# Patient Record
Sex: Male | Born: 1965 | Race: White | Hispanic: No | Marital: Married | Smoking: Former smoker
Health system: Southern US, Community
[De-identification: ages and names within clinical notes are randomized; demographics above are authoritative.]

## PROBLEM LIST (undated history)

## (undated) DIAGNOSIS — N189 Chronic kidney disease, unspecified: Secondary | ICD-10-CM

## (undated) DIAGNOSIS — E785 Hyperlipidemia, unspecified: Secondary | ICD-10-CM

## (undated) DIAGNOSIS — E781 Pure hyperglyceridemia: Secondary | ICD-10-CM

## (undated) DIAGNOSIS — E291 Testicular hypofunction: Secondary | ICD-10-CM

## (undated) DIAGNOSIS — R748 Abnormal levels of other serum enzymes: Secondary | ICD-10-CM

## (undated) DIAGNOSIS — S82899A Other fracture of unspecified lower leg, initial encounter for closed fracture: Secondary | ICD-10-CM

## (undated) DIAGNOSIS — K760 Fatty (change of) liver, not elsewhere classified: Secondary | ICD-10-CM

## (undated) DIAGNOSIS — I10 Essential (primary) hypertension: Secondary | ICD-10-CM

## (undated) DIAGNOSIS — R7301 Impaired fasting glucose: Secondary | ICD-10-CM

## (undated) DIAGNOSIS — M549 Dorsalgia, unspecified: Secondary | ICD-10-CM

## (undated) HISTORY — DX: Impaired fasting glucose: R73.01

## (undated) HISTORY — DX: Dorsalgia, unspecified: M54.9

## (undated) HISTORY — PX: BACK SURGERY: SHX140

## (undated) HISTORY — DX: Chronic kidney disease, unspecified: N18.9

## (undated) HISTORY — DX: Hyperlipidemia, unspecified: E78.5

## (undated) HISTORY — DX: Other fracture of unspecified lower leg, initial encounter for closed fracture: S82.899A

## (undated) HISTORY — DX: Essential (primary) hypertension: I10

## (undated) HISTORY — DX: Abnormal levels of other serum enzymes: R74.8

## (undated) HISTORY — DX: Testicular hypofunction: E29.1

## (undated) HISTORY — DX: Fatty (change of) liver, not elsewhere classified: K76.0

---
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
## 2008-09-17 ENCOUNTER — Ambulatory Visit: Payer: Self-pay | Admitting: Chiropractic Medicine

## 2009-10-12 ENCOUNTER — Ambulatory Visit: Payer: Self-pay | Admitting: Unknown Physician Specialty

## 2009-10-18 ENCOUNTER — Ambulatory Visit: Payer: Self-pay | Admitting: Unknown Physician Specialty

## 2010-02-07 ENCOUNTER — Encounter: Payer: Self-pay | Admitting: Unknown Physician Specialty

## 2012-05-01 ENCOUNTER — Ambulatory Visit: Payer: Self-pay | Admitting: Internal Medicine

## 2012-05-01 LAB — DOT URINE DIP
Glucose,UR: NEGATIVE mg/dL (ref 0–75)
Protein: NEGATIVE

## 2012-09-08 ENCOUNTER — Ambulatory Visit: Payer: Self-pay

## 2014-04-07 ENCOUNTER — Ambulatory Visit: Payer: Self-pay | Admitting: Physician Assistant

## 2014-04-07 LAB — DOT URINE DIP
Blood: NEGATIVE
GLUCOSE, UR: NEGATIVE mg/dL (ref 0–75)
PROTEIN: NEGATIVE
Specific Gravity: 1.01 (ref 1.003–1.030)

## 2014-10-08 ENCOUNTER — Ambulatory Visit: Payer: Self-pay | Admitting: Family Medicine

## 2015-07-29 ENCOUNTER — Other Ambulatory Visit: Payer: Self-pay | Admitting: Family Medicine

## 2015-07-29 ENCOUNTER — Telehealth: Payer: Self-pay | Admitting: Family Medicine

## 2015-07-29 NOTE — Telephone Encounter (Signed)
Routing to provider  

## 2015-07-29 NOTE — Telephone Encounter (Signed)
Left message on pt vm to call us back and schedule a f/u with labs.

## 2015-07-29 NOTE — Telephone Encounter (Signed)
Please let [facility] Premo know that I'd like to see patient for an appointment here in the office for:  HTN, high cholesterol, prediabetes, etc. Please schedule a visit with me  in the next: two weeks Fasting?  YES please Thank you, Dr. Sanda Klein

## 2015-08-02 ENCOUNTER — Encounter: Payer: Self-pay | Admitting: Family Medicine

## 2015-08-02 ENCOUNTER — Ambulatory Visit (INDEPENDENT_AMBULATORY_CARE_PROVIDER_SITE_OTHER): Payer: BLUE CROSS/BLUE SHIELD | Admitting: Family Medicine

## 2015-08-02 VITALS — BP 171/95 | HR 103 | Temp 97.9°F | Ht 71.3 in | Wt 212.0 lb

## 2015-08-02 DIAGNOSIS — M542 Cervicalgia: Secondary | ICD-10-CM | POA: Diagnosis not present

## 2015-08-02 DIAGNOSIS — IMO0001 Reserved for inherently not codable concepts without codable children: Secondary | ICD-10-CM

## 2015-08-02 DIAGNOSIS — R030 Elevated blood-pressure reading, without diagnosis of hypertension: Secondary | ICD-10-CM

## 2015-08-02 MED ORDER — TRAMADOL HCL 50 MG PO TABS: 50.0000 mg | ORAL_TABLET | Freq: Four times a day (QID) | ORAL | Status: DC | PRN

## 2015-08-02 MED ORDER — CYCLOBENZAPRINE HCL 10 MG PO TABS: 10.0000 mg | ORAL_TABLET | Freq: Three times a day (TID) | ORAL | Status: DC | PRN

## 2015-08-02 NOTE — Patient Instructions (Signed)
Trapezius Palsy  with Rehab The trapezius is a large muscle of the upper back that helps to control the shoulder blade (scapula) and stabilize the spine. Trapezius palsy is a condition affecting the nervous system in the trapezius muscle. The condition results in pain and weakness in the back of the shoulder and upper back. Shoulder function is also decreased, because the scapula contains the socket for "ball and-socket" joint of the shoulder. Trapezius palsy is caused by injury to the spinal accessory nerve, which connects to the trapezius muscle. SYMPTOMS   Pain that is achy and in the shoulder and/or upper back.  Decreased shoulder function and/or strength.  Scapula protrudes (winging of the scapula).  Back pain when sitting in a chair with a hard back due to the scapula winging and placing pressure on the back of the chair.  Shrinkage (atrophy) of the trapezius muscle, this may or may not make the neckline look asymmetric.  Shoulder drooping. CAUSES  Trapezius palsy is caused by damage to the spinal accessory nerve, which is connected to the trapezius muscle. This condition is often associated with acromioclavicular [facility]) or sternoclavicular subluxation (adjacent bones becoming out of proper alignment, but the joint surfaces are still touching). Common mechanisms of injury include:  Direct trauma to the shoulder (like being hit with an object or falling on the shoulder).  Complication of previous surgery that causes nerve damage. RISK INCREASES WITH:  Contact sports (i.e., football, rugby, or lacrosse).  Surgery around the neck.  Poor strength and flexibility. PREVENTION   Warm up and stretch properly before activity.  Maintain physical fitness:  Strength, flexibility, and endurance.  Cardiovascular fitness. PROGNOSIS  Trapezius palsy usually resolves spontaneously within 3 to 6 months. Nonsurgical (conservative) treatments may help decrease the severity of symptoms.    RELATED COMPLICATIONS   Permanent nerve damage, including pain, numbness, tingling, or weakness.  Inability to compete at a high level.  Recurrent symptoms that result in a chronic problem.  Shoulder stiffness. TREATMENT Treatment initially involves resting from any activities that aggravate the symptoms, and the use of ice and medications to help reduce pain and inflammation. The use of strengthening and stretching exercises may help reduce pain with activity. These exercises may be performed at home or with referral to a therapist. A therapist may recommend further treatment, such as:  The use of electric current to simulate the nerves (transcutaneous electronic nerve stimulation [TENS]).  Ultrasound. If symptoms are severe, your caregiver may recommend you wear a brace to decrease discomfort. If symptoms persist for more than 6 months despite nonsurgical treatment, surgery may be recommended (uncommon). MEDICATION   If pain medication is necessary, then nonsteroidal anti-inflammatory medications, such as aspirin and ibuprofen, or other minor pain relievers, such as acetaminophen, are often recommended.  Do not take pain medication for 7 days before surgery.  Prescription pain relievers may be given if deemed necessary by your caregiver. Use only as directed and only as much as you need. HEAT AND COLD  Cold treatment (icing) relieves pain and reduces inflammation. Cold treatment should be applied for 10 to 15 minutes every 2 to 3 hours for inflammation and pain and immediately after any activity that aggravates your symptoms. Use ice packs or massage the area with a piece of ice (ice massage).  Heat treatment may be used prior to performing the stretching and strengthening activities prescribed by your caregiver, physical therapist, or athletic trainer. Use a heat pack or soak your injury in warm water. SEEK  MEDICAL CARE IF:  Treatment seems to offer no benefit, or the condition  worsens.  Any medications produce adverse side effects. EXERCISES RANGE OF MOTION (ROM) AND STRETCHING EXERCISES - Trapezius Palsy (Spinal Accessory Nerve Palsy) These exercises may help you when beginning to rehabilitate your injury. Your symptoms may resolve with or without further involvement from your physician, physical therapist or athletic trainer. While completing these exercises, remember:   Restoring tissue flexibility helps normal motion to return to the joints. This allows healthier, less painful movement and activity.  An effective stretch should be held for at least 30 seconds.  A stretch should never be painful. You should only feel a gentle lengthening or release in the stretched tissue. STRETCH - Flexion, Standing  Stand with good posture. With an underhand grip on your right / left and an overhand grip on the opposite hand, grasp a broomstick or cane so that your hands are a little more than shoulder-width apart.  Keeping your right / left elbow straight and shoulder muscles relaxed, push the stick with your opposite hand to raise your right / left arm in front of your body and then overhead. Raise your arm until you feel a stretch in your right / left shoulder, but before you have increased shoulder pain.  Try to avoid shrugging your right / left shoulder as your arm rises by keeping your shoulder blade tucked down and toward your mid-back spine. Hold __________ seconds.  Slowly return to the starting position. Repeat __________ times. Complete this exercise __________ times per day.  STRETCH - Abduction, Supine  Stand with good posture. With an underhand grip on your right / left and an overhand grip on the opposite hand, grasp a broomstick or cane so that your hands are a little more than shoulder-width apart.  Keeping your right / left elbow straight and shoulder muscles relaxed, push the stick with your opposite hand to raise your right / left arm out to the side of  your body and then overhead. Raise your arm until you feel a stretch in your right / left shoulder, but before you have increased shoulder pain.  Try to avoid shrugging your right / left shoulder as your arm rises by keeping your shoulder blade tucked down and toward your mid-back spine. Hold __________ seconds.  Slowly return to the starting position. Repeat __________ times. Complete this exercise __________ times per day.  ROM - Flexion, Active-Assisted  Lie on your back. You may bend your knees for comfort.  Grasp a broomstick or cane so your hands are about shoulder-width apart. Your right / left hand should grip the end of the stick/cane so that your hand is positioned "thumbs-up," as if you were about to shake hands.  Using your healthy arm to lead, raise your right / left arm overhead until you feel a gentle stretch in your shoulder. Hold __________ seconds.  Use the stick/cane to assist in returning your right / left arm to its starting position. Repeat __________ times. Complete this exercise __________ times per day.  STRETCH - External Rotation and Abduction  Stagger your stance through a doorframe. It does not matter which foot is forward.  Choose one of the following positions as instructed by your physician, physical therapist or athletic trainer: place your hands:  and forearms above your head and on the door frame.  and forearms at head-height and on the door frame.  at elbow-height and on the door frame.  Keeping your head and chest  upright and your stomach muscles tight to prevent over-extending your low-back, slowly shift your weight onto your front foot until you feel a stretch across your chest and/or in the front of your shoulders.  Hold __________ seconds. Shift your weight to your back foot to release the stretch. Repeat __________ times. Complete this stretch __________ times per day.  STRENGTHENING EXERCISES - Trapezius Palsy (Spinal Accessory Nerve  Palsy) These exercises may help you when beginning to rehabilitate your injury. They may resolve your symptoms with or without further involvement from your physician, physical therapist or athletic trainer. While completing these exercises, remember:   Muscles can gain both the endurance and the strength needed for everyday activities through controlled exercises.  Complete these exercises as instructed by your physician, physical therapist or athletic trainer. Progress with the resistance and repetition exercises only as your caregiver advises.  You may experience muscle soreness or fatigue, but the pain or discomfort you are trying to eliminate should never worsen during these exercises. If this pain does worsen, stop and make certain you are following the directions exactly. If the pain is still present after adjustments, discontinue the exercise until you can discuss the trouble with your clinician. STRENGTH - Scapular Depression and Adduction  With good posture, sit on a firm chair. Support your arms in front of you with pillows, arm rests or a table top. Have your elbows in line with the sides of your body.  Gently draw your shoulder blades down and toward your mid-back spine. Gradually increase the tension without tensing the muscles along the top of your shoulders and the back of your neck.  Hold for __________ seconds. Slowly release the tension and relax your muscles completely before completing the next repetition.  After you have practiced this exercise, remove the arm support and complete it while standing as well as sitting. Repeat __________ times. Complete this exercise __________ times per day.  STRENGTH - Shoulder Abductors, Isometric   With good posture, stand or sit about 4-6 inches from a wall with your right / left side facing the wall.  Bend your right / left elbow. Gently press your right / left elbow into the wall. Increase the pressure gradually until you are pressing  as hard as you can without shrugging your shoulder or increasing any shoulder discomfort.  Hold __________ seconds.  Release the tension slowly. Relax your shoulder muscles completely before you do the next repetition. Repeat __________ times. Complete this exercise __________ times per day.  STRENGTH - Shoulder Flexion, Isometric  With good posture and facing a wall, stand or sit about 4-6 inches away.  Keeping your right / left elbow straight, gently press the top of your fist into the wall. Increase the pressure gradually until you are pressing as hard as you can without shrugging your shoulder or increasing any shoulder discomfort.  Hold __________ seconds.  Release the tension slowly. Relax your shoulder muscles completely before you do the next repetition. Repeat __________ times. Complete this exercise __________ times per day.  STRENGTH - Internal Rotators  Secure a rubber exercise band/tubing to a fixed object so that it is at the same height as your right / left elbow when you are standing or sitting on a firm surface.  Stand or sit so that the secured exercise band/tubing is at your right / left side.  Bend your elbow 90 degrees. Place a folded towel or small pillow under your right / left arm so that your elbow is a  few inches away from your side.  Keeping the tension on the exercise band/tubing, pull it across your body toward your abdomen. Be sure to keep your body steady so that the movement is only coming from your shoulder rotating.  Hold __________ seconds. Release the tension in a controlled manner as you return to the starting position. Repeat __________ times. Complete this exercise __________ times per day.  STRENGTH - External Rotators  Secure a rubber exercise band/tubing to a fixed object so that it is at the same height as your right / left elbow when you are standing or sitting on a firm surface.  Stand or sit so that the secured exercise band/tubing is at  your side that is not injured.  Bend your elbow 90 degrees. Place a folded towel or small pillow under your right / left arm so that your elbow is a few inches away from your side.  Keeping the tension on the exercise band/tubing, pull it away from your body, as if pivoting on your elbow. Be sure to keep your body steady so that the movement is only coming from your shoulder rotating.  Hold __________ seconds. Release the tension in a controlled manner as you return to the starting position. Repeat __________ times. Complete this exercise __________ times per day.  STRENGTH - Shoulder Extensors  Secure a rubber exercise band/tubing so that it is at the height of your shoulders when you are either standing or sitting on a firm arm-less chair.  With a thumbs-up grip, grasp an end of the band/tubing in each hand. Straighten your elbows and lift your hands straight in front of you at shoulder height. Step back away from the secured end of band/tubing until it becomes tense.  Squeezing your shoulder blades together, pull your hands down to the sides of your thighs. Do not allow your hands to go behind you.  Hold for __________ seconds. Slowly ease the tension on the band/tubing as you reverse the directions and return to the starting position. Repeat __________ times. Complete this exercise __________ times per day.  STRENGTH - Shoulder Extensors, Prone  Lie on your stomach on a firm surface so that your right / left arm overhangs the edge. Rest your forehead on your opposite forearm. With your thumb facing away from your body and your elbow straight, hold a __________ weight in your hand.  Squeeze your right / left shoulder blade to your mid-back spine and then slowly raise your arm behind you to the height of the bed.  Hold for __________ seconds. Slowly reverse the directions and return to the starting position, controlling the weight as you lower your arm. Repeat __________ times. Complete  this exercise __________ times per day.  STRENGTH - Horizontal Abductors Choose one of the two oppositions to complete this exercise. Prone (lying on stomach):  Lie on your stomach on a firm surface so that your right / left arm overhangs the edge. Rest your forehead on your opposite forearm. With your palm facing the floor and your elbow straight, hold a __________ weight in your hand.  Squeeze your right / left shoulder blade to your mid-back spine and then slowly raise your arm to the height of the bed.  Hold for __________ seconds. Slowly reverse the directions and return to the starting position, controlling the weight as you lower your arm. Repeat __________ times. Complete this exercise __________ times per day. Standing:   Secure a rubber exercise band/tubing so that it is at the  height of your shoulders when you are either standing or sitting on a firm arm-less chair.  Grasp an end of the band/tubing in each hand and have your palms face each other. Straighten your elbows and lift your hands straight in front of you at shoulder height. Step back away from the secured end of band/tubing until it becomes tense.  Squeeze your shoulder blades together. Keeping your elbows locked and your hands at shoulder-height, bring your hands out to your side.  Hold __________ seconds. Slowly ease the tension on the band/tubing as you reverse the directions and return to the starting position. Repeat __________ times. Complete this exercise __________ times per day. STRENGTH - Scapular Retractors and Elevators  Secure a rubber exercise band/tubing so that it is at the height of your shoulders when you are either standing or sitting on a firm arm-less chair.  With a thumbs-up grip, grasp an end of the band/tubing in each hand. Step back away from the secured end of band/tubing until it becomes tense.  Squeezing your shoulder blades together, straighten your elbows and lift your hands straight over  your head.  Hold for __________ seconds. Slowly ease the tension on the band/tubing as you reverse the directions and return to the starting position. Repeat __________ times. Complete this exercise __________ times per day.  Document Released: 11/05/2005 Document Revised: 03/22/2014 Document Reviewed: 02/17/2009 [facility] Patient Information 2015 [place], [place]. This information is not intended to replace advice given to you by your health care provider. Make sure you discuss any questions you have with your health care provider.

## 2015-08-02 NOTE — Progress Notes (Signed)
BP 171/95 mmHg  Pulse 103  Temp(Src) 97.9 F (36.6 C)  Ht 5' 11.3" (1.811 m)  Wt 212 lb (96.163 kg)  BMI 29.32 kg/m2  SpO2 96%   Subjective:    Patient ID: Jesus Barber, male    DOB: July 25, 1966, 49 y.o.   MRN: [id number]  HPI: Jesus Barber is a 49 y.o. male  Chief Complaint  Patient presents with  . Neck Pain    patient has been in pain for 3 weeks,it is in his neck down his right arm, he has went to physical therapy which he believes caused this and a chiropractor nothing has helped. 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 Shoulder is better, neck hurting with pain down his arm. Saw a chiropractor who did electrostim, and the accupuncture/dry needling- didn't help at all Status: stable Treatments attempted: rest, ice, heat, APAP, ibuprofen, aleve and physical therapy for the arm, votaren gel  Compliant with recommended treatment: yes Relief with NSAIDs?:  no Location:Right Duration: 3 weeks Severity: severe Quality: sharp and aching Frequency: constant Radiation: R arm Aggravating factors: lifting, movement, walking, laying and bending Alleviating factors: nothing Weakness:  yes Paresthesias / decreased sensation:  yes  Fevers:  no  Relevant past medical, surgical, family and social history reviewed and updated as indicated. Interim medical history since our last visit reviewed. Allergies and medications reviewed and updated.  Review of Systems  Constitutional: Negative.   Respiratory: Negative.   Cardiovascular: Negative.   Musculoskeletal: Positive for myalgias and neck pain. Negative for back pain, joint swelling,  arthralgias and gait problem.  Psychiatric/Behavioral: Negative.     Per HPI unless specifically indicated above     Objective:    BP 171/95 mmHg  Pulse 103  Temp(Src) 97.9 F (36.6 C)  Ht 5' 11.3" (1.811 m)  Wt 212 lb (96.163 kg)  BMI 29.32 kg/m2  SpO2 96%  Wt Readings from Last 3 Encounters:  08/02/15 212 lb (96.163 kg)  12/31/14 214 lb (97.07 kg)    Physical Exam  Constitutional: He is oriented to person, place, and time. He appears well-developed and well-nourished. No distress.  HENT:  Head: Normocephalic and atraumatic.  Right Ear: Hearing normal.  Left Ear: Hearing normal.  Nose: Nose normal.  Eyes: Conjunctivae and lids are normal. Right eye exhibits no discharge. Left eye exhibits no discharge. No scleral icterus.  Cardiovascular: Normal rate, regular rhythm, normal heart sounds and intact distal pulses.  Exam reveals no gallop and no friction rub.   No murmur heard. Pulmonary/Chest: Effort normal and breath sounds normal. No respiratory distress. He has no wheezes. He has no rales. He exhibits no tenderness.  Neurological: He is alert and oriented to person, place, and time.  Skin: Skin is intact. No rash noted.  Psychiatric: He has a normal mood and affect. His speech is normal and behavior is normal. Judgment and thought content normal. Cognition and memory are normal.  Nursing note and vitals reviewed.  Neck Exam:    Tenderness to Palpation: yes    Midline cervical spine: no    Paraspinal neck musculature: no  Trapezius: yes    Sternocleidomastoid: no     Range of Motion:     Flexion: Decreased    Extension: Normal    Lateral rotation: Decreased  To the R    Lateral bending: Decreased to the R     Neuro Examination: Upper extremity DTRs normal & symmetric.  Strength and sensation intact.    Special Tests:     Spurling test: negativve  Results for orders placed or performed in visit on 04/07/14  DOT Urine Dip  Result Value Ref Range   Specific  Gravity 1.010 1.003-1.030   Glucose,UR NEGATIVE 0-75 mg/dL   Protein NEGATIVE NEGATIVE   Blood NEGATIVE NEGATIVE      Assessment & Plan:   Problem List Items Addressed This Visit    None    Visit Diagnoses    Neck pain on right side    -  Primary    Appears to be muscle spasm of trap and rhomboid- will start flexeril and PRN tramadol for breakthrough. Stretches given. Call if not getting better in a week.    Elevated blood pressure        Likely due to pain. Due for his regular appointment shortly, follow up on BP at that time (1 week). Work on decreasing salt.        Follow up plan: Return As scheduled.

## 2015-08-03 ENCOUNTER — Other Ambulatory Visit: Payer: Self-pay

## 2015-08-03 NOTE — Telephone Encounter (Signed)
Called and let patient know what Dr. Sanda Klein said.

## 2015-08-03 NOTE — Telephone Encounter (Signed)
Let patient know that we'll stop this for now; new recommendations about using this medicine with statin We'll discuss what's best for him at appt

## 2015-08-03 NOTE — Telephone Encounter (Signed)
Patient has appointment with Dr.Lada on 08/12/15 and pharmacy is [facility] in [place].

## 2015-08-09 ENCOUNTER — Other Ambulatory Visit: Payer: Self-pay

## 2015-08-09 NOTE — Telephone Encounter (Signed)
Let patient know that I'm stopping his fibric acid derivative, the fenofibrate (the cholesterol medicine that is not atorvastatin, the other one) We'll talk about why at his appt Come fasting for labs please

## 2015-08-09 NOTE — Telephone Encounter (Signed)
Left message to call.

## 2015-08-11 NOTE — Telephone Encounter (Signed)
Left message to call.

## 2015-08-12 ENCOUNTER — Encounter: Payer: Self-pay | Admitting: Family Medicine

## 2015-08-12 ENCOUNTER — Ambulatory Visit (INDEPENDENT_AMBULATORY_CARE_PROVIDER_SITE_OTHER): Payer: BLUE CROSS/BLUE SHIELD | Admitting: Family Medicine

## 2015-08-12 VITALS — BP 142/94 | HR 102 | Temp 98.5°F | Ht 71.75 in | Wt 211.0 lb

## 2015-08-12 DIAGNOSIS — I10 Essential (primary) hypertension: Secondary | ICD-10-CM

## 2015-08-12 DIAGNOSIS — E291 Testicular hypofunction: Secondary | ICD-10-CM

## 2015-08-12 DIAGNOSIS — E785 Hyperlipidemia, unspecified: Secondary | ICD-10-CM | POA: Insufficient documentation

## 2015-08-12 DIAGNOSIS — E663 Overweight: Secondary | ICD-10-CM | POA: Insufficient documentation

## 2015-08-12 DIAGNOSIS — R740 Nonspecific elevation of levels of transaminase and lactic acid dehydrogenase [LDH]: Secondary | ICD-10-CM | POA: Diagnosis not present

## 2015-08-12 DIAGNOSIS — N289 Disorder of kidney and ureter, unspecified: Secondary | ICD-10-CM | POA: Diagnosis not present

## 2015-08-12 DIAGNOSIS — M25511 Pain in right shoulder: Secondary | ICD-10-CM | POA: Insufficient documentation

## 2015-08-12 DIAGNOSIS — R7401 Elevation of levels of liver transaminase levels: Secondary | ICD-10-CM | POA: Insufficient documentation

## 2015-08-12 DIAGNOSIS — R7989 Other specified abnormal findings of blood chemistry: Secondary | ICD-10-CM

## 2015-08-12 MED ORDER — AMLODIPINE BESYLATE 5 MG PO TABS: 5.0000 mg | ORAL_TABLET | Freq: Every day | ORAL | Status: DC

## 2015-08-12 MED ORDER — CYCLOBENZAPRINE HCL 10 MG PO TABS: 10.0000 mg | ORAL_TABLET | Freq: Three times a day (TID) | ORAL | Status: DC | PRN

## 2015-08-12 MED ORDER — TRAMADOL HCL 50 MG PO TABS: 50.0000 mg | ORAL_TABLET | Freq: Four times a day (QID) | ORAL | Status: DC | PRN

## 2015-08-12 NOTE — Progress Notes (Signed)
BP 142/94 mmHg  Pulse 102  Temp(Src) 98.5 F (36.9 C)  Ht 5' 11.75" (1.822 m)  Wt 211 lb (95.709 kg)  BMI 28.83 kg/m2  SpO2 96%   Subjective:    Patient ID: Jesus Barber, male    DOB: 04-14-1966, 49 y.o.   MRN: [id number]  HPI: Jesus Barber is a 49 y.o. male  Chief Complaint  Patient presents with  . Hyperlipidemia    Patient was just notified this morning to discontinue his fenofibrate.   high cholesterol; just stopped the fenofibrate at my recommendations; eats 5-6 eggs a week; eats a lot of fast food with his job; not planning and making his health priority one; he could take stuff to work  High blood pressure; BP has been high for a while; used to be following that, was put on medicine several years ago and it wiped him out  Overweight; lost 3 pounds since last visit; was walking and jogging a little until he hurt his right shoulder  He saw Dr. Wynetta Emery for pinched nerve, shoulder pain; some numbness in the right UE; medicine is helping some; doing exercises  Low testosterone; we reviewed old labs and he was having fatigue at that time and it was checked; he was also on atenolol though and he quit taking that; he denies testicular atrophy, muscle weakness (with the exception of the right shoulder problem)  Relevant past medical, surgical, family and social history reviewed and updated as indicated. Interim medical history since our last visit reviewed. Allergies and medications reviewed and updated.  Review of Systems  Per HPI unless specifically indicated above     Objective:    BP 142/94 mmHg  Pulse 102  Temp(Src) 98.5 F (36.9 C)  Ht 5' 11.75" (1.822 m)  Wt 211 lb (95.709 kg)  BMI 28.83 kg/m2  SpO2 96%  Wt Readings from Last 3 Encounters:  08/12/15 211 lb (95.709 kg)  08/02/15 212 lb (96.163 kg)  12/31/14 214 lb (97.07 kg)    Physical Exam  Constitutional: He appears well-developed and well-nourished. No distress.  HENT:  Head:  Normocephalic and atraumatic.  Eyes: EOM are normal. No scleral icterus.  Neck: No thyromegaly present.  Cardiovascular: Normal rate and regular rhythm.   Pulmonary/Chest: Effort normal and breath sounds normal.  Abdominal: Soft. Bowel sounds are normal. He exhibits no distension.  Musculoskeletal: He exhibits no edema.  Neurological: Coordination normal.  Skin: Skin is warm and dry. No pallor.  Psychiatric: He has a normal mood and affect. His behavior is normal. Judgment and thought content normal.      Assessment & Plan:   Problem List Items Addressed This Visit      Cardiovascular and Mediastinum   Essential hypertension, benign - Primary    Reviewed prior BPs; discussed HTN; he agrees to start medicine; ideally, he will work on weight loss and DASH guidelines and we can perhaps discontinue this medicine over the coming months as his BMI comes down; limit salt as part of overall DASH program; see AVS      Relevant Medications   amLODipine (NORVASC) 5 MG tablet   Other Relevant Orders   Comprehensive metabolic panel (Completed)     Genitourinary   Renal insufficiency    Recheck creatinine and GFR        Other   Dyslipidemia    Check fasting lipids today; discussed risk of taking both statin plus fibric acid derivative; will plan on care once labs are back,  but for now he'll hold the fibric acid derivative; weight loss and healthy diet encouraged      Relevant Orders   Lipid Panel w/o Chol/HDL Ratio (Completed)   Overweight (BMI 25.0-29.9)    Encouraged weight loss      Elevated serum glutamic pyruvic transaminase (SGPT) level    Recheck level; likely related to cholesterol and weight      Shoulder pain, right    Followed by Dr. Wynetta Emery      Low serum testosterone level    Patient was not interested in checking level today; relatively asymptomatic         Follow up plan: Return in about 3 months (around 11/11/2015) for cholesterol, blood pressure, please  come fasting.  An after-visit summary was printed and given to the patient at [place].  Please see the patient instructions which may contain other information and recommendations beyond what is mentioned above in the assessment and plan.  Orders Placed This Encounter  Procedures  . Comprehensive metabolic panel  . Lipid Panel w/o Chol/HDL Ratio

## 2015-08-12 NOTE — Telephone Encounter (Signed)
Patient came in for follow up appointment. He was notified.

## 2015-08-12 NOTE — Patient Instructions (Addendum)
Try to limit eggs to no more than three per week Try to get more fiber and water (38 grams or more of fiber a day and 64 ounces of water) Start the new blood pressure medicine, amlodipine Return for visit and fasting labs in 3 months Start fish oil or krill oil, two capsules twice a day Really work on weight loss and healthier diet Check out the information at [employer] entitled "What It Takes to Lose Weight" Try to lose between 1-2 pounds per week by taking in fewer calories and burning off more calories You can succeed by limiting portions, limiting foods dense in calories and fat, becoming more active, and drinking 8 glasses of water a day Don't skip meals, especially breakfast, as skipping meals may alter your metabolism Do not use over-the-counter weight loss pills or gimmicks that claim rapid weight loss A healthy BMI (or body mass index) is between 18.5 and 24.9 You can calculate your ideal BMI at the [place] website ClubMonet[employer].fr Your goal blood pressure is less than 140 mmHg on top. Try to follow the DASH guidelines (DASH stands for Dietary Approaches to Stop Hypertension) Try to limit the sodium in your diet.  Ideally, consume less than 1.5 grams (less than 1,500mg ) per day. Do not add salt when cooking or at the table.  Check the sodium amount on labels when shopping, and choose items lower in sodium when given a choice. Avoid or limit foods that already contain a lot of sodium. Eat a diet rich in fruits and vegetables and whole grains.   DASH Eating Plan DASH stands for "Dietary Approaches to Stop Hypertension." The DASH eating plan is a healthy eating plan that has been shown to reduce high blood pressure (hypertension). Additional health benefits may include reducing the risk of type 2 diabetes mellitus, heart disease, and stroke. The DASH eating plan may also help with weight loss. WHAT DO I NEED TO KNOW ABOUT THE DASH  EATING PLAN? For the DASH eating plan, you will follow these general guidelines:  Choose foods with a percent daily value for sodium of less than 5% (as listed on the food label).  Use salt-free seasonings or herbs instead of table salt or sea salt.  Check with your health care provider or pharmacist before using salt substitutes.  Eat lower-sodium products, often labeled as "lower sodium" or "no salt added."  Eat fresh foods.  Eat more vegetables, fruits, and low-fat dairy products.  Choose whole grains. Look for the word "whole" as the first word in the ingredient list.  Choose fish and skinless chicken or [place] more often than red meat. Limit fish, poultry, and meat to 6 oz (170 g) each day.  Limit sweets, desserts, sugars, and sugary drinks.  Choose heart-healthy fats.  Limit cheese to 1 oz (28 g) per day.  Eat more home-cooked food and less restaurant, buffet, and fast food.  Limit fried foods.  Cook foods using methods other than frying.  Limit canned vegetables. If you do use them, rinse them well to decrease the sodium.  When eating at a restaurant, ask that your food be prepared with less salt, or no salt if possible. WHAT FOODS CAN I EAT? Seek help from a dietitian for individual calorie needs. Grains Whole grain or whole wheat bread. Brown rice. Whole grain or whole wheat pasta. Quinoa, bulgur, and whole grain cereals. Low-sodium cereals. Corn or whole wheat flour tortillas. Whole grain cornbread. Whole grain crackers. Low-sodium crackers. Vegetables Fresh or frozen vegetables (  raw, steamed, roasted, or grilled). Low-sodium or reduced-sodium tomato and vegetable juices. Low-sodium or reduced-sodium tomato sauce and paste. Low-sodium or reduced-sodium canned vegetables.  Fruits All fresh, canned (in natural juice), or frozen fruits. Meat and Other Protein Products Ground beef (85% or leaner), grass-fed beef, or beef trimmed of fat. Skinless chicken or [place].  Ground chicken or [place]. Pork trimmed of fat. All fish and seafood. Eggs. Dried beans, peas, or lentils. Unsalted nuts and seeds. Unsalted canned beans. Dairy Low-fat dairy products, such as skim or 1% milk, 2% or reduced-fat cheeses, low-fat ricotta or cottage cheese, or plain low-fat yogurt. Low-sodium or reduced-sodium cheeses. Fats and Oils Tub margarines without trans fats. Light or reduced-fat mayonnaise and salad dressings (reduced sodium). Avocado. Safflower, olive, or canola oils. Natural peanut or almond butter. Other Unsalted popcorn and pretzels. The items listed above may not be a complete list of recommended foods or beverages. Contact your dietitian for more options. WHAT FOODS ARE NOT RECOMMENDED? Grains White bread. White pasta. White rice. Refined cornbread. Bagels and croissants. Crackers that contain trans fat. Vegetables Creamed or fried vegetables. Vegetables in a cheese sauce. Regular canned vegetables. Regular canned tomato sauce and paste. Regular tomato and vegetable juices. Fruits Dried fruits. Canned fruit in light or heavy syrup. Fruit juice. Meat and Other Protein Products Fatty cuts of meat. Ribs, chicken wings, bacon, sausage, bologna, salami, chitterlings, fatback, hot dogs, bratwurst, and packaged luncheon meats. Salted nuts and seeds. Canned beans with salt. Dairy Whole or 2% milk, cream, half-and-half, and cream cheese. Whole-fat or sweetened yogurt. Full-fat cheeses or blue cheese. Nondairy creamers and whipped toppings. Processed cheese, cheese spreads, or cheese curds. Condiments Onion and garlic salt, seasoned salt, table salt, and sea salt. Canned and packaged gravies. Worcestershire sauce. Tartar sauce. Barbecue sauce. Teriyaki sauce. Soy sauce, including reduced sodium. Steak sauce. Fish sauce. Oyster sauce. Cocktail sauce. Horseradish. Ketchup and mustard. Meat flavorings and tenderizers. Bouillon cubes. Hot sauce. Tabasco sauce. Marinades. Taco  seasonings. Relishes. Fats and Oils Butter, stick margarine, lard, shortening, ghee, and bacon fat. Coconut, palm kernel, or palm oils. Regular salad dressings. Other Pickles and olives. Salted popcorn and pretzels. The items listed above may not be a complete list of foods and beverages to avoid. Contact your dietitian for more information. WHERE CAN I FIND MORE INFORMATION? National Heart, Lung, and Blood Institute: travelstabloid.com Document Released: 10/25/2011 Document Revised: 03/22/2014 Document Reviewed: 09/09/2013 [facility] Patient Information 2015 [place], [place]. This information is not intended to replace advice given to you by your health care provider. Make sure you discuss any questions you have with your health care provider. Cholesterol Cholesterol is a white, waxy, fat-like substance needed by your body in small amounts. The liver makes all the cholesterol you need. Cholesterol is carried from the liver by the blood through the blood vessels. Deposits of cholesterol (plaque) may build up on blood vessel walls. These make the arteries narrower and stiffer. Cholesterol plaques increase the risk for heart attack and stroke.  You cannot feel your cholesterol level even if it is very high. The only way to know it is high is with a blood test. Once you know your cholesterol levels, you should keep a record of the test results. Work with your health care provider to keep your levels in the desired range.  WHAT DO THE RESULTS MEAN?  Total cholesterol is a rough measure of all the cholesterol in your blood.   LDL is the so-called bad cholesterol. This is the type  that deposits cholesterol in the walls of the arteries. You want this level to be low.   HDL is the good cholesterol because it cleans the arteries and carries the LDL away. You want this level to be high.  Triglycerides are fat that the body can either burn for energy or store. High levels  are closely linked to heart disease.  WHAT ARE THE DESIRED LEVELS OF CHOLESTEROL?  Total cholesterol below 200.   LDL below 100 for people at risk, below 70 for those at very high risk.   HDL above 50 is good, above 60 is best.   Triglycerides below 150.  HOW CAN I LOWER MY CHOLESTEROL?  Diet. Follow your diet programs as directed by your health care provider.   Choose fish or white meat chicken and [place], roasted or baked. Limit fatty cuts of red meat, fried foods, and processed meats, such as sausage and lunch meats.   Eat lots of fresh fruits and vegetables.  Choose whole grains, beans, pasta, potatoes, and cereals.   Use only small amounts of olive, corn, or canola oils.   Avoid butter, mayonnaise, shortening, or palm kernel oils.  Avoid foods with trans fats.   Drink skim or nonfat milk and eat low-fat or nonfat yogurt and cheeses. Avoid whole milk, cream, ice cream, egg yolks, and full-fat cheeses.   Healthy desserts include angel food cake, ginger snaps, animal crackers, hard candy, popsicles, and low-fat or nonfat frozen yogurt. Avoid pastries, cakes, pies, and cookies.   Exercise. Follow your exercise programs as directed by your health care provider.   A regular program helps decrease LDL and raise HDL.   A regular program helps with weight control.   Do things that increase your activity level like gardening, walking, or taking the stairs. Ask your health care provider about how you can be more active in your daily life.   Medicine. Take medicine only as directed by your health care provider.   Medicine may be prescribed by your health care provider to help lower cholesterol and decrease the risk for heart disease.   If you have several risk factors, you may need medicine even if your levels are normal. Document Released: 07/31/2001 Document Revised: 03/22/2014 Document Reviewed: 08/19/2013 [facility] Patient Information 2015 [place], [place].  This information is not intended to replace advice given to you by your health care provider. Make sure you discuss any questions you have with your health care provider.

## 2015-08-13 LAB — COMPREHENSIVE METABOLIC PANEL
A/G RATIO: 2.2 (ref 1.1–2.5)
ALT: 86 IU/L — ABNORMAL HIGH (ref 0–44)
AST: 48 IU/L — AB (ref 0–40)
Albumin: 4.7 g/dL (ref 3.5–5.5)
Alkaline Phosphatase: 80 IU/L (ref 39–117)
BUN/Creatinine Ratio: 10 (ref 9–20)
BUN: 12 mg/dL (ref 6–24)
Bilirubin Total: 0.5 mg/dL (ref 0.0–1.2)
CALCIUM: 9.5 mg/dL (ref 8.7–10.2)
CO2: 21 mmol/L (ref 18–29)
Chloride: 104 mmol/L (ref 97–108)
Creatinine, Ser: 1.19 mg/dL (ref 0.76–1.27)
GFR, EST AFRICAN AMERICAN: 83 mL/min/{1.73_m2} (ref >59)
GFR, EST NON AFRICAN AMERICAN: 72 mL/min/{1.73_m2} (ref >59)
GLOBULIN, TOTAL: 2.1 g/dL (ref 1.5–4.5)
Glucose: 86 mg/dL (ref 65–99)
POTASSIUM: 4.5 mmol/L (ref 3.5–5.2)
SODIUM: 145 mmol/L — AB (ref 134–144)
Total Protein: 6.8 g/dL (ref 6.0–8.5)

## 2015-08-13 LAB — LIPID PANEL W/O CHOL/HDL RATIO
Cholesterol, Total: 236 mg/dL — ABNORMAL HIGH (ref 100–199)
HDL: 27 mg/dL — ABNORMAL LOW (ref >39)
TRIGLYCERIDES: 829 mg/dL — AB (ref 0–149)

## 2015-08-14 DIAGNOSIS — R7989 Other specified abnormal findings of blood chemistry: Secondary | ICD-10-CM | POA: Insufficient documentation

## 2015-08-14 NOTE — Assessment & Plan Note (Addendum)
Recheck level; likely related to cholesterol and weight

## 2015-08-14 NOTE — Assessment & Plan Note (Signed)
Check fasting lipids today; discussed risk of taking both statin plus fibric acid derivative; will plan on care once labs are back, but for now he'll hold the fibric acid derivative; weight loss and healthy diet encouraged

## 2015-08-14 NOTE — Assessment & Plan Note (Signed)
Encouraged weight loss 

## 2015-08-14 NOTE — Assessment & Plan Note (Signed)
Patient was not interested in checking level today; relatively asymptomatic

## 2015-08-14 NOTE — Assessment & Plan Note (Signed)
Recheck creatinine and GFR

## 2015-08-14 NOTE — Assessment & Plan Note (Signed)
Reviewed prior BPs; discussed HTN; he agrees to start medicine; ideally, he will work on weight loss and DASH guidelines and we can perhaps discontinue this medicine over the coming months as his BMI comes down; limit salt as part of overall DASH program; see AVS

## 2015-08-14 NOTE — Assessment & Plan Note (Signed)
Followed by Dr. Wynetta Emery

## 2015-08-19 NOTE — Telephone Encounter (Signed)
Patient has already been seen; I'm closing this note out

## 2015-08-22 ENCOUNTER — Telehealth: Payer: Self-pay | Admitting: Family Medicine

## 2015-08-22 MED ORDER — FISH OIL 1200 MG PO CAPS: 2.0000 | ORAL_CAPSULE | Freq: Two times a day (BID) | ORAL | Status: DC

## 2015-08-22 NOTE — Telephone Encounter (Signed)
I talked to patient; discussed FDA change in recommendations, no longer recommending fenofibrates with statins; will have him use fish oil 2 capsules BID and really work on weight loss and better eating; continue statin; mild elev of LFTs most likely fatty liver with his TG; recheck labs in 3 months, try to be good around holidays; glucose normal, kidney function good

## 2015-09-14 ENCOUNTER — Other Ambulatory Visit: Payer: Self-pay

## 2015-09-16 MED ORDER — ATORVASTATIN CALCIUM 40 MG PO TABS: 40.0000 mg | ORAL_TABLET | Freq: Every day | ORAL | Status: DC

## 2015-09-16 NOTE — Telephone Encounter (Signed)
Sent; using just statin now, not combination with fibric acid derivative

## 2015-09-16 NOTE — Telephone Encounter (Signed)
Rx fax request attempt # 2.   LAST VISIT: 08/12/2015 PATIENT HAS AN UPCOMING APPOINTMENT 11/15/2015  Request for Atorvastatin 40 mg tablet.

## 2015-11-09 ENCOUNTER — Other Ambulatory Visit: Payer: Self-pay | Admitting: Family Medicine

## 2015-11-09 DIAGNOSIS — R7401 Elevation of levels of liver transaminase levels: Secondary | ICD-10-CM

## 2015-11-09 DIAGNOSIS — R740 Nonspecific elevation of levels of transaminase and lactic acid dehydrogenase [LDH]: Secondary | ICD-10-CM

## 2015-11-09 DIAGNOSIS — E785 Hyperlipidemia, unspecified: Secondary | ICD-10-CM

## 2015-11-09 NOTE — Telephone Encounter (Signed)
Routing to provider  

## 2015-11-10 NOTE — Assessment & Plan Note (Signed)
Due for labs soon; one month Rx sent in

## 2015-11-10 NOTE — Telephone Encounter (Signed)
Rx sent; new orders entered for lipids and LFTs to be done next month

## 2015-11-10 NOTE — Assessment & Plan Note (Signed)
Recheck LFTs soon

## 2015-11-15 ENCOUNTER — Ambulatory Visit: Payer: BLUE CROSS/BLUE SHIELD | Admitting: Family Medicine

## 2015-12-02 ENCOUNTER — Encounter: Payer: Self-pay | Admitting: Family Medicine

## 2015-12-02 ENCOUNTER — Ambulatory Visit (INDEPENDENT_AMBULATORY_CARE_PROVIDER_SITE_OTHER): Payer: BLUE CROSS/BLUE SHIELD | Admitting: Family Medicine

## 2015-12-02 VITALS — BP 137/83 | HR 62 | Temp 97.6°F | Wt 215.0 lb

## 2015-12-02 DIAGNOSIS — N289 Disorder of kidney and ureter, unspecified: Secondary | ICD-10-CM

## 2015-12-02 DIAGNOSIS — E785 Hyperlipidemia, unspecified: Secondary | ICD-10-CM | POA: Diagnosis not present

## 2015-12-02 DIAGNOSIS — R740 Nonspecific elevation of levels of transaminase and lactic acid dehydrogenase [LDH]: Secondary | ICD-10-CM | POA: Diagnosis not present

## 2015-12-02 DIAGNOSIS — F1010 Alcohol abuse, uncomplicated: Secondary | ICD-10-CM

## 2015-12-02 DIAGNOSIS — R7401 Elevation of levels of liver transaminase levels: Secondary | ICD-10-CM

## 2015-12-02 DIAGNOSIS — I10 Essential (primary) hypertension: Secondary | ICD-10-CM

## 2015-12-02 NOTE — Assessment & Plan Note (Signed)
Check LFTs 

## 2015-12-02 NOTE — Progress Notes (Signed)
BP 137/83 mmHg  Pulse 62  Temp(Src) 97.6 F (36.4 C)  Wt 215 lb (97.523 kg)  SpO2 97%   Subjective:    Patient ID: Jesus Barber, male    DOB: 08-21-1966, 50 y.o.   MRN: [id number]  HPI: Jesus Barber is a 50 y.o. male  Chief Complaint  Patient presents with  . Hypertension    follow up, was started on BP med at last visit  . 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; he may do more advanced planning  He has high blood pressure and was started on a blood pressure medicine (CCB) last visit; no problems with the new blood rpessure medicine; he missed it for about a week; doing well, misses a dose here or there due to work schedule; no ankle or leg edema, no constipation; not using much salt; not changed eating habits like he should   Mildly elevated LFTs, likely fatty liver and high TG; he denies any risk factors for Hep B or C  He has been drinking more alcohol and wanted to bring that up; he thinks that is why maybe his last liver tests were abnormal; he used to drink here and there; sometimes gets a short fuse; snaps at things; drinking more than he used to now; sleeping well; not exercising enough; used to take a multivtamin until 2 weeks ago; wife asked him to talk about this; does not need counseling he thinks  Relevant past medical, surgical, family and social history reviewed and updated as indicated. Interim medical history since our last visit reviewed. Allergies and medications reviewed and updated.  Review of Systems Per HPI unless specifically indicated above     Objective:    BP 137/83 mmHg  Pulse 62  Temp(Src) 97.6 F (36.4 C)  Wt 215 lb  (97.523 kg)  SpO2 97%  Wt Readings from Last 3 Encounters:  12/02/15 215 lb (97.523 kg)  08/12/15 211 lb (95.709 kg)  08/02/15 212 lb (96.163 kg)    Today's Vitals   12/02/15 0924 12/02/15 0947  BP: 133/92 137/83  Pulse: 67 62  Temp: 97.6 F (36.4 C)   Weight: 215 lb (97.523 kg)   SpO2: 97%    Physical Exam  Constitutional: He appears well-developed and well-nourished. No distress.  HENT:  Head: Normocephalic and atraumatic.  Eyes: EOM are normal. No scleral icterus.  Neck: No thyromegaly present.  Cardiovascular: Normal rate and regular rhythm.   Pulmonary/Chest: Effort normal and breath sounds normal.  Abdominal: Soft. Bowel sounds are normal. He exhibits no distension.  Musculoskeletal: He exhibits no edema.  Neurological: Coordination normal.  Skin: Skin is warm and dry. No pallor.  Psychiatric: He has a normal mood and affect. His behavior is normal. Judgment and thought content normal.   Last creatinine Sept 23, 2016 Creatinine 1.19 SGOT 48 SGPT 86     Assessment & Plan:   Problem List Items Addressed This Visit      Cardiovascular and Mediastinum   Essential hypertension, benign    Fair control today; DASH guidelines; limit alcohol intake; modest weight loss; CCB      Relevant Medications   omega-3  acid ethyl esters (LOVAZA) 1 g capsule     Genitourinary   Renal insufficiency    Check creatinine        Other   Dyslipidemia    Check lipids today off of the fenofibrate; he is on statin alone; work on healthier eating; try to plan ahead when on the road; less fast food; modest weight loss      Relevant Medications   omega-3 acid ethyl esters (LOVAZA) 1 g capsule   Other Relevant Orders   Lipid Panel w/o Chol/HDL Ratio (Completed)   Elevated serum glutamic pyruvic transaminase (SGPT) level - Primary    Check LFTs      Relevant Orders   Hepatic function panel (Completed)   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  An after-visit summary was printed and given to the patient at [place].  Please see the patient instructions which may contain other information and recommendations beyond what is mentioned above in the assessment and plan.  Face-to-face time with patient was more than 25 minutes, >50% time spent counseling and coordination of care  Orders Placed This Encounter  Procedures  . Hepatic function panel  . Lipid Panel w/o Chol/HDL Ratio

## 2015-12-02 NOTE — Assessment & Plan Note (Addendum)
Check lipids today off of the fenofibrate; he is on statin alone; work on healthier eating; try to plan ahead when on the road; less fast food; modest weight loss

## 2015-12-02 NOTE — Patient Instructions (Addendum)
Alcohol Use Disorder Alcohol use disorder is a mental disorder. It is not a one-time incident of heavy drinking. Alcohol use disorder is the excessive and uncontrollable use of alcohol over time that leads to problems with functioning in one or more areas of daily living. People with this disorder risk harming themselves and others when they drink to excess. Alcohol use disorder also can cause other mental disorders, such as mood and anxiety disorders, and serious physical problems. People with alcohol use disorder often misuse other drugs.  Alcohol use disorder is common and widespread. Some people with this disorder drink alcohol to cope with or escape from negative life events. Others drink to relieve chronic pain or symptoms of mental illness. People with a family history of alcohol use disorder are at higher risk of losing control and using alcohol to excess.  Drinking too much alcohol can cause injury, accidents, and health problems. One drink can be too much when you are:  Working.  Pregnant or breastfeeding.  Taking medicines. Ask your doctor.  Driving or planning to drive. SYMPTOMS  Signs and symptoms of alcohol use disorder may include the following:   Consumption ofalcohol inlarger amounts or over a longer period of time than intended.  Multiple unsuccessful attempts to cutdown or control alcohol use.   A great deal of time spent obtaining alcohol, using alcohol, or recovering from the effects of alcohol (hangover).  A strong desire or urge to use alcohol (cravings).   Continued use of alcohol despite problems at work, school, or home because of alcohol use.   Continued use of alcohol despite problems in relationships because of alcohol use.  Continued use of alcohol in situations when it is physically hazardous, such as driving a car.  Continued use of alcohol despite awareness of a physical or psychological problem that is likely related to alcohol use. Physical  problems related to alcohol use can involve the brain, heart, liver, stomach, and intestines. Psychological problems related to alcohol use include intoxication, depression, anxiety, psychosis, delirium, and dementia.   The need for increased amounts of alcohol to achieve the same desired effect, or a decreased effect from the consumption of the same amount of alcohol (tolerance).  Withdrawal symptoms upon reducing or stopping alcohol use, or alcohol use to reduce or avoid withdrawal symptoms. Withdrawal symptoms include:  Racing heart.  Hand tremor.  Difficulty sleeping.  Nausea.  Vomiting.  Hallucinations.  Restlessness.  Seizures. DIAGNOSIS Alcohol use disorder is diagnosed through an assessment by your health care provider. Your health care provider may start by asking three or four questions to screen for excessive or problematic alcohol use. To confirm a diagnosis of alcohol use disorder, at least two symptoms must be present within a 47-monthperiod. The severity of alcohol use disorder depends on the number of symptoms:  Mild--two or three.  Moderate--four or five.  Severe--six or more. Your health care provider may perform a physical exam or use results from lab tests to see if you have physical problems resulting from alcohol use. Your health care provider may refer you to a mental health professional for evaluation. TREATMENT  Some people with alcohol use disorder are able to reduce their alcohol use to low-risk levels. Some people with alcohol use disorder need to quit drinking alcohol. When necessary, mental health professionals with specialized training in substance use treatment can help. Your health care provider can help you decide how severe your alcohol use disorder is and what type of treatment you need.  The following forms of treatment are available:   Detoxification. Detoxification involves the use of prescription medicines to prevent alcohol withdrawal  symptoms in the first week after quitting. This is important for people with a history of symptoms of withdrawal and for heavy drinkers who are likely to have withdrawal symptoms. Alcohol withdrawal can be dangerous and, in severe cases, cause death. Detoxification is usually provided in a hospital or in-patient substance use treatment facility.  Counseling or talk therapy. Talk therapy is provided by substance use treatment counselors. It addresses the reasons people use alcohol and ways to keep them from drinking again. The goals of talk therapy are to help people with alcohol use disorder find healthy activities and ways to cope with life stress, to identify and avoid triggers for alcohol use, and to handle cravings, which can cause relapse.  Medicines.Different medicines can help treat alcohol use disorder through the following actions:  Decrease alcohol cravings.  Decrease the positive reward response felt from alcohol use.  Produce an uncomfortable physical reaction when alcohol is used (aversion therapy).  Support groups. Support groups are run by people who have quit drinking. They provide emotional support, advice, and guidance. These forms of treatment are often combined. Some people with alcohol use disorder benefit from intensive combination treatment provided by specialized substance use treatment centers. Both inpatient and outpatient treatment programs are available.   This information is not intended to replace advice given to you by your health care provider. Make sure you discuss any questions you have with your health care provider.   Document Released: 12/13/2004 Document Revised: 11/26/2014 Document Reviewed: 02/12/2013 Elsevier Interactive Patient Education 2016 [employer].  ---------------------------- Jesus Barber get labs today and call you about that We'll start medicine after getting the results Your goal blood pressure is less than 140 mmHg on top and under 90 on the  bottom Try to follow the DASH guidelines (DASH stands for Dietary Approaches to Stop Hypertension) Try to limit the sodium in your diet.  Ideally, consume less than 1.5 grams (less than 1,500mg ) per day. Do not add salt when cooking or at the table.  Check the sodium amount on labels when shopping, and choose items lower in sodium when given a choice. Avoid or limit foods that already contain a lot of sodium. Eat a diet rich in fruits and vegetables and whole grains. Check out Moderate Drinking [email or web address]

## 2015-12-03 ENCOUNTER — Telehealth: Payer: Self-pay | Admitting: Family Medicine

## 2015-12-03 LAB — HEPATIC FUNCTION PANEL
ALT: 41 IU/L (ref 0–44)
AST: 25 IU/L (ref 0–40)
Albumin: 4.8 g/dL (ref 3.5–5.5)
Alkaline Phosphatase: 95 IU/L (ref 39–117)
BILIRUBIN TOTAL: 0.6 mg/dL (ref 0.0–1.2)
BILIRUBIN, DIRECT: 0.15 mg/dL (ref 0.00–0.40)
Total Protein: 7 g/dL (ref 6.0–8.5)

## 2015-12-03 LAB — LIPID PANEL W/O CHOL/HDL RATIO
CHOLESTEROL TOTAL: 263 mg/dL — AB (ref 100–199)
HDL: 28 mg/dL — ABNORMAL LOW (ref >39)
Triglycerides: 1691 mg/dL (ref 0–149)

## 2015-12-03 MED ORDER — ESCITALOPRAM OXALATE 10 MG PO TABS: 10.0000 mg | ORAL_TABLET | Freq: Every day | ORAL | Status: DC

## 2015-12-03 MED ORDER — OMEGA-3-ACID ETHYL ESTERS 1 G PO CAPS: 2.0000 g | ORAL_CAPSULE | Freq: Two times a day (BID) | ORAL | Status: DC

## 2015-12-03 NOTE — Telephone Encounter (Signed)
I called pt about labs The first time he remembers checking TG, there were unmeasurable, >2900 I explained they are >1600 now; start Lovaza 2 gm BID Recheck lipids here in the office fasting in 6 weeks He'll move appt from 4 weeks from now to 6 weeks Start lexapro; don't drink with this; call me before next appt if needed He agrees I'll mail something to help him with TG diet; limit fried foods, sweets, starches

## 2015-12-04 DIAGNOSIS — F1010 Alcohol abuse, uncomplicated: Secondary | ICD-10-CM | POA: Insufficient documentation

## 2015-12-04 NOTE — Assessment & Plan Note (Signed)
Check creatinine 

## 2015-12-04 NOTE — Assessment & Plan Note (Signed)
So glad patient wanted to address this; discussed possibly using alcohol as a coping mechanism for anxiety; referred him to Moderate Drinking website; see AVS; will check LFTs today and then consider starting SSRI to treat what sounds like underlying anxiety; do not drink with medicine; close f/u

## 2015-12-04 NOTE — Assessment & Plan Note (Signed)
Fair control today; DASH guidelines; limit alcohol intake; modest weight loss; CCB

## 2015-12-16 ENCOUNTER — Other Ambulatory Visit: Payer: Self-pay

## 2015-12-16 MED ORDER — AMLODIPINE BESYLATE 5 MG PO TABS: 5.0000 mg | ORAL_TABLET | Freq: Every day | ORAL | Status: DC

## 2015-12-16 NOTE — Telephone Encounter (Signed)
Rx approved

## 2015-12-16 NOTE — Telephone Encounter (Signed)
Routing to provider  

## 2015-12-19 ENCOUNTER — Other Ambulatory Visit: Payer: Self-pay

## 2015-12-19 MED ORDER — ATORVASTATIN CALCIUM 40 MG PO TABS: 40.0000 mg | ORAL_TABLET | Freq: Every day | ORAL | Status: DC

## 2015-12-19 NOTE — Telephone Encounter (Signed)
Routing to provider  

## 2015-12-19 NOTE — Telephone Encounter (Signed)
Last LFTs reviewed; Rx approved

## 2015-12-30 ENCOUNTER — Ambulatory Visit: Payer: BLUE CROSS/BLUE SHIELD | Admitting: Family Medicine

## 2016-01-13 ENCOUNTER — Other Ambulatory Visit: Payer: BLUE CROSS/BLUE SHIELD

## 2016-01-13 ENCOUNTER — Encounter: Payer: Self-pay | Admitting: Family Medicine

## 2016-01-13 ENCOUNTER — Other Ambulatory Visit: Payer: Self-pay | Admitting: Family Medicine

## 2016-01-13 ENCOUNTER — Ambulatory Visit (INDEPENDENT_AMBULATORY_CARE_PROVIDER_SITE_OTHER): Payer: BLUE CROSS/BLUE SHIELD | Admitting: Family Medicine

## 2016-01-13 VITALS — BP 150/102 | HR 80 | Temp 98.4°F | Ht 71.5 in | Wt 209.6 lb

## 2016-01-13 DIAGNOSIS — R740 Nonspecific elevation of levels of transaminase and lactic acid dehydrogenase [LDH]: Principal | ICD-10-CM

## 2016-01-13 DIAGNOSIS — N289 Disorder of kidney and ureter, unspecified: Secondary | ICD-10-CM

## 2016-01-13 DIAGNOSIS — R7401 Elevation of levels of liver transaminase levels: Secondary | ICD-10-CM

## 2016-01-13 DIAGNOSIS — S86811A Strain of other muscle(s) and tendon(s) at lower leg level, right leg, initial encounter: Secondary | ICD-10-CM | POA: Diagnosis not present

## 2016-01-13 DIAGNOSIS — F1010 Alcohol abuse, uncomplicated: Secondary | ICD-10-CM

## 2016-01-13 DIAGNOSIS — E785 Hyperlipidemia, unspecified: Secondary | ICD-10-CM

## 2016-01-13 DIAGNOSIS — F439 Reaction to severe stress, unspecified: Secondary | ICD-10-CM

## 2016-01-13 DIAGNOSIS — E663 Overweight: Secondary | ICD-10-CM

## 2016-01-13 DIAGNOSIS — I10 Essential (primary) hypertension: Secondary | ICD-10-CM

## 2016-01-13 DIAGNOSIS — M25511 Pain in right shoulder: Secondary | ICD-10-CM | POA: Diagnosis not present

## 2016-01-13 DIAGNOSIS — Z658 Other specified problems related to psychosocial circumstances: Secondary | ICD-10-CM

## 2016-01-13 DIAGNOSIS — S86111A Strain of other muscle(s) and tendon(s) of posterior muscle group at lower leg level, right leg, initial encounter: Secondary | ICD-10-CM

## 2016-01-13 DIAGNOSIS — S86119A Strain of other muscle(s) and tendon(s) of posterior muscle group at lower leg level, unspecified leg, initial encounter: Secondary | ICD-10-CM | POA: Insufficient documentation

## 2016-01-13 LAB — AST (SGOT) PICCOLO, WAIVED: AST (SGOT) Piccolo, Waived: 46 U/L — ABNORMAL HIGH (ref 11–38)

## 2016-01-13 LAB — ALT (SGPT) PICCOLO, WAIVED: ALT (SGPT) PICCOLO, WAIVED: 51 U/L — AB (ref 10–47)

## 2016-01-13 MED ORDER — ASPIRIN EC 81 MG PO TBEC: 81.0000 mg | DELAYED_RELEASE_TABLET | Freq: Every day | ORAL | Status: DC

## 2016-01-13 MED ORDER — AMLODIPINE BESYLATE 10 MG PO TABS: 10.0000 mg | ORAL_TABLET | Freq: Every day | ORAL | Status: DC

## 2016-01-13 MED ORDER — ESCITALOPRAM OXALATE 10 MG PO TABS: 15.0000 mg | ORAL_TABLET | Freq: Every day | ORAL | Status: DC

## 2016-01-13 NOTE — Assessment & Plan Note (Signed)
Will increase the escitalopram from 10 mg to 15 mg daily; recheck in 3 weeks

## 2016-01-13 NOTE — Progress Notes (Signed)
BP 150/102 mmHg  Pulse 80  Temp(Src) 98.4 F (36.9 C)  Ht 5' 11.5" (1.816 m)  Wt 209 lb 9.6 oz (95.074 kg)  BMI 28.83 kg/m2  SpO2 97%   Subjective:    Patient ID: Jesus Barber, male    DOB: [birth year], 50 y.o.   MRN: [id number]  HPI: Jesus Barber is a 50 y.o. male  Chief Complaint  Patient presents with  . Follow-up    4 wk F/U. Patient states everything is going good.   . Elevated SGPT    He has been taking BP medicine; no side effects; no ankle edema  He has not been taking the fish oil prescription (Lovaza), $97, and quit that and now taking OTC fish oil 1200 mg two pills twice a day, that's has been for a few weeks; had been on statin plus fenofibrate for years; he recalls earlier have really high TG (>2600)  Cutting back on alcohol; it was a crutch, and he's not had any problem cutting back  Sleeping okay; work schedule fluctuates and OTC sleep aides, like generic benadryl  Taking the escitalopram; he can tell it's doing something; not as easy to snap mood wise; no anger or violence; still has some days when he gets stressed, it is better but wonders if dose could go up as we talked about it  He tore his right calf at work about 10 days ago, using compression and elevation; not red or hot  Relevant past medical, surgical, family and social history reviewed and updated as indicated. Interim medical history since our last visit reviewed. Allergies and medications reviewed and updated.  Review of Systems Per HPI unless specifically indicated above     Objective:    BP 150/102 mmHg  Pulse 80  Temp(Src) 98.4 F (36.9 C)  Ht 5' 11.5" (1.816 m)  Wt 209 lb 9.6 oz (95.074 kg)  BMI 28.83 kg/m2  SpO2 97%  Wt Readings from Last 3 Encounters:  01/13/16 209 lb 9.6 oz (95.074 kg)  12/02/15 215 lb (97.523 kg)  08/12/15 211 lb (95.709 kg)    Today's Vitals   01/13/16 1022 01/13/16 1102  BP: 153/93 150/102  Pulse: 80   Temp: 98.4 F (36.9 C)   Height:  5' 11.5" (1.816 m)   Weight: 209 lb 9.6 oz (95.074 kg)   SpO2: 97%    Physical Exam  Constitutional: He appears well-developed and well-nourished. No distress.  HENT:  Head: Normocephalic and atraumatic.  Eyes: EOM are normal. No scleral icterus.  Cardiovascular: Normal rate and regular rhythm.   Pulmonary/Chest: Effort normal and breath sounds normal.  Abdominal: Soft. Bowel sounds are normal. He exhibits no distension.  Musculoskeletal: He exhibits no edema.       Right lower leg: He exhibits no swelling and no edema.  Mild tenderness in the right calf; there is bruising and discoloration dependently, with violaceous purplish dependent blood below the ankle on the right; over the shin and lower calf, color is greenish; unable to elicit a positive Homan's sign on the right; calf is not hot or red; compression stocking removed for exam  Neurological: He is alert.  Skin: Skin is warm and dry. No pallor.  No xanthelasma  Psychiatric: He has a normal mood and affect. His behavior is normal. Judgment and thought content normal.    Results for orders placed or performed in visit on 01/13/16  ALT (SGPT) Piccolo, Waived  Result Value Ref Range   ALT (SGPT) Piccolo,  Waived 51 (H) 10 - 47 U/L  AST (SGOT) Piccolo, Waived  Result Value Ref Range   AST (SGOT) Piccolo, Waived 46 (H) 11 - 38 U/L      Assessment & Plan:   Problem List Items Addressed This Visit      Cardiovascular and Mediastinum   Essential hypertension, benign - Primary    Not to goal; patient will continue to work on DASH guidelines, try to watch content of sodium in his foods; praise given for weight loss and dietary efforts so far; reviewed his previous last few years of BP; discussed ideal and goal BPs; DASH hand-out given; increase amlopidine to 10 mg daily (max); recheck in 3 weeks; he did not tolerate beta-blocker well in 2008, and I explained that this is down to a fifth line agent anyway for hypertension       Relevant Medications   aspirin EC 81 MG tablet   amLODipine (NORVASC) 10 MG tablet     Musculoskeletal and Integument   Gastrocnemius muscle tear    With significant dependent blood to suggest tear of muscle, not tendon; nothing to suggest DVT; cautions given to go to ER if leg becomes red or hot; nothing to suggest compartment syndrome; suspect this will heal on its own        Genitourinary   Renal insufficiency    GFR 72 in Sept 2016; just follow; working to get BP controlled        Other   Dyslipidemia    Patient was on statin plus Lovaza, but could not afford the Lovaza, so stopped that a few weeks ago and started plain OTC fish oil; today's fasting lipid panel is lipemic; he has hx of TG over 2600; he was previously on statin plus fibrate; I have sent a note to endo colleague, and may just try him on the fenofibrate without the statin; today's fasting cholesterol panel is pending      Relevant Orders   Lipid Panel w/o Chol/HDL Ratio   Overweight (BMI 25.0-29.9)    Praise given for weight loss; keep up the good work, try to lose another 5-10 pounds; encouraged healthy diet, limit animal-based foods and try to get more plant-based diet      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 Sanda Klein visit.  Orders Placed This Encounter  Procedures  . Lipid Panel w/o Chol/HDL Ratio   Meds ordered this encounter  Medications  . aspirin EC 81 MG tablet    Sig: Take 1 tablet (81  mg total) by mouth daily.    Dispense:  30 tablet    Refill:  11  . escitalopram (LEXAPRO) 10 MG tablet    Sig: Take 1.5 tablets (15 mg total) by mouth daily.    Dispense:  45 tablet    Refill:  3  We're increasing the dose  . amLODipine (NORVASC) 10 MG tablet    Sig: Take 1 tablet (10 mg total) by mouth daily.    Dispense:  30 tablet    Refill:  3    We're increasing this dose

## 2016-01-13 NOTE — Assessment & Plan Note (Addendum)
With significant dependent blood to suggest tear of muscle, not tendon; nothing to suggest DVT; cautions given to go to ER if leg becomes red or hot; nothing to suggest compartment syndrome; suspect this will heal on its own

## 2016-01-13 NOTE — Assessment & Plan Note (Signed)
Check labs 

## 2016-01-13 NOTE — Assessment & Plan Note (Signed)
improved

## 2016-01-13 NOTE — Assessment & Plan Note (Addendum)
Patient was on statin plus Lovaza, but could not afford the Lovaza, so stopped that a few weeks ago and started plain OTC fish oil; today's fasting lipid panel is lipemic; he has hx of TG over 2600; he was previously on statin plus fibrate; I have sent a note to endo colleague, and may just try him on the fenofibrate without the statin; today's fasting cholesterol panel is pending

## 2016-01-13 NOTE — Assessment & Plan Note (Signed)
GFR 72 in Sept 2016; just follow; working to get BP controlled

## 2016-01-13 NOTE — Assessment & Plan Note (Signed)
Not to goal; patient will continue to work on [place] guidelines, try to watch content of sodium in his foods; praise given for weight loss and dietary efforts so far; reviewed his previous last few years of BP; discussed ideal and goal BPs; DASH hand-out given; increase amlopidine to 10 mg daily (max); recheck in 3 weeks; he did not tolerate beta-blocker well in 2008, and I explained that this is down to a fifth line agent anyway for hypertension

## 2016-01-13 NOTE — Assessment & Plan Note (Signed)
Reviewed labs done today with him; just very mild elevations of both hepatic transaminases; SGPT > SGOT so I suspect more likely fatty liver than alcohol effect; monitor

## 2016-01-13 NOTE — Assessment & Plan Note (Signed)
Much improved; so glad patient has managed to cut back

## 2016-01-13 NOTE — Assessment & Plan Note (Addendum)
Praise given for weight loss; keep up the good work, try to lose another 5-10 pounds; encouraged healthy diet, limit animal-based foods and try to get more plant-based diet

## 2016-01-13 NOTE — Patient Instructions (Addendum)
Your goal blood pressure is less than 140 mmHg on top and under 90 on the bottom Try to follow the DASH guidelines (DASH stands for Dietary Approaches to Stop Hypertension) Try to limit the sodium in your diet.  Ideally, consume less than 1.5 grams (less than 1,500mg ) per day. Do not add salt when cooking or at the table.  Check the sodium amount on labels when shopping, and choose items lower in sodium when given a choice. Avoid or limit foods that already contain a lot of sodium. Eat a diet rich in fruits and vegetables and whole grains.  Increase the amlodipine to 10 mg daily Increase the escitalopram to 15 mg daily  I'll see if the endocrinologist has any recommendations  DASH Eating Plan DASH stands for "Dietary Approaches to Stop Hypertension." The DASH eating plan is a healthy eating plan that has been shown to reduce high blood pressure (hypertension). Additional health benefits may include reducing the risk of type 2 diabetes mellitus, heart disease, and stroke. The DASH eating plan may also help with weight loss. WHAT DO I NEED TO KNOW ABOUT THE DASH EATING PLAN? For the DASH eating plan, you will follow these general guidelines:  Choose foods with a percent daily value for sodium of less than 5% (as listed on the food label).  Use salt-free seasonings or herbs instead of table salt or sea salt.  Check with your health care provider or pharmacist before using salt substitutes.  Eat lower-sodium products, often labeled as "lower sodium" or "no salt added."  Eat fresh foods.  Eat more vegetables, fruits, and low-fat dairy products.  Choose whole grains. Look for the word "whole" as the first word in the ingredient list.  Choose fish and skinless chicken or [place] more often than red meat. Limit fish, poultry, and meat to 6 oz (170 g) each day.  Limit sweets, desserts, sugars, and sugary drinks.  Choose heart-healthy fats.  Limit cheese to 1 oz (28 g) per day.  Eat more  home-cooked food and less restaurant, buffet, and fast food.  Limit fried foods.  Cook foods using methods other than frying.  Limit canned vegetables. If you do use them, rinse them well to decrease the sodium.  When eating at a restaurant, ask that your food be prepared with less salt, or no salt if possible. WHAT FOODS CAN I EAT? Seek help from a dietitian for individual calorie needs. Grains Whole grain or whole wheat bread. Brown rice. Whole grain or whole wheat pasta. Quinoa, bulgur, and whole grain cereals. Low-sodium cereals. Corn or whole wheat flour tortillas. Whole grain cornbread. Whole grain crackers. Low-sodium crackers. Vegetables Fresh or frozen vegetables (raw, steamed, roasted, or grilled). Low-sodium or reduced-sodium tomato and vegetable juices. Low-sodium or reduced-sodium tomato sauce and paste. Low-sodium or reduced-sodium canned vegetables.  Fruits All fresh, canned (in natural juice), or frozen fruits. Meat and Other Protein Products Ground beef (85% or leaner), grass-fed beef, or beef trimmed of fat. Skinless chicken or [place]. Ground chicken or [place]. Pork trimmed of fat. All fish and seafood. Eggs. Dried beans, peas, or lentils. Unsalted nuts and seeds. Unsalted canned beans. Dairy Low-fat dairy products, such as skim or 1% milk, 2% or reduced-fat cheeses, low-fat ricotta or cottage cheese, or plain low-fat yogurt. Low-sodium or reduced-sodium cheeses. Fats and Oils Tub margarines without trans fats. Light or reduced-fat mayonnaise and salad dressings (reduced sodium). Avocado. Safflower, olive, or canola oils. Natural peanut or almond butter. Other Unsalted popcorn and pretzels.  The items listed above may not be a complete list of recommended foods or beverages. Contact your dietitian for more options. WHAT FOODS ARE NOT RECOMMENDED? Grains White bread. White pasta. White rice. Refined cornbread. Bagels and croissants. Crackers that contain trans  fat. Vegetables Creamed or fried vegetables. Vegetables in a cheese sauce. Regular canned vegetables. Regular canned tomato sauce and paste. Regular tomato and vegetable juices. Fruits Dried fruits. Canned fruit in light or heavy syrup. Fruit juice. Meat and Other Protein Products Fatty cuts of meat. Ribs, chicken wings, bacon, sausage, bologna, salami, chitterlings, fatback, hot dogs, bratwurst, and packaged luncheon meats. Salted nuts and seeds. Canned beans with salt. Dairy Whole or 2% milk, cream, half-and-half, and cream cheese. Whole-fat or sweetened yogurt. Full-fat cheeses or blue cheese. Nondairy creamers and whipped toppings. Processed cheese, cheese spreads, or cheese curds. Condiments Onion and garlic salt, seasoned salt, table salt, and sea salt. Canned and packaged gravies. Worcestershire sauce. Tartar sauce. Barbecue sauce. Teriyaki sauce. Soy sauce, including reduced sodium. Steak sauce. Fish sauce. Oyster sauce. Cocktail sauce. Horseradish. Ketchup and mustard. Meat flavorings and tenderizers. Bouillon cubes. Hot sauce. Tabasco sauce. Marinades. Taco seasonings. Relishes. Fats and Oils Butter, stick margarine, lard, shortening, ghee, and bacon fat. Coconut, palm kernel, or palm oils. Regular salad dressings. Other Pickles and olives. Salted popcorn and pretzels. The items listed above may not be a complete list of foods and beverages to avoid. Contact your dietitian for more information. WHERE CAN I FIND MORE INFORMATION? National Heart, Lung, and Blood Institute: travelstabloid.com   This information is not intended to replace advice given to you by your health care provider. Make sure you discuss any questions you have with your health care provider.   Document Released: 10/25/2011 Document Revised: 11/26/2014 Document Reviewed: 09/09/2013 Elsevier Interactive Patient Education [employer].

## 2016-01-14 LAB — LIPID PANEL W/O CHOL/HDL RATIO
Cholesterol, Total: 148 mg/dL (ref 100–199)
HDL: 32 mg/dL — ABNORMAL LOW (ref >39)
Triglycerides: 457 mg/dL — ABNORMAL HIGH (ref 0–149)

## 2016-01-25 DIAGNOSIS — S53441D Ulnar collateral ligament sprain of right elbow, subsequent encounter: Secondary | ICD-10-CM | POA: Insufficient documentation

## 2016-02-10 ENCOUNTER — Ambulatory Visit: Payer: BLUE CROSS/BLUE SHIELD | Admitting: Family Medicine

## 2016-02-17 ENCOUNTER — Telehealth: Payer: Self-pay | Admitting: Family Medicine

## 2016-02-17 NOTE — Telephone Encounter (Signed)
Calling to touch base about msg I had left for specialist; sorry I missed him; will try Monday

## 2016-02-27 NOTE — Telephone Encounter (Signed)
I left msg; I know we talked earlier about lab results; I never received word back from endo; if he would like to talk with me about cholesterol, just give me a call; if there is a better number to call, just let me know (I have reached voicemail, but no live person)

## 2016-03-13 ENCOUNTER — Other Ambulatory Visit: Payer: Self-pay | Admitting: Family Medicine

## 2016-03-13 ENCOUNTER — Encounter: Payer: Self-pay | Admitting: *Deleted

## 2016-03-13 ENCOUNTER — Ambulatory Visit
Admission: EM | Admit: 2016-03-13 | Discharge: 2016-03-13 | Disposition: A | Discharge status: Home / Self Care | Payer: Self-pay | Attending: Family Medicine | Admitting: Family Medicine

## 2016-03-13 DIAGNOSIS — Z029 Encounter for administrative examinations, unspecified: Secondary | ICD-10-CM

## 2016-03-13 DIAGNOSIS — Z024 Encounter for examination for driving license: Secondary | ICD-10-CM

## 2016-03-13 LAB — DEPT OF TRANSP DIPSTICK, URINE (ARMC ONLY)
GLUCOSE, UA: NEGATIVE mg/dL
Hgb urine dipstick: NEGATIVE
Protein, ur: NEGATIVE mg/dL
Specific Gravity, Urine: 1.02 (ref 1.005–1.030)

## 2016-03-13 NOTE — ED Provider Notes (Signed)
CSN: [id number]     Arrival date & time 03/13/16  1000 History   None    Chief Complaint  Patient presents with  . 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 He has a history of hypertension hypercholesterolemia chronic kidney disease anxiety and previous lumbar surgery. He states he was on amlodipine for his blood pressure but he stopped this on his own a couple of months ago because of leg swelling. Follow-up with his primary care for reinstitution since his blood pressure is borderline at today's visit.      Past Medical History  Diagnosis Date  . Hypertension   . Chronic kidney disease   . Hyperlipidemia   . Elevated liver enzymes   . IFG (impaired fasting glucose)   . Hypogonadism in male   . Fatty liver   . Back pain     Herniated Disk  . Ankle fracture    Past Surgical History  Procedure Laterality Date  . Back surgery      Discectomy   Family History  Problem Relation Age of Onset  . Cancer Father     lung  . Alzheimer's disease Father   . Hyperlipidemia Paternal Grandmother    Social History  Substance Use Topics  . Smoking status: Former Smoker -- 25 years    Types: Cigarettes    Quit date: 11/20/2003  . Smokeless tobacco: Current User  . Alcohol Use: Yes     Comment: on occasion     Review of Systems  All other systems reviewed and are negative.   Allergies  Review of patient's allergies indicates no known allergies.  Home Medications   Prior to Admission medications   Medication Sig Start Date End Date Taking? Authorizing Provider  aspirin EC 81 MG tablet Take 1 tablet (81 mg total) by mouth daily. 01/13/16  Yes Arnetha Courser, MD  atorvastatin (LIPITOR) 40 MG tablet Take 1 tablet (40 mg total) by mouth at bedtime. 12/19/15  Yes Arnetha Courser, MD  escitalopram  (LEXAPRO) 10 MG tablet Take 1.5 tablets (15 mg total) by mouth daily. 01/13/16  Yes Arnetha Courser, MD  amLODipine (NORVASC) 10 MG tablet Take 1 tablet (10 mg total) by mouth daily. 01/13/16   Arnetha Courser, MD   Meds Ordered and Administered this Visit  Medications - No data to display  BP 138/90 mmHg  Pulse 75  Temp(Src) 97.7 F (36.5 C) (Oral)  Resp 18  Ht 6' (1.829 m)  Wt 215 lb (97.523 kg)  BMI 29.15 kg/m2  SpO2 98% No data found.   Physical Exam  Constitutional:  Referred to DOT physical sheet  Nursing note and vitals reviewed.   ED Course  Procedures (including critical care time)  Labs Review Labs Reviewed  DEPT OF TRANSP DIPSTICK, URINE([facility] ONLY)    Imaging Review No results found.   Visual Acuity Review  Right Eye Distance:   Left Eye Distance:   Bilateral Distance:    Right Eye Near:   Left Eye Near:    Bilateral Near:         MDM   1. Driver's permit physical examination    Patient qualifies for one year DOT license certificate. I've recommended that he referred to his primary care for better blood pressure control. He also requires corrective lenses while driving.    Lorin Picket, PA-C  03/13/16 1327 

## 2016-03-13 NOTE — ED Notes (Signed)
Commercial Drivers License physical

## 2016-05-14 ENCOUNTER — Other Ambulatory Visit: Payer: Self-pay | Admitting: Family Medicine

## 2016-05-22 ENCOUNTER — Other Ambulatory Visit: Payer: Self-pay | Admitting: Family Medicine

## 2016-06-12 ENCOUNTER — Other Ambulatory Visit: Payer: Self-pay | Admitting: Family Medicine

## 2016-06-22 ENCOUNTER — Encounter: Payer: Self-pay | Admitting: Family Medicine

## 2016-06-22 ENCOUNTER — Ambulatory Visit (INDEPENDENT_AMBULATORY_CARE_PROVIDER_SITE_OTHER): Payer: BLUE CROSS/BLUE SHIELD | Admitting: Family Medicine

## 2016-06-22 DIAGNOSIS — Z658 Other specified problems related to psychosocial circumstances: Secondary | ICD-10-CM

## 2016-06-22 DIAGNOSIS — R740 Nonspecific elevation of levels of transaminase and lactic acid dehydrogenase [LDH]: Secondary | ICD-10-CM | POA: Diagnosis not present

## 2016-06-22 DIAGNOSIS — I10 Essential (primary) hypertension: Secondary | ICD-10-CM

## 2016-06-22 DIAGNOSIS — R7401 Elevation of levels of liver transaminase levels: Secondary | ICD-10-CM

## 2016-06-22 DIAGNOSIS — F439 Reaction to severe stress, unspecified: Secondary | ICD-10-CM

## 2016-06-22 DIAGNOSIS — E291 Testicular hypofunction: Secondary | ICD-10-CM | POA: Diagnosis not present

## 2016-06-22 DIAGNOSIS — N289 Disorder of kidney and ureter, unspecified: Secondary | ICD-10-CM

## 2016-06-22 DIAGNOSIS — R7989 Other specified abnormal findings of blood chemistry: Secondary | ICD-10-CM

## 2016-06-22 DIAGNOSIS — E785 Hyperlipidemia, unspecified: Secondary | ICD-10-CM

## 2016-06-22 DIAGNOSIS — F1010 Alcohol abuse, uncomplicated: Secondary | ICD-10-CM | POA: Diagnosis not present

## 2016-06-22 MED ORDER — LISINOPRIL 10 MG PO TABS: 10.0000 mg | ORAL_TABLET | Freq: Every day | ORAL | 0 refills | Status: DC

## 2016-06-22 NOTE — Assessment & Plan Note (Signed)
Recheck; likely fatty liver, limit alcohol

## 2016-06-22 NOTE — Patient Instructions (Addendum)
Try to limit saturated fats in your diet (bologna, hot dogs, barbeque, cheeseburgers, hamburgers, steak, bacon, sausage, cheese, etc.) and get more fresh fruits, vegetables, and whole grains  Return on Aug 25th or just after for fasting labs and visit  Your goal blood pressure is less than 140 mmHg on top. Try to follow the DASH guidelines (DASH stands for Dietary Approaches to Stop Hypertension) Try to limit the sodium in your diet.  Ideally, consume less than 1.5 grams (less than 1,500mg ) per day. Do not add salt when cooking or at the table.  Check the sodium amount on labels when shopping, and choose items lower in sodium when given a choice. Avoid or limit foods that already contain a lot of sodium. Eat a diet rich in fruits and vegetables and whole grains.   Cholesterol Cholesterol is a white, waxy, fat-like substance needed by your body in small amounts. The liver makes all the cholesterol you need. Cholesterol is carried from the liver by the blood through the blood vessels. Deposits of cholesterol (plaque) may build up on blood vessel walls. These make the arteries narrower and stiffer. Cholesterol plaques increase the risk for heart attack and stroke.  You cannot feel your cholesterol level even if it is very high. The only way to know it is high is with a blood test. Once you know your cholesterol levels, you should keep a record of the test results. Work with your health care provider to keep your levels in the desired range.  WHAT DO THE RESULTS MEAN?  Total cholesterol is a rough measure of all the cholesterol in your blood.   LDL is the so-called bad cholesterol. This is the type that deposits cholesterol in the walls of the arteries. You want this level to be low.   HDL is the good cholesterol because it cleans the arteries and carries the LDL away. You want this level to be high.  Triglycerides are fat that the body can either burn for energy or store. High levels are closely  linked to heart disease.  WHAT ARE THE DESIRED LEVELS OF CHOLESTEROL?  Total cholesterol below 200.   LDL below 100 for people at risk, below 70 for those at very high risk.   HDL above 50 is good, above 60 is best.   Triglycerides below 150.  HOW CAN I LOWER MY CHOLESTEROL?  Diet. Follow your diet programs as directed by your health care provider.   Choose fish or white meat chicken and [place], roasted or baked. Limit fatty cuts of red meat, fried foods, and processed meats, such as sausage and lunch meats.   Eat lots of fresh fruits and vegetables.  Choose whole grains, beans, pasta, potatoes, and cereals.   Use only small amounts of olive, corn, or canola oils.   Avoid butter, mayonnaise, shortening, or palm kernel oils.  Avoid foods with trans fats.   Drink skim or nonfat milk and eat low-fat or nonfat yogurt and cheeses. Avoid whole milk, cream, ice cream, egg yolks, and full-fat cheeses.   Healthy desserts include angel food cake, ginger snaps, animal crackers, hard candy, popsicles, and low-fat or nonfat frozen yogurt. Avoid pastries, cakes, pies, and cookies.   Exercise. Follow your exercise programs as directed by your health care provider.   A regular program helps decrease LDL and raise HDL.   A regular program helps with weight control.   Do things that increase your activity level like gardening, walking, or taking the stairs. Ask  your health care provider about how you can be more active in your daily life.   Medicine. Take medicine only as directed by your health care provider.   Medicine may be prescribed by your health care provider to help lower cholesterol and decrease the risk for heart disease.   If you have several risk factors, you may need medicine even if your levels are normal.   This information is not intended to replace advice given to you by your health care provider. Make sure you discuss any questions you have with your  health care provider.   Document Released: 07/31/2001 Document Revised: 11/26/2014 Document Reviewed: 08/19/2013 Elsevier Interactive Patient Education 2016 [employer] DASH stands for "Dietary Approaches to Stop Hypertension." The DASH eating plan is a healthy eating plan that has been shown to reduce high blood pressure (hypertension). Additional health benefits may include reducing the risk of type 2 diabetes mellitus, heart disease, and stroke. The DASH eating plan may also help with weight loss. WHAT DO I NEED TO KNOW ABOUT THE DASH EATING PLAN? For the DASH eating plan, you will follow these general guidelines:  Choose foods with a percent daily value for sodium of less than 5% (as listed on the food label).  Use salt-free seasonings or herbs instead of table salt or sea salt.  Check with your health care provider or pharmacist before using salt substitutes.  Eat lower-sodium products, often labeled as "lower sodium" or "no salt added."  Eat fresh foods.  Eat more vegetables, fruits, and low-fat dairy products.  Choose whole grains. Look for the word "whole" as the first word in the ingredient list.  Choose fish and skinless chicken or [place] more often than red meat. Limit fish, poultry, and meat to 6 oz (170 g) each day.  Limit sweets, desserts, sugars, and sugary drinks.  Choose heart-healthy fats.  Limit cheese to 1 oz (28 g) per day.  Eat more home-cooked food and less restaurant, buffet, and fast food.  Limit fried foods.  Cook foods using methods other than frying.  Limit canned vegetables. If you do use them, rinse them well to decrease the sodium.  When eating at a restaurant, ask that your food be prepared with less salt, or no salt if possible. WHAT FOODS CAN I EAT? Seek help from a dietitian for individual calorie needs. Grains Whole grain or whole wheat bread. Brown rice. Whole grain or whole wheat pasta. Quinoa, bulgur, and whole  grain cereals. Low-sodium cereals. Corn or whole wheat flour tortillas. Whole grain cornbread. Whole grain crackers. Low-sodium crackers. Vegetables Fresh or frozen vegetables (raw, steamed, roasted, or grilled). Low-sodium or reduced-sodium tomato and vegetable juices. Low-sodium or reduced-sodium tomato sauce and paste. Low-sodium or reduced-sodium canned vegetables.  Fruits All fresh, canned (in natural juice), or frozen fruits. Meat and Other Protein Products Ground beef (85% or leaner), grass-fed beef, or beef trimmed of fat. Skinless chicken or [place]. Ground chicken or [place]. Pork trimmed of fat. All fish and seafood. Eggs. Dried beans, peas, or lentils. Unsalted nuts and seeds. Unsalted canned beans. Dairy Low-fat dairy products, such as skim or 1% milk, 2% or reduced-fat cheeses, low-fat ricotta or cottage cheese, or plain low-fat yogurt. Low-sodium or reduced-sodium cheeses. Fats and Oils Tub margarines without trans fats. Light or reduced-fat mayonnaise and salad dressings (reduced sodium). Avocado. Safflower, olive, or canola oils. Natural peanut or almond butter. Other Unsalted popcorn and pretzels. The items listed above may not be a complete  list of recommended foods or beverages. Contact your dietitian for more options. WHAT FOODS ARE NOT RECOMMENDED? Grains White bread. White pasta. White rice. Refined cornbread. Bagels and croissants. Crackers that contain trans fat. Vegetables Creamed or fried vegetables. Vegetables in a cheese sauce. Regular canned vegetables. Regular canned tomato sauce and paste. Regular tomato and vegetable juices. Fruits Dried fruits. Canned fruit in light or heavy syrup. Fruit juice. Meat and Other Protein Products Fatty cuts of meat. Ribs, chicken wings, bacon, sausage, bologna, salami, chitterlings, fatback, hot dogs, bratwurst, and packaged luncheon meats. Salted nuts and seeds. Canned beans with salt. Dairy Whole or 2% milk, cream, half-and-half,  and cream cheese. Whole-fat or sweetened yogurt. Full-fat cheeses or blue cheese. Nondairy creamers and whipped toppings. Processed cheese, cheese spreads, or cheese curds. Condiments Onion and garlic salt, seasoned salt, table salt, and sea salt. Canned and packaged gravies. Worcestershire sauce. Tartar sauce. Barbecue sauce. Teriyaki sauce. Soy sauce, including reduced sodium. Steak sauce. Fish sauce. Oyster sauce. Cocktail sauce. Horseradish. Ketchup and mustard. Meat flavorings and tenderizers. Bouillon cubes. Hot sauce. Tabasco sauce. Marinades. Taco seasonings. Relishes. Fats and Oils Butter, stick margarine, lard, shortening, ghee, and bacon fat. Coconut, palm kernel, or palm oils. Regular salad dressings. Other Pickles and olives. Salted popcorn and pretzels. The items listed above may not be a complete list of foods and beverages to avoid. Contact your dietitian for more information. WHERE CAN I FIND MORE INFORMATION? National Heart, Lung, and Blood Institute: travelstabloid.com   This information is not intended to replace advice given to you by your health care provider. Make sure you discuss any questions you have with your health care provider.   Document Released: 10/25/2011 Document Revised: 11/26/2014 Document Reviewed: 09/09/2013 Elsevier Interactive Patient Education [employer].

## 2016-06-22 NOTE — Assessment & Plan Note (Addendum)
Try DASH guidelines; will use ACE-I, avoid artificial salt; avoid fruit smoothies, occasional fruit is fine; recheck labs and BP in 3 weeks

## 2016-06-22 NOTE — Assessment & Plan Note (Signed)
Most likely genetic; will see what lipids are in a few weeks; limit saturated fats

## 2016-06-22 NOTE — Progress Notes (Signed)
BP (!) 136/92   Pulse 77   Temp 98.6 F (37 C) (Oral)   Resp 14   Wt 214 lb (97.1 kg)   SpO2 96%   BMI 29.02 kg/m    Subjective:    Patient ID: Jesus Barber, male    DOB: [birth year], 50 y.o.   MRN: [id number]  HPI: Jesus Barber is a 50 y.o. male  Chief Complaint  Patient presents with  . Hypertension    discuss meds, stopped amlodopine due to feet swelling   He has high blood pressure; he checked at work and it was 150 something; no chest pain; no headaches; he wakes up in the morning, it takes him a few extra minutes to clean out eye gunk No decongestants Not adding extra salt to the diet, but knows it is in the food Down a pound from April No hx of gout  Alcohol on the weekends; maybe 3-4 a day on the weekends, maybe 5 most; less than 14 drinks per week  High cholesterol; just going to have to have that set in front of him as to what he can eat; active at work; does not go to the gym  He is taking escitalopram; wondering if the dose can just be increased to 20 mg since the insurance company won't pay for 1-1/2 pills of the 10 mg strength and he's having to pay two co-pays to equal 15 mg  His torn calf muscle healed up  Depression screen PHQ 2/9 06/22/2016  Decreased Interest 0  Down, Depressed, Hopeless 0  PHQ - 2 Score 0   Relevant past medical, surgical, family and social history reviewed Past Medical History:  Diagnosis Date  . Ankle fracture   . Back pain    Herniated Disk  . Chronic kidney disease   . Elevated liver enzymes   . Fatty liver   . Hyperlipidemia   . Hypertension   . Hypogonadism in male   . IFG (impaired fasting glucose)    Past Surgical History:  Procedure Laterality Date  . BACK SURGERY     Discectomy   Family History  Problem Relation Age of Onset  . Cancer Father     lung  . Alzheimer's disease Father   . Hyperlipidemia Paternal Grandmother    Social History  Substance Use Topics  . Smoking status: Former  Smoker    Years: 25.00    Types: Cigarettes    Quit date: 11/20/2003  . Smokeless tobacco: Current User  . Alcohol use Yes     Comment: on occasion    Interim medical history since last visit reviewed. Allergies and medications reviewed  Review of Systems Per HPI unless specifically indicated above     Objective:    BP (!) 136/92   Pulse 77   Temp 98.6 F (37 C) (Oral)   Resp 14   Wt 214 lb (97.1 kg)   SpO2 96%   BMI 29.02 kg/m   Wt Readings from Last 3 Encounters:  06/22/16 214 lb (97.1 kg)  03/13/16 215 lb (97.5 kg)  01/13/16 209 lb 9.6 oz (95.1 kg)    Physical Exam  Constitutional: He appears well-developed and well-nourished. No distress.  HENT:  Head: Normocephalic and atraumatic.  Eyes: EOM are normal. No scleral icterus.  Neck: No thyromegaly present.  Cardiovascular: Normal rate and regular rhythm.   Pulmonary/Chest: Effort normal and breath sounds normal.  Abdominal: Soft. Bowel sounds are normal. He exhibits no distension.  Musculoskeletal:  He exhibits no edema.  Neurological: Coordination normal.  Skin: Skin is warm and dry. No pallor.  Psychiatric: He has a normal mood and affect. His behavior is normal. Judgment and thought content normal.    Results for orders placed or performed during the hospital encounter of 03/13/16  Dept of Transp dipstick, urine  Result Value Ref Range   Protein, ur NEGATIVE NEGATIVE mg/dL   Glucose, UA NEGATIVE NEGATIVE mg/dL   Specific Gravity, Urine 1.020 1.005 - 1.030   Hgb urine dipstick NEGATIVE NEGATIVE      Assessment & Plan:   Problem List Items Addressed This Visit      Cardiovascular and Mediastinum   Essential hypertension, benign    Try DASH guidelines; will use ACE-I, avoid artificial salt; avoid fruit smoothies, occasional fruit is fine; recheck labs and BP in 3 weeks      Relevant Medications   lisinopril (PRINIVIL,ZESTRIL) 10 MG tablet     Genitourinary   Renal insufficiency    Start ACE-I;  recheck labs in 3 weeks        Other   Stress    Will increase the SSRI from 15 mg to 20 mg for co-pay reasons, since insurance making him pay two co-pays to get 15 mg of escitalopram; call with any problems      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      Follow up plan: Return in about 3 weeks (around 07/13/2016).  An after-visit summary was printed and given to the patient at [facility].  Please see the patient instructions which may contain other information and recommendations beyond what is mentioned above in the assessment and plan.  Meds ordered this encounter  Medications  . lisinopril (PRINIVIL,ZESTRIL) 10 MG tablet    Sig: Take 1 tablet (10 mg total) by mouth daily. (for blood pressure)    Dispense:  90 tablet    Refill:  0    Orders Placed This Encounter  Procedures  . Comprehensive Metabolic Panel (CMET)  . Lipid panel

## 2016-06-24 NOTE — Assessment & Plan Note (Signed)
Start ACE-I; recheck labs in 3 weeks

## 2016-06-24 NOTE — Assessment & Plan Note (Signed)
Will increase the SSRI from 15 mg to 20 mg for co-pay reasons, since insurance making him pay two co-pays to get 15 mg of escitalopram; call with any problems

## 2016-06-24 NOTE — Assessment & Plan Note (Signed)
Encouraged pt to try to cut back; we'll recheck liver enzymes in 3 weeks

## 2016-06-24 NOTE — Assessment & Plan Note (Signed)
May be secondary to alcohol use

## 2016-07-20 ENCOUNTER — Other Ambulatory Visit: Payer: Self-pay | Admitting: Family Medicine

## 2016-07-20 ENCOUNTER — Ambulatory Visit: Payer: BLUE CROSS/BLUE SHIELD | Admitting: Family Medicine

## 2016-07-20 DIAGNOSIS — E785 Hyperlipidemia, unspecified: Secondary | ICD-10-CM

## 2016-07-20 DIAGNOSIS — R7401 Elevation of levels of liver transaminase levels: Secondary | ICD-10-CM

## 2016-07-20 DIAGNOSIS — R740 Nonspecific elevation of levels of transaminase and lactic acid dehydrogenase [LDH]: Principal | ICD-10-CM

## 2016-07-20 LAB — COMPREHENSIVE METABOLIC PANEL
ALK PHOS: 88 U/L (ref 40–115)
ALT: 54 U/L — AB (ref 9–46)
AST: 34 U/L (ref 10–40)
Albumin: 4.7 g/dL (ref 3.6–5.1)
BUN: 12 mg/dL (ref 7–25)
CO2: 23 mmol/L (ref 20–31)
CREATININE: 1.21 mg/dL (ref 0.60–1.35)
Calcium: 9.5 mg/dL (ref 8.6–10.3)
Chloride: 101 mmol/L (ref 98–110)
GLUCOSE: 108 mg/dL — AB (ref 65–99)
Potassium: 4.7 mmol/L (ref 3.5–5.3)
SODIUM: 137 mmol/L (ref 135–146)
TOTAL PROTEIN: 6.8 g/dL (ref 6.1–8.1)
Total Bilirubin: 0.6 mg/dL (ref 0.2–1.2)

## 2016-07-20 LAB — LIPID PANEL
CHOLESTEROL: 435 mg/dL — AB (ref 125–200)
HDL: 31 mg/dL — ABNORMAL LOW (ref >=40)
Total CHOL/HDL Ratio: 14 Ratio — ABNORMAL HIGH (ref <=5.0)
Triglycerides: 2701 mg/dL — ABNORMAL HIGH (ref <150)

## 2016-07-20 MED ORDER — ESCITALOPRAM OXALATE 20 MG PO TABS: 20.0000 mg | ORAL_TABLET | Freq: Every day | ORAL | 2 refills | Status: DC

## 2016-07-20 NOTE — Progress Notes (Signed)
Pt here for labs; he never started the 20 mg escitalopram b/c I did not send to pharm; he's still on 15 mg daily; he'll return next week

## 2016-07-21 ENCOUNTER — Telehealth: Payer: Self-pay | Admitting: Family Medicine

## 2016-07-21 DIAGNOSIS — E781 Pure hyperglyceridemia: Secondary | ICD-10-CM | POA: Insufficient documentation

## 2016-07-21 MED ORDER — ICOSAPENT ETHYL 1 G PO CAPS: 2.0000 | ORAL_CAPSULE | Freq: Two times a day (BID) | ORAL | 2 refills | Status: DC

## 2016-07-21 NOTE — Telephone Encounter (Signed)
I left detailed msg for patient; TG >2700; continue lipitor, add Vascepa; avoid sugary drinks, white bread, rice, fried foods, etc.; will refer to endocrinologist; risk of pancreatitis, go to ER if any abd pain, nausea, etc.

## 2016-07-21 NOTE — Assessment & Plan Note (Signed)
Continue lipitor; add Vascepa; refer to endo; left detailed msg, high TG put him at risk for pancreatitis; go to ER if abd pain, nausea, etc

## 2016-07-27 ENCOUNTER — Ambulatory Visit (INDEPENDENT_AMBULATORY_CARE_PROVIDER_SITE_OTHER): Payer: BLUE CROSS/BLUE SHIELD | Admitting: Family Medicine

## 2016-07-27 ENCOUNTER — Encounter: Payer: Self-pay | Admitting: Family Medicine

## 2016-07-27 DIAGNOSIS — I10 Essential (primary) hypertension: Secondary | ICD-10-CM | POA: Diagnosis not present

## 2016-07-27 DIAGNOSIS — E663 Overweight: Secondary | ICD-10-CM | POA: Diagnosis not present

## 2016-07-27 DIAGNOSIS — Z658 Other specified problems related to psychosocial circumstances: Secondary | ICD-10-CM

## 2016-07-27 DIAGNOSIS — N289 Disorder of kidney and ureter, unspecified: Secondary | ICD-10-CM

## 2016-07-27 DIAGNOSIS — E781 Pure hyperglyceridemia: Secondary | ICD-10-CM

## 2016-07-27 DIAGNOSIS — F439 Reaction to severe stress, unspecified: Secondary | ICD-10-CM

## 2016-07-27 DIAGNOSIS — F1010 Alcohol abuse, uncomplicated: Secondary | ICD-10-CM | POA: Diagnosis not present

## 2016-07-27 NOTE — Progress Notes (Signed)
BP 124/86   Pulse 97   Temp 98.2 F (36.8 C) (Oral)   Resp 14   Wt 213 lb (96.6 kg)   SpO2 94%   BMI 28.89 kg/m    Subjective:    Patient ID: Jesus Barber, male    DOB: Aug 22, 1966, 50 y.o.   MRN: [id number]  HPI: Jesus Barber is a 50 y.o. male  Chief Complaint  Patient presents with  . Follow-up   Patient is here for f/u of high TG and mood The first time he had his cholesterol checked TG >2900 (that's where the test reading stopped) He has always eaten whatever he wanted and realizes he needs to change He realizes he has to put it in his mind He eats white bread, white pasta Two teaspoons of sugar in coffee, just three days a week Wife quit making sweet tea at home, and he will drink sweet occasionally; can go to unsweetened, 2-3 glasses a month He drinks regular soft drinks, just a habit, when driving, could drink carbonated water or could try diet Does like rice and pasta Does drink whole milk He drinks [id number] alcoholic drinks per week Quit dipping tobacco two weeks ago He just started the higher dose of SSRI, cannot really tell a difference yet  Relevant past medical, surgical, family and social history reviewed Past Medical History:  Diagnosis Date  . Ankle fracture   . Back pain    Herniated Disk  . Chronic kidney disease   . Elevated liver enzymes   . Fatty liver   . Hyperlipidemia   . Hypertension   . Hypogonadism in male   . IFG (impaired fasting glucose)    Past Surgical History:  Procedure Laterality Date  . BACK SURGERY     Discectomy   Family History  Problem Relation Age of Onset  . Cancer Father     lung  . Alzheimer's disease Father   . Hyperlipidemia Paternal Grandmother    Social History  Substance Use Topics  . Smoking status: Former Smoker    Years: 25.00    Types: Cigarettes    Quit date: 11/20/2003  . Smokeless tobacco: Current User  . Alcohol use Yes     Comment: on occasion    Interim medical history since  last visit reviewed. Allergies and medications reviewed  Review of Systems Per HPI unless specifically indicated above     Objective:    BP 124/86   Pulse 97   Temp 98.2 F (36.8 C) (Oral)   Resp 14   Wt 213 lb (96.6 kg)   SpO2 94%   BMI 28.89 kg/m   Wt Readings from Last 3 Encounters:  07/27/16 213 lb (96.6 kg)  06/22/16 214 lb (97.1 kg)  03/13/16 215 lb (97.5 kg)    Physical Exam  Constitutional: He appears well-developed and well-nourished. No distress.  Cardiovascular: Normal rate and regular rhythm.   Pulmonary/Chest: Effort normal and breath sounds normal.  Abdominal: He exhibits no distension.  Skin:  No xanthalasma  Psychiatric: His mood appears not anxious. His affect is not blunt. He does not exhibit a depressed mood.   Results for orders placed or performed in visit on 07/20/16  Comprehensive Metabolic Panel (CMET)  Result Value Ref Range   Sodium 137 135 - 146 mmol/L   Potassium 4.7 3.5 - 5.3 mmol/L   Chloride 101 98 - 110 mmol/L   CO2 23 20 - 31 mmol/L   Glucose, Bld 108 (H)  65 - 99 mg/dL   BUN 12 7 - 25 mg/dL   Creat 1.21 0.60 - 1.35 mg/dL   Total Bilirubin 0.6 0.2 - 1.2 mg/dL   Alkaline Phosphatase 88 40 - 115 U/L   AST 34 10 - 40 U/L   ALT 54 (H) 9 - 46 U/L   Total Protein 6.8 6.1 - 8.1 g/dL   Albumin 4.7 3.6 - 5.1 g/dL   Calcium 9.5 8.6 - 10.3 mg/dL  Lipid panel  Result Value Ref Range   Cholesterol 435 (H) 125 - 200 mg/dL   Triglycerides 2,701 (H) <150 mg/dL   HDL 31 (L) >=40 mg/dL   Total CHOL/HDL Ratio 14.0 (H) <=5.0 Ratio   VLDL NOT CALC <30 mg/dL   LDL Cholesterol NOT CALC <130 mg/dL      Assessment & Plan:   Problem List Items Addressed This Visit      Cardiovascular and Mediastinum   Essential hypertension, benign    Fair control; patient will try to lose a little more weight        Genitourinary   Renal insufficiency    improved; stage 2 CKD        Other   Stress    Patient will continue the SSRI; call if any  issues      Overweight (BMI 25.0-29.9)    Encouraged patient to continue to work on modest weight loss      Mild alcohol use disorder ([place])    Explained that alcohol may be exacerbating his elevated TG; encouraged him to cut back; I am here to help if he needs it      Hypertriglyceridemia    Discussed last 3 lipid panel with him; explained risk of pancreatitis; we had a long discussion about foods, alcohol, factors that are in and out of his control when it comes to elevated TG; he will start the Vascepa, and will see endocrinologist about his lipids soon; I put in a note to endo to see if increasing his atorvastatin from 40 mg to 80 mg in the meantime would be warrranted; adding the Vascepa alone may be enough, but I have little experience with TG levels this high       Other Visit Diagnoses   None.     Follow up plan: No Follow-up on file.  An after-visit summary was printed and given to the patient at [facility].  Please see the patient instructions which may contain other information and recommendations beyond what is mentioned above in the assessment and plan.  Face-to-face time with patient was more than 25 minutes, >50% time spent counseling and coordination of care

## 2016-07-27 NOTE — Patient Instructions (Addendum)
Really buckle down on the diet and cutting back alcohol Brown rice or wild rice Soba noodles (buckwheat) [email or web address] Go to the ER if abdominal pain Let's have you try to lose another five pounds over the next 1-2 months Please schedule an appointment with Dr. Gabriel Carina at [facility] [phone], endocrinologist Avoid grapefruit  Food Choices to Lower Your Triglycerides Triglycerides are a type of fat in your blood. High levels of triglycerides can increase the risk of heart disease and stroke. If your triglyceride levels are high, the foods you eat and your eating habits are very important. Choosing the right foods can help lower your triglycerides.  WHAT GENERAL GUIDELINES DO I NEED TO FOLLOW?  Lose weight if you are overweight.   Limit or avoid alcohol.   Fill one half of your plate with vegetables and green salads.   Limit fruit to two servings a day. Choose fruit instead of juice.   Make one fourth of your plate whole grains. Look for the word "whole" as the first word in the ingredient list.  Fill one fourth of your plate with lean protein foods.  Enjoy fatty fish (such as salmon, mackerel, sardines, and tuna) three times a week.   Choose healthy fats.   Limit foods high in starch and sugar.  Eat more home-cooked food and less restaurant, buffet, and fast food.  Limit fried foods.  Cook foods using methods other than frying.  Limit saturated fats.  Check ingredient lists to avoid foods with partially hydrogenated oils (trans fats) in them. WHAT FOODS CAN I EAT?  Grains Whole grains, such as whole wheat or whole grain breads, crackers, cereals, and pasta. Unsweetened oatmeal, bulgur, barley, quinoa, or brown rice. Corn or whole wheat flour tortillas.  Vegetables Fresh or frozen vegetables (raw, steamed, roasted, or grilled). Green salads. Fruits All fresh, canned (in natural juice), or frozen  fruits. Meat and Other Protein Products Ground beef (85% or leaner), grass-fed beef, or beef trimmed of fat. Skinless chicken or [place]. Ground chicken or [place]. Pork trimmed of fat. All fish and seafood. Eggs. Dried beans, peas, or lentils. Unsalted nuts or seeds. Unsalted canned or dry beans. Dairy Low-fat dairy products, such as skim or 1% milk, 2% or reduced-fat cheeses, low-fat ricotta or cottage cheese, or plain low-fat yogurt. Fats and Oils Tub margarines without trans fats. Light or reduced-fat mayonnaise and salad dressings. Avocado. Safflower, olive, or canola oils. Natural peanut or almond butter. The items listed above may not be a complete list of recommended foods or beverages. Contact your dietitian for more options. WHAT FOODS ARE NOT RECOMMENDED?  Grains White bread. White pasta. White rice. Cornbread. Bagels, pastries, and croissants. Crackers that contain trans fat. Vegetables White potatoes. Corn. Creamed or fried vegetables. Vegetables in a cheese sauce. Fruits Dried fruits. Canned fruit in light or heavy syrup. Fruit juice. Meat and Other Protein Products Fatty cuts of meat. Ribs, chicken wings, bacon, sausage, bologna, salami, chitterlings, fatback, hot dogs, bratwurst, and packaged luncheon meats. Dairy Whole or 2% milk, cream, half-and-half, and cream cheese. Whole-fat or sweetened yogurt. Full-fat cheeses. Nondairy creamers and whipped toppings. Processed cheese, cheese spreads, or cheese curds. Sweets and Desserts Corn syrup, sugars, honey, and molasses. Candy. Jam and jelly. Syrup. Sweetened cereals. Cookies, pies, cakes, donuts, muffins, and ice cream. Fats and Oils Butter, stick margarine, lard, shortening, ghee, or bacon fat. Coconut, palm kernel, or palm oils. Beverages Alcohol. Sweetened drinks (such as sodas, lemonade, and fruit drinks or punches). The items listed  above may not be a complete list of foods and beverages to avoid. Contact your dietitian for  more information.   This information is not intended to replace advice given to you by your health care provider. Make sure you discuss any questions you have with your health care provider.   Document Released: 08/23/2004 Document Revised: 11/26/2014 Document Reviewed: 09/09/2013 Elsevier Interactive Patient Education [employer].

## 2016-07-29 NOTE — Assessment & Plan Note (Signed)
Fair control; patient will try to lose a little more weight

## 2016-07-29 NOTE — Assessment & Plan Note (Signed)
improved; stage 2 CKD

## 2016-07-29 NOTE — Assessment & Plan Note (Signed)
Discussed last 3 lipid panel with him; explained risk of pancreatitis; we had a long discussion about foods, alcohol, factors that are in and out of his control when it comes to elevated TG; he will start the Vascepa, and will see endocrinologist about his lipids soon; I put in a note to endo to see if increasing his atorvastatin from 40 mg to 80 mg in the meantime would be warrranted; adding the Vascepa alone may be enough, but I have little experience with TG levels this high

## 2016-07-29 NOTE — Assessment & Plan Note (Signed)
Patient will continue the SSRI; call if any issues

## 2016-07-29 NOTE — Assessment & Plan Note (Signed)
Encouraged patient to continue to work on modest weight loss

## 2016-07-29 NOTE — Assessment & Plan Note (Signed)
Explained that alcohol may be exacerbating his elevated TG; encouraged him to cut back; I am here to help if he needs it

## 2016-08-14 ENCOUNTER — Other Ambulatory Visit: Payer: Self-pay

## 2016-08-14 MED ORDER — ATORVASTATIN CALCIUM 40 MG PO TABS: 40.0000 mg | ORAL_TABLET | Freq: Every day | ORAL | 1 refills | Status: DC

## 2016-08-14 NOTE — Telephone Encounter (Signed)
Rx for statin sent Please check on referral to endocrinologist about lipids Thank you

## 2016-08-31 ENCOUNTER — Other Ambulatory Visit: Payer: Self-pay | Admitting: Family Medicine

## 2016-08-31 NOTE — Telephone Encounter (Signed)
Reviewed last Cr and K+; six month supply approved

## 2016-09-15 ENCOUNTER — Encounter: Payer: Self-pay | Admitting: Emergency Medicine

## 2016-09-15 ENCOUNTER — Emergency Department
Admission: EM | Admit: 2016-09-15 | Discharge: 2016-09-15 | Disposition: A | Discharge status: Home / Self Care | Payer: BLUE CROSS/BLUE SHIELD | Attending: Emergency Medicine | Admitting: Emergency Medicine

## 2016-09-15 ENCOUNTER — Emergency Department: Payer: BLUE CROSS/BLUE SHIELD

## 2016-09-15 DIAGNOSIS — W11XXXA Fall on and from ladder, initial encounter: Secondary | ICD-10-CM | POA: Insufficient documentation

## 2016-09-15 DIAGNOSIS — Y929 Unspecified place or not applicable: Secondary | ICD-10-CM | POA: Diagnosis not present

## 2016-09-15 DIAGNOSIS — I129 Hypertensive chronic kidney disease with stage 1 through stage 4 chronic kidney disease, or unspecified chronic kidney disease: Secondary | ICD-10-CM | POA: Insufficient documentation

## 2016-09-15 DIAGNOSIS — Y999 Unspecified external cause status: Secondary | ICD-10-CM | POA: Diagnosis not present

## 2016-09-15 DIAGNOSIS — Z79899 Other long term (current) drug therapy: Secondary | ICD-10-CM | POA: Diagnosis not present

## 2016-09-15 DIAGNOSIS — S53104A Unspecified dislocation of right ulnohumeral joint, initial encounter: Secondary | ICD-10-CM | POA: Diagnosis not present

## 2016-09-15 DIAGNOSIS — F17200 Nicotine dependence, unspecified, uncomplicated: Secondary | ICD-10-CM | POA: Insufficient documentation

## 2016-09-15 DIAGNOSIS — Y939 Activity, unspecified: Secondary | ICD-10-CM | POA: Insufficient documentation

## 2016-09-15 DIAGNOSIS — N189 Chronic kidney disease, unspecified: Secondary | ICD-10-CM | POA: Insufficient documentation

## 2016-09-15 DIAGNOSIS — S59901A Unspecified injury of right elbow, initial encounter: Secondary | ICD-10-CM | POA: Diagnosis present

## 2016-09-15 DIAGNOSIS — Z7982 Long term (current) use of aspirin: Secondary | ICD-10-CM | POA: Insufficient documentation

## 2016-09-15 MED ORDER — NAPROXEN 500 MG PO TABS: 500.0000 mg | ORAL_TABLET | Freq: Two times a day (BID) | ORAL | 2 refills | Status: DC

## 2016-09-15 MED ORDER — MORPHINE SULFATE (PF) 2 MG/ML IV SOLN
4.0000 mg | Freq: Once | INTRAVENOUS | Status: AC
  Administered 2016-09-15: 4 mg via INTRAMUSCULAR

## 2016-09-15 MED ORDER — MORPHINE SULFATE (PF) 2 MG/ML IV SOLN
INTRAVENOUS | Status: AC
  Administered 2016-09-15: 4 mg via INTRAMUSCULAR
  Filled 2016-09-15: qty 2

## 2016-09-15 MED ORDER — OXYCODONE-ACETAMINOPHEN 5-325 MG PO TABS: 1.0000 | ORAL_TABLET | Freq: Four times a day (QID) | ORAL | 0 refills | Status: DC | PRN

## 2016-09-15 MED ORDER — ONDANSETRON 4 MG PO TBDP
ORAL_TABLET | ORAL | Status: DC
Start: 2016-09-15 — End: 2016-09-16
  Filled 2016-09-15: qty 1

## 2016-09-15 MED ORDER — FENTANYL CITRATE (PF) 100 MCG/2ML IJ SOLN
INTRAMUSCULAR | Status: AC
  Filled 2016-09-15: qty 2

## 2016-09-15 MED ORDER — FENTANYL CITRATE (PF) 100 MCG/2ML IJ SOLN
50.0000 ug | INTRAMUSCULAR | Status: DC | PRN
  Administered 2016-09-15: 50 ug via NASAL

## 2016-09-15 MED ORDER — ONDANSETRON 4 MG PO TBDP
4.0000 mg | ORAL_TABLET | Freq: Once | ORAL | Status: AC
  Administered 2016-09-15: 4 mg via ORAL

## 2016-09-15 NOTE — ED Triage Notes (Signed)
Patient fell off a ladder and fell on his right elbow. Patient with pain, swelling and deformity to right elbow.

## 2016-09-15 NOTE — ED Provider Notes (Signed)
[facility] Emergency Department Provider Note   ____________________________________________    I have reviewed the triage vital signs and the nursing notes.   HISTORY  Chief Complaint Elbow Pain     HPI Jesus Barber is a 50 y.o. male who presents with elbow pain after a fall from a ladder. Patient reports severe pain in his right elbow and a deformity. He denies other injuries. No head injury. No neck pain. No abdominal pain. No shortness of breath. No chest pain. He lost his balance on the ladder.   Past Medical History:  Diagnosis Date  . Ankle fracture   . Back pain    Herniated Disk  . Chronic kidney disease   . Elevated liver enzymes   . Fatty liver   . Hyperlipidemia   . Hypertension   . Hypogonadism in male   . IFG (impaired fasting glucose)     Patient Active Problem List   Diagnosis Date Noted  . Hypertriglyceridemia 07/21/2016  . Stress 01/13/2016  . Mild alcohol use disorder 12/04/2015  . Low serum testosterone level 08/14/2015  . Dyslipidemia 08/12/2015  . Essential hypertension, benign 08/12/2015  . Overweight (BMI 25.0-29.9) 08/12/2015  . Renal insufficiency 08/12/2015  . Elevated serum glutamic pyruvic transaminase (SGPT) level 08/12/2015  . Shoulder pain, right 08/12/2015    Past Surgical History:  Procedure Laterality Date  . BACK SURGERY     Discectomy    Prior to Admission medications   Medication Sig Start Date End Date Taking? Authorizing Provider  aspirin EC 81 MG tablet Take 1 tablet (81 mg total) by mouth daily. 01/13/16   Arnetha Courser, MD  atorvastatin (LIPITOR) 40 MG tablet Take 1 tablet (40 mg total) by mouth at bedtime. 08/14/16   Arnetha Courser, MD  escitalopram (LEXAPRO) 20 MG tablet Take 1 tablet (20 mg total) by mouth daily. 07/20/16   Arnetha Courser, MD  Icosapent Ethyl (VASCEPA) 1 g CAPS Take 2 capsules by mouth 2 (two) times daily. (keep taking atorvastatin) 07/21/16   Arnetha Courser, MD    lisinopril (PRINIVIL,ZESTRIL) 10 MG tablet take 1 tablet by mouth once daily 08/31/16   Arnetha Courser, MD  naproxen (NAPROSYN) 500 MG tablet Take 1 tablet (500 mg total) by mouth 2 (two) times daily with a meal. 09/15/16   Lavonia Drafts, MD  oxyCODONE-acetaminophen (ROXICET) 5-325 MG tablet Take 1 tablet by mouth every 6 (six) hours as needed. 09/15/16 09/15/17  Lavonia Drafts, MD     Allergies Review of patient's allergies indicates no known allergies.  Family History  Problem Relation Age of Onset  . Cancer Father     lung  . Alzheimer's disease Father   . Hyperlipidemia Paternal Grandmother     Social History Social History  Substance Use Topics  . Smoking status: Former Smoker    Years: 25.00    Types: Cigarettes    Quit date: 11/20/2003  . Smokeless tobacco: Current User  . Alcohol use Yes     Comment: on occasion     Review of Systems  Constitutional: No Dizziness Eyes: No visual changes.  ENT: No neck pain Cardiovascular: Denies chest pain. Respiratory: Denies shortness of breath. Gastrointestinal: No abdominal pain.  No nausea, no vomiting.    Musculoskeletal: Elbow pain as above Skin: Negative for laceration Neurological: Negative for headaches or weakness  10-point ROS otherwise negative.  ____________________________________________   PHYSICAL EXAM:  VITAL SIGNS: ED Triage Vitals  Enc Vitals  Group     BP 09/15/16 2124 (!) 123/95     Pulse Rate 09/15/16 2124 75     Resp 09/15/16 2124 20     Temp 09/15/16 2124 97.9 F (36.6 C)     Temp Source 09/15/16 2124 Oral     SpO2 09/15/16 2124 98 %     Weight 09/15/16 2124 213 lb (96.6 kg)     Height 09/15/16 2124 6' (1.829 m)     Head Circumference --      Peak Flow --      Pain Score 09/15/16 2129 10     Pain Loc --      Pain Edu? --      Excl. in [place]? --     Constitutional: Alert and oriented.  Head: Atraumatic. Nose: No congestion/rhinnorhea. Mouth/Throat: Mucous membranes are moist.   Neck:   Painless ROM, No vertebral tenderness to palpation  Cardiovascular: Normal rate, regular rhythm.   Good peripheral circulation. Respiratory: Normal respiratory effort.  No retractions. Lungs CTAB. Gastrointestinal: Soft and nontender. No distention.  No CVA tenderness. Genitourinary: deferred Musculoskeletal: Right elbow with deformity and swelling. 2+ distal pulses. Sensation grossly intact. Neurologic:  Normal speech and language. No gross focal neurologic deficits are appreciated.  Skin:  Skin is warm, dry and intact. No rash noted. Psychiatric: Mood and affect are normal. Speech and behavior are normal.  ____________________________________________   LABS (all labs ordered are listed, but only abnormal results are displayed)  Labs Reviewed - No data to display ____________________________________________  EKG  None ____________________________________________  RADIOLOGY  X-ray consistent with right elbow dislocation Postreduction x-ray assistant was successful reduction ____________________________________________   PROCEDURES  Procedure(s) performed: yes  Reduction of dislocation Date/Time: 11:11 PM Performed by: Lavonia Drafts Authorized by: Lavonia Drafts Consent: Verbal consent obtained. Risks and benefits: risks, benefits and alternatives were discussed Consent given by: patient Required items: required blood products, implants, devices, and special equipment available Time out: Immediately prior to procedure a "time out" was called to verify the correct patient, procedure, equipment, support staff and site/side marked as required.  Patient sedated: Patient refused sedation   Patient tolerance: Patient tolerated the procedure well with no immediate complications. Joint: Right elbow Reduction technique: Traction countertraction       Critical Care performed: No ____________________________________________   INITIAL IMPRESSION / ASSESSMENT AND PLAN /  ED COURSE  Pertinent labs & imaging results that were available during my care of the patient were reviewed by me and considered in my medical decision making (see chart for details).  Patient with right elbow dislocation. He opted to forego sedation. He gave verbal permission for reduction.  Clinical Course  After reduction patient is able to range elbow but with continued pain which is expected. He does feel "a catch" at approximately 120 of extension. Suspect ligamentous injury. Discussed the need for outpatient MRI/orthopedic evaluation ____________________________________________   FINAL CLINICAL IMPRESSION(S) / ED DIAGNOSES  Final diagnoses:  Dislocation of right elbow, initial encounter      NEW MEDICATIONS STARTED DURING THIS VISIT:  New Prescriptions   NAPROXEN (NAPROSYN) 500 MG TABLET    Take 1 tablet (500 mg total) by mouth 2 (two) times daily with a meal.   OXYCODONE-ACETAMINOPHEN (ROXICET) 5-325 MG TABLET    Take 1 tablet by mouth every 6 (six) hours as needed.     Note:  This document was prepared using Dragon voice recognition software and may include unintentional dictation errors.    Lavonia Drafts, MD  09/15/16 2311  

## 2016-09-15 NOTE — ED Notes (Addendum)
Pt came in with a dislocated right elbow. Pt stating that he fell off a ladder. Dr. Corky Downs has already reset elbow. Pt is holding his right arm across his body. Pt is denying any numbness or tingling +2 pulses in RUE.

## 2016-09-22 ENCOUNTER — Other Ambulatory Visit: Payer: Self-pay | Admitting: Family Medicine

## 2016-09-23 NOTE — Telephone Encounter (Signed)
rx approved; he should be seeing endo soon for lipids

## 2016-10-03 ENCOUNTER — Other Ambulatory Visit: Payer: Self-pay | Admitting: Orthopedic Surgery

## 2016-10-03 DIAGNOSIS — M25521 Pain in right elbow: Secondary | ICD-10-CM

## 2016-10-15 ENCOUNTER — Ambulatory Visit
Admission: RE | Admit: 2016-10-15 | Discharge: 2016-10-15 | Disposition: A | Discharge status: Home / Self Care | Payer: BLUE CROSS/BLUE SHIELD | Source: Ambulatory Visit | Attending: Orthopedic Surgery | Admitting: Orthopedic Surgery

## 2016-10-15 DIAGNOSIS — M25521 Pain in right elbow: Secondary | ICD-10-CM | POA: Insufficient documentation

## 2016-10-15 DIAGNOSIS — X58XXXA Exposure to other specified factors, initial encounter: Secondary | ICD-10-CM | POA: Insufficient documentation

## 2016-10-15 DIAGNOSIS — S46811A Strain of other muscles, fascia and tendons at shoulder and upper arm level, right arm, initial encounter: Secondary | ICD-10-CM | POA: Insufficient documentation

## 2016-10-15 DIAGNOSIS — T148XXA Other injury of unspecified body region, initial encounter: Secondary | ICD-10-CM | POA: Diagnosis not present

## 2016-10-15 DIAGNOSIS — M25421 Effusion, right elbow: Secondary | ICD-10-CM | POA: Insufficient documentation

## 2016-11-23 ENCOUNTER — Other Ambulatory Visit: Payer: Self-pay | Admitting: Family Medicine

## 2016-11-23 DIAGNOSIS — E785 Hyperlipidemia, unspecified: Secondary | ICD-10-CM

## 2016-11-23 DIAGNOSIS — E781 Pure hyperglyceridemia: Secondary | ICD-10-CM

## 2016-11-23 NOTE — Telephone Encounter (Signed)
Pt.notified

## 2016-11-23 NOTE — Telephone Encounter (Signed)
Dr. Gabriel Carina is now managing his lipids; he'll want to have pharmacy contact her office for refills of atorvastatin; thank you

## 2016-12-24 ENCOUNTER — Other Ambulatory Visit: Payer: Self-pay | Admitting: Family Medicine

## 2016-12-24 NOTE — Telephone Encounter (Signed)
Last Cr and K+ reviewed; Rxs approved 

## 2017-02-15 ENCOUNTER — Telehealth: Payer: Self-pay

## 2017-02-15 ENCOUNTER — Ambulatory Visit
Admission: EM | Admit: 2017-02-15 | Discharge: 2017-02-15 | Disposition: A | Discharge status: Home / Self Care | Payer: BLUE CROSS/BLUE SHIELD | Attending: Family Medicine | Admitting: Family Medicine

## 2017-02-15 DIAGNOSIS — Z0289 Encounter for other administrative examinations: Secondary | ICD-10-CM

## 2017-02-15 LAB — DEPT OF TRANSP DIPSTICK, URINE (ARMC ONLY)
GLUCOSE, UA: NEGATIVE mg/dL
Hgb urine dipstick: NEGATIVE
Protein, ur: NEGATIVE mg/dL
SPECIFIC GRAVITY, URINE: 1.02 (ref 1.005–1.030)

## 2017-02-15 NOTE — ED Triage Notes (Signed)
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 No other complaints.

## 2017-02-15 NOTE — Telephone Encounter (Signed)
Patient called is over at urgent care needing to get DOT cpe and they are stating he needs note from PCP that you treating him for BP and it is under control.  He states has never had to do this before it must be new? Can you do this?

## 2017-02-15 NOTE — Telephone Encounter (Signed)
Letter already done and faxed

## 2017-02-15 NOTE — ED Provider Notes (Signed)
[facility]    CSN: [id number] Arrival date & time: 02/15/17  1132     History   Chief Complaint Chief Complaint  Patient presents with  . Commercial Driver's License Exam    HPI Jesus Barber is a 51 y.o. male.   Patient here for DOT Physical (see scanned form)   The history is provided by the patient.    Past Medical History:  Diagnosis Date  . Ankle fracture   . Back pain    Herniated Disk  . Chronic kidney disease   . Elevated liver enzymes   . Fatty liver   . Hyperlipidemia   . Hypertension   . Hypogonadism in male   . IFG (impaired fasting glucose)     Patient Active Problem List   Diagnosis Date Noted  . Hypertriglyceridemia 07/21/2016  . Stress 01/13/2016  . Mild alcohol use disorder 12/04/2015  . Low serum testosterone level 08/14/2015  . Dyslipidemia 08/12/2015  . Essential hypertension, benign 08/12/2015  . Overweight (BMI 25.0-29.9) 08/12/2015  . Renal insufficiency 08/12/2015  . Elevated serum glutamic pyruvic transaminase (SGPT) level 08/12/2015  . Shoulder pain, right 08/12/2015    Past Surgical History:  Procedure Laterality Date  . BACK SURGERY     Discectomy       Home Medications    Prior to Admission medications   Medication Sig Start Date End Date Taking? Authorizing Provider  atorvastatin (LIPITOR) 40 MG tablet take 1 tablet by mouth at bedtime 09/23/16  Yes Arnetha Courser, MD  escitalopram (LEXAPRO) 20 MG tablet take 1 tablet by mouth once daily 12/24/16  Yes Arnetha Courser, MD  Icosapent Ethyl (VASCEPA) 1 g CAPS Take 2 capsules by mouth 2 (two) times daily. (keep taking atorvastatin) 07/21/16  Yes Arnetha Courser, MD  lisinopril (PRINIVIL,ZESTRIL) 10 MG tablet take 1 tablet by mouth once daily 12/24/16  Yes Arnetha Courser, MD  aspirin EC 81 MG tablet Take 1 tablet (81 mg total) by mouth daily. 01/13/16   Arnetha Courser, MD  naproxen (NAPROSYN) 500 MG tablet Take 1 tablet (500 mg total) by mouth 2 (two) times  daily with a meal. 09/15/16   Lavonia Drafts, MD  oxyCODONE-acetaminophen (ROXICET) 5-325 MG tablet Take 1 tablet by mouth every 6 (six) hours as needed. 09/15/16 09/15/17  Lavonia Drafts, MD    Family History Family History  Problem Relation Age of Onset  . Cancer Father     lung  . Alzheimer's disease Father   . Hyperlipidemia Paternal Grandmother     Social History Social History  Substance Use Topics  . Smoking status: Former Smoker    Years: 25.00    Types: Cigarettes    Quit date: 11/20/2003  . Smokeless tobacco: Current User  . Alcohol use Yes     Comment: on occasion      Allergies   Patient has no known allergies.   Review of Systems Review of Systems   Physical Exam Triage Vital Signs ED Triage Vitals  Enc Vitals Group     BP 02/15/17 1219 131/78     Pulse Rate 02/15/17 1219 83     Resp 02/15/17 1219 16     Temp 02/15/17 1219 98.2 F (36.8 C)     Temp Source 02/15/17 1219 Oral     SpO2 02/15/17 1219 98 %     Weight 02/15/17 1217 219 lb 9.6 oz (99.6 kg)     Height 02/15/17 1217 6\' 1"  (1.854  m)     Head Circumference --      Peak Flow --      Pain Score --      Pain Loc --      Pain Edu? --      Excl. in [place]? --    No data found.   Updated Vital Signs BP 131/78 (BP Location: Left Arm)   Pulse 83   Temp 98.2 F (36.8 C) (Oral)   Resp 16   Ht 6\' 1"  (1.854 m)   Wt 219 lb 9.6 oz (99.6 kg)   SpO2 98%   BMI 28.97 kg/m   Visual Acuity Right Eye Distance: 20/20 (corrected) Left Eye Distance: 20/20 (corrected) Bilateral Distance: 20/20 (corrected)  Right Eye Near:   Left Eye Near:    Bilateral Near:     Physical Exam   UC Treatments / Results  Labs (all labs ordered are listed, but only abnormal results are displayed) Labs Reviewed  DEPT OF TRANSP DIPSTICK, URINE([facility] ONLY)    EKG  EKG Interpretation None       Radiology No results found.  Procedures Procedures (including critical care time)  Medications  Ordered in UC Medications - No data to display   Initial Impression / Assessment and Plan / UC Course  I have reviewed the triage vital signs and the nursing notes.  Pertinent labs & imaging results that were available during my care of the patient were reviewed by me and considered in my medical decision making (see chart for details).      Final Clinical Impressions(s) / UC Diagnoses   Final diagnoses:  Encounter for examination required by Department of Transportation (DOT)    New Prescriptions New Prescriptions   No medications on file   DOT Physical (medically qualified for 1 year; see scanned form)   Norval Gable, MD 02/15/17 1256

## 2017-02-22 ENCOUNTER — Encounter: Payer: Self-pay | Admitting: Family Medicine

## 2017-02-22 ENCOUNTER — Ambulatory Visit (INDEPENDENT_AMBULATORY_CARE_PROVIDER_SITE_OTHER): Payer: BLUE CROSS/BLUE SHIELD | Admitting: Family Medicine

## 2017-02-22 VITALS — BP 124/78 | HR 109 | Temp 97.9°F | Resp 14 | Wt 219.0 lb

## 2017-02-22 DIAGNOSIS — Z5181 Encounter for therapeutic drug level monitoring: Secondary | ICD-10-CM | POA: Insufficient documentation

## 2017-02-22 DIAGNOSIS — E781 Pure hyperglyceridemia: Secondary | ICD-10-CM | POA: Diagnosis not present

## 2017-02-22 DIAGNOSIS — Z23 Encounter for immunization: Secondary | ICD-10-CM

## 2017-02-22 DIAGNOSIS — I10 Essential (primary) hypertension: Secondary | ICD-10-CM

## 2017-02-22 DIAGNOSIS — E785 Hyperlipidemia, unspecified: Secondary | ICD-10-CM

## 2017-02-22 NOTE — Progress Notes (Signed)
BP 124/78   Pulse (!) 109   Temp 97.9 F (36.6 C) (Oral)   Resp 14   Wt 219 lb (99.3 kg)   SpO2 95%   BMI 28.89 kg/m    Subjective:    Patient ID: Jesus Barber, male    DOB: [birth year], 51 y.o.   MRN: [id number]  HPI: Jesus Barber is a 51 y.o. male  Chief Complaint  Patient presents with  . Follow-up    HTN   Patient is here for f/u of hypertension Hypertension Patient has had hypertension since years  Checking blood pressure away from here?  no  Feels blood pressure is under good control  Hypertension-associated complications:  none  If taking medicines, are you taking them regularly?  yes  Siblings / family history: Does high blood pressure run in your family?   YES strong on father's side  Salt:  Trying to limit sodium / salt when buying foods at the grocery store?  NO Do you try to limit added salt when cooking and at the table?  yes  Saturated fats: Do you eat a lot of foods like bacon, sausage, pepperoni, cheeseburgers, hot dogs, bologna, and cheese?  YES , some fast foods  Sedentary lifestyle:  Exercise/activity level:  frequent (three or more times per week)  Steroids/Non-steroidals:  Have you had a recent cortisone shot in the last few months?  no cortisone shot a year ago Do you take prednisone or prescription NSAIDs or take OTCS NSAIDs such as ibuprofen, Motrin, Advil, Aleve, or naproxen? YES once in a while, was taking naproxen; rarely OTC NSAIDs  Smoking: Do you smoke?  no  Snoring / sleep apnea: Do you snore or have sleep apnea?  no   Stress: Do you feel like you are under excessive stress or that your stress level affects your blood pressure at times?    no; better with the lexapro  Stroh's (alcohol): Do you drink alcohol  yes none in 3 weeks  --- if yes, do you drink more than 14 drinks/week if male or more than 7 drinks/week if male?  no  Sudafed (decongestants): Do you use any OTC decongestant products like Allegra-D,  Claritin-D, Zyrtec-D, Tylenol Cold and Sinus, etc.?  no   Hyperlipidemia; seeing endocrinologist now; TG down from >2000 to 862; total 221; HDL 36 He would like to return here He is on statin plus tricor; due for fasting labs soon  Depression screen [facility] 2/9 02/22/2017 06/22/2016  Decreased Interest 0 0  Down, Depressed, Hopeless 0 0  PHQ - 2 Score 0 0   Relevant past medical, surgical, family and social history reviewed Past Medical History:  Diagnosis Date  . Ankle fracture   . Back pain    Herniated Disk  . Chronic kidney disease   . Elevated liver enzymes   . Fatty liver   . Hyperlipidemia   . Hypertension   . Hypogonadism in male   . IFG (impaired fasting glucose)    Family History  Problem Relation Age of Onset  . Cancer Father     lung  . Alzheimer's disease Father   . Hyperlipidemia Paternal Grandmother    Social History  Substance Use Topics  . Smoking status: Former Smoker    Years: 25.00    Types: Cigarettes    Quit date: 11/20/2003  . Smokeless tobacco: Current User  . Alcohol use Yes     Comment: on occasion    Interim medical history since  last visit reviewed. Allergies and medications reviewed  Review of Systems Per HPI unless specifically indicated above     Objective:    BP 124/78   Pulse (!) 109   Temp 97.9 F (36.6 C) (Oral)   Resp 14   Wt 219 lb (99.3 kg)   SpO2 95%   BMI 28.89 kg/m   Wt Readings from Last 3 Encounters:  02/22/17 219 lb (99.3 kg)  02/15/17 219 lb 9.6 oz (99.6 kg)  09/15/16 213 lb (96.6 kg)    Physical Exam  Constitutional: He appears well-developed and well-nourished. No distress.  Neck: No thyromegaly present.  Cardiovascular: Normal rate and regular rhythm.   Pulmonary/Chest: Effort normal and breath sounds normal.  Abdominal: He exhibits no distension.  Skin: Skin is warm.  No xanthalasma  Psychiatric: He has a normal mood and affect. His mood appears not anxious. His affect is not blunt. He does not exhibit a  depressed mood.      Assessment & Plan:   Problem List Items Addressed This Visit      Cardiovascular and Mediastinum   Essential hypertension, benign - Primary    DASH guidelines, healthy lifestyle recommended; stop the NSAID; tylenol will not affect BP; continue ACE-I at current level      Relevant Medications   fenofibrate (TRICOR) 145 MG tablet     Other   Medication monitoring encounter    Check labs      Relevant Orders   COMPLETE METABOLIC PANEL WITH GFR   Hypertriglyceridemia    Patient wishes to have lipids managed here and stay on current therapy; will check labs truly fasting; discussed genetic aspect; healthy diet encouraged      Relevant Medications   fenofibrate (TRICOR) 145 MG tablet   Other Relevant Orders   Lipid panel   Dyslipidemia    Continue statin plus fibrate per endo; discussed increased risk of myopathy on combination; reasons to stop med and seek treatment discussed; healthy eating encouraged      Relevant Medications   fenofibrate (TRICOR) 145 MG tablet   Other Relevant Orders   Lipid panel    Other Visit Diagnoses    Need for TD vaccine       Need for diphtheria-tetanus-pertussis (Tdap) vaccine       Relevant Orders   Tdap vaccine greater than or equal to 7yo IM (Completed)       Follow up plan: Return in about 6 months (around 09/02/2017) for twenty minute follow-up with fasting labs.  An after-visit summary was printed and given to the patient at [place].  Please see the patient instructions which may contain other information and recommendations beyond what is mentioned above in the assessment and plan.  Meds ordered this encounter  Medications  . fenofibrate (TRICOR) 145 MG tablet    Sig: Take 145 mg by mouth daily.    Orders Placed This Encounter  Procedures  . Tdap vaccine greater than or equal to 7yo IM  . Lipid panel  . COMPLETE METABOLIC PANEL WITH GFR   Face-to-face time with patient was more than 25 minutes, >50%  time spent counseling and coordination of care

## 2017-02-22 NOTE — Assessment & Plan Note (Signed)
DASH guidelines, healthy lifestyle recommended; stop the NSAID; tylenol will not affect BP; continue ACE-I at current level

## 2017-02-22 NOTE — Patient Instructions (Addendum)
Return for completely fasting labs next week or the week after Stop the naproxen If you need something for aches or pains, try to use Tylenol (acetaminophen) instead of non-steroidals (which include Aleve, ibuprofen, Advil, Motrin, and naproxen); non-steroidals can cause long-term kidney damage and elevate blood pressure Your goal blood pressure is less than 140 mmHg on top. Try to follow the DASH guidelines (DASH stands for Dietary Approaches to Stop Hypertension) Try to limit the sodium in your diet.  Ideally, consume less than 1.5 grams (less than 1,500mg ) per day. Do not add salt when cooking or at the table.  Check the sodium amount on labels when shopping, and choose items lower in sodium when given a choice. Avoid or limit foods that already contain a lot of sodium. Eat a diet rich in fruits and vegetables and whole grains.  Food Choices to Lower Your Triglycerides Triglycerides are a type of fat in your blood. High levels of triglycerides can increase the risk of heart disease and stroke. If your triglyceride levels are high, the foods you eat and your eating habits are very important. Choosing the right foods can help lower your triglycerides. What general guidelines do I need to follow?  Lose weight if you are overweight.  Limit or avoid alcohol.  Fill one half of your plate with vegetables and green salads.  Limit fruit to two servings a day. Choose fruit instead of juice.  Make one fourth of your plate whole grains. Look for the word "whole" as the first word in the ingredient list.  Fill one fourth of your plate with lean protein foods.  Enjoy fatty fish (such as salmon, mackerel, sardines, and tuna) three times a week.  Choose healthy fats.  Limit foods high in starch and sugar.  Eat more home-cooked food and less restaurant, buffet, and fast food.  Limit fried foods.  Cook foods using methods other than frying.  Limit saturated fats.  Check ingredient lists to  avoid foods with partially hydrogenated oils (trans fats) in them. What foods can I eat? Grains  Whole grains, such as whole wheat or whole grain breads, crackers, cereals, and pasta. Unsweetened oatmeal, bulgur, barley, quinoa, or brown rice. Corn or whole wheat flour tortillas. Vegetables  Fresh or frozen vegetables (raw, steamed, roasted, or grilled). Green salads. Fruits  All fresh, canned (in natural juice), or frozen fruits. Meat and Other Protein Products  Ground beef (85% or leaner), grass-fed beef, or beef trimmed of fat. Skinless chicken or [place]. Ground chicken or [place]. Pork trimmed of fat. All fish and seafood. Eggs. Dried beans, peas, or lentils. Unsalted nuts or seeds. Unsalted canned or dry beans. Dairy  Low-fat dairy products, such as skim or 1% milk, 2% or reduced-fat cheeses, low-fat ricotta or cottage cheese, or plain low-fat yogurt. Fats and Oils  Tub margarines without trans fats. Light or reduced-fat mayonnaise and salad dressings. Avocado. Safflower, olive, or canola oils. Natural peanut or almond butter. The items listed above may not be a complete list of recommended foods or beverages. Contact your dietitian for more options.  What foods are not recommended? Grains  White bread. White pasta. White rice. Cornbread. Bagels, pastries, and croissants. Crackers that contain trans fat. Vegetables  White potatoes. Corn. Creamed or fried vegetables. Vegetables in a cheese sauce. Fruits  Dried fruits. Canned fruit in light or heavy syrup. Fruit juice. Meat and Other Protein Products  Fatty cuts of meat. Ribs, chicken wings, bacon, sausage, bologna, salami, chitterlings, fatback, hot dogs,  bratwurst, and packaged luncheon meats. Dairy  Whole or 2% milk, cream, half-and-half, and cream cheese. Whole-fat or sweetened yogurt. Full-fat cheeses. Nondairy creamers and whipped toppings. Processed cheese, cheese spreads, or cheese curds. Sweets and Desserts  Corn syrup, sugars,  honey, and molasses. Candy. Jam and jelly. Syrup. Sweetened cereals. Cookies, pies, cakes, donuts, muffins, and ice cream. Fats and Oils  Butter, stick margarine, lard, shortening, ghee, or bacon fat. Coconut, palm kernel, or palm oils. Beverages  Alcohol. Sweetened drinks (such as sodas, lemonade, and fruit drinks or punches). The items listed above may not be a complete list of foods and beverages to avoid. Contact your dietitian for more information.  This information is not intended to replace advice given to you by your health care provider. Make sure you discuss any questions you have with your health care provider. Document Released: 08/23/2004 Document Revised: 04/12/2016 Document Reviewed: 09/09/2013 Elsevier Interactive Patient Education  2017 [employer].

## 2017-02-22 NOTE — Assessment & Plan Note (Signed)
Check labs 

## 2017-02-22 NOTE — Assessment & Plan Note (Signed)
Continue statin plus fibrate per endo; discussed increased risk of myopathy on combination; reasons to stop med and seek treatment discussed; healthy eating encouraged

## 2017-02-22 NOTE — Assessment & Plan Note (Signed)
Patient wishes to have lipids managed here and stay on current therapy; will check labs truly fasting; discussed genetic aspect; healthy diet encouraged

## 2017-04-20 ENCOUNTER — Other Ambulatory Visit: Payer: Self-pay | Admitting: Family Medicine

## 2017-09-01 IMAGING — CR DG ELBOW COMPLETE 3+V*R*
2 series · 2 of 2 positions shown · non-contrast
Comparison: None.

CLINICAL DATA: Pain after fall

EXAM:
RIGHT ELBOW - COMPLETE 3+ VIEW

[elbow ap]
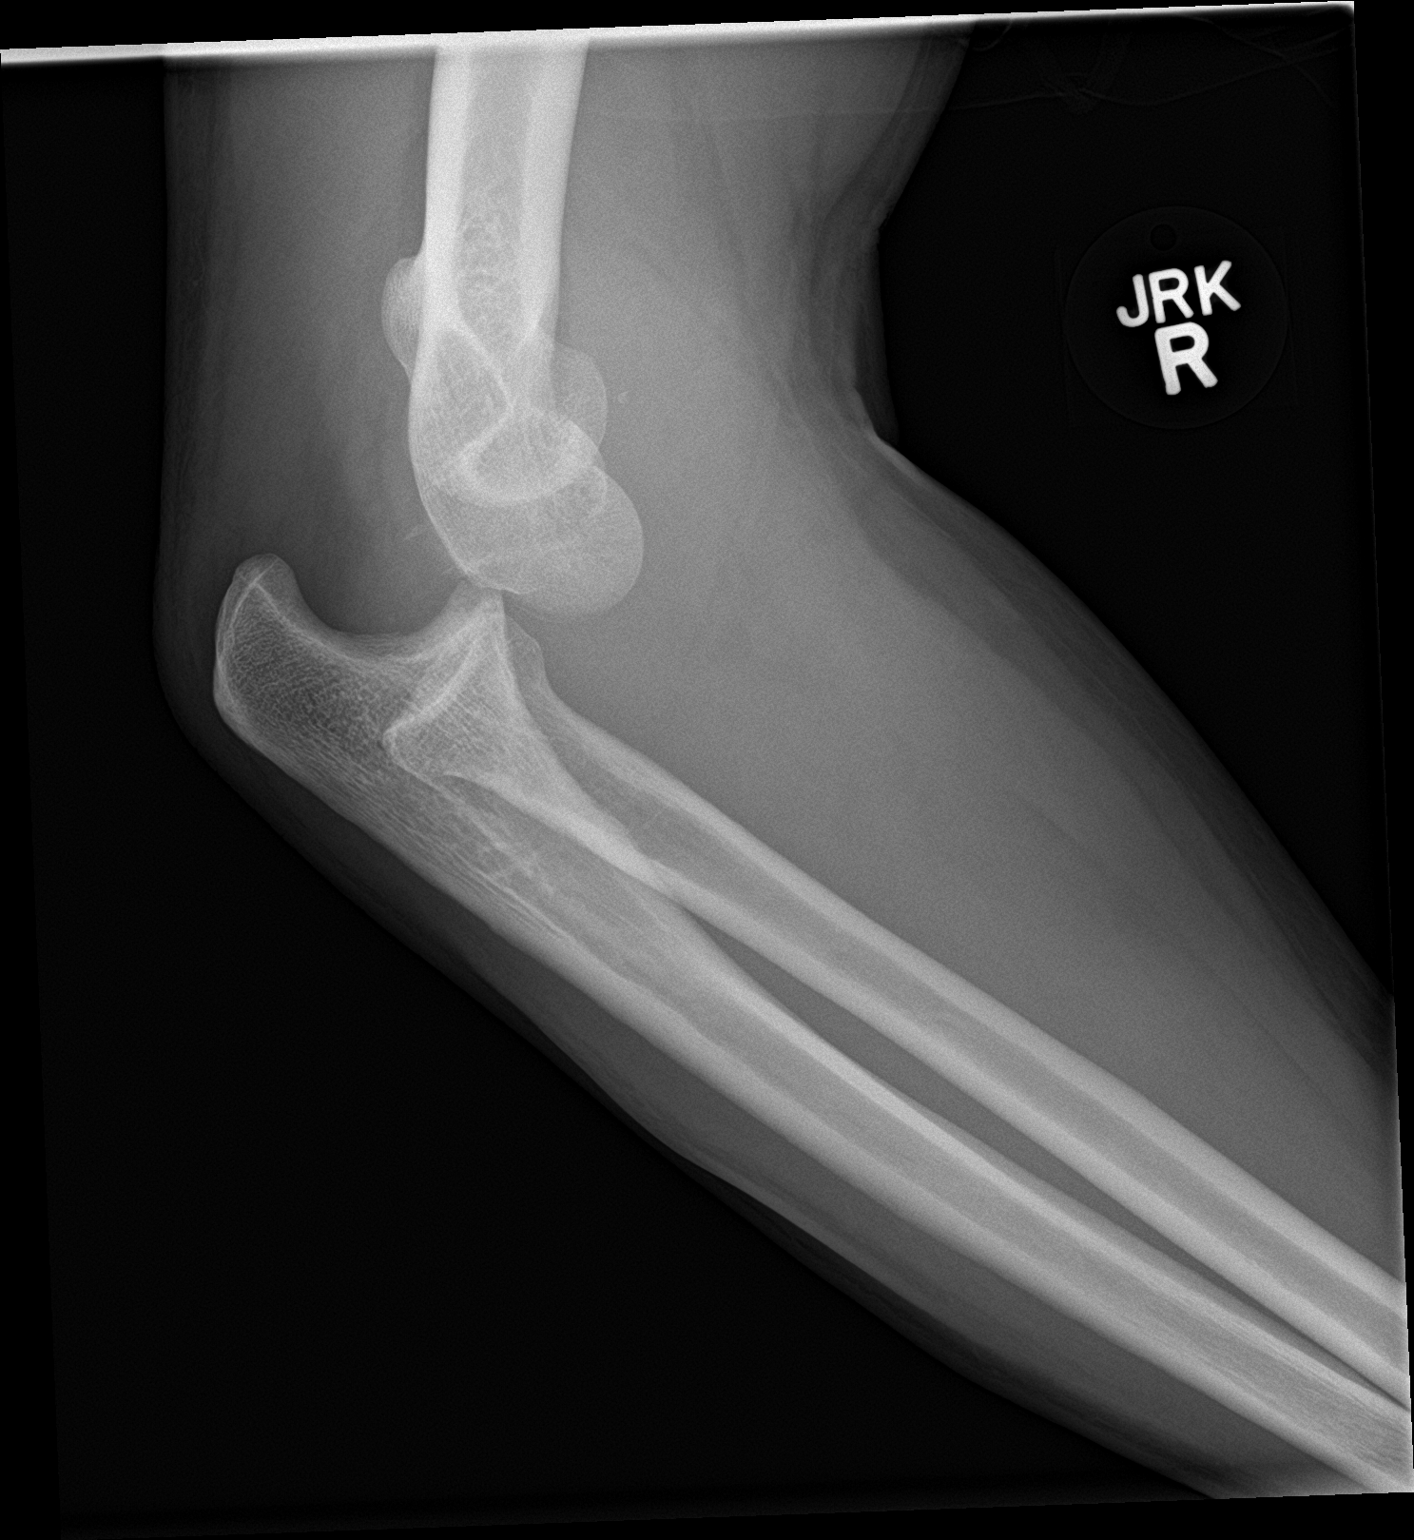

[elbow obl]
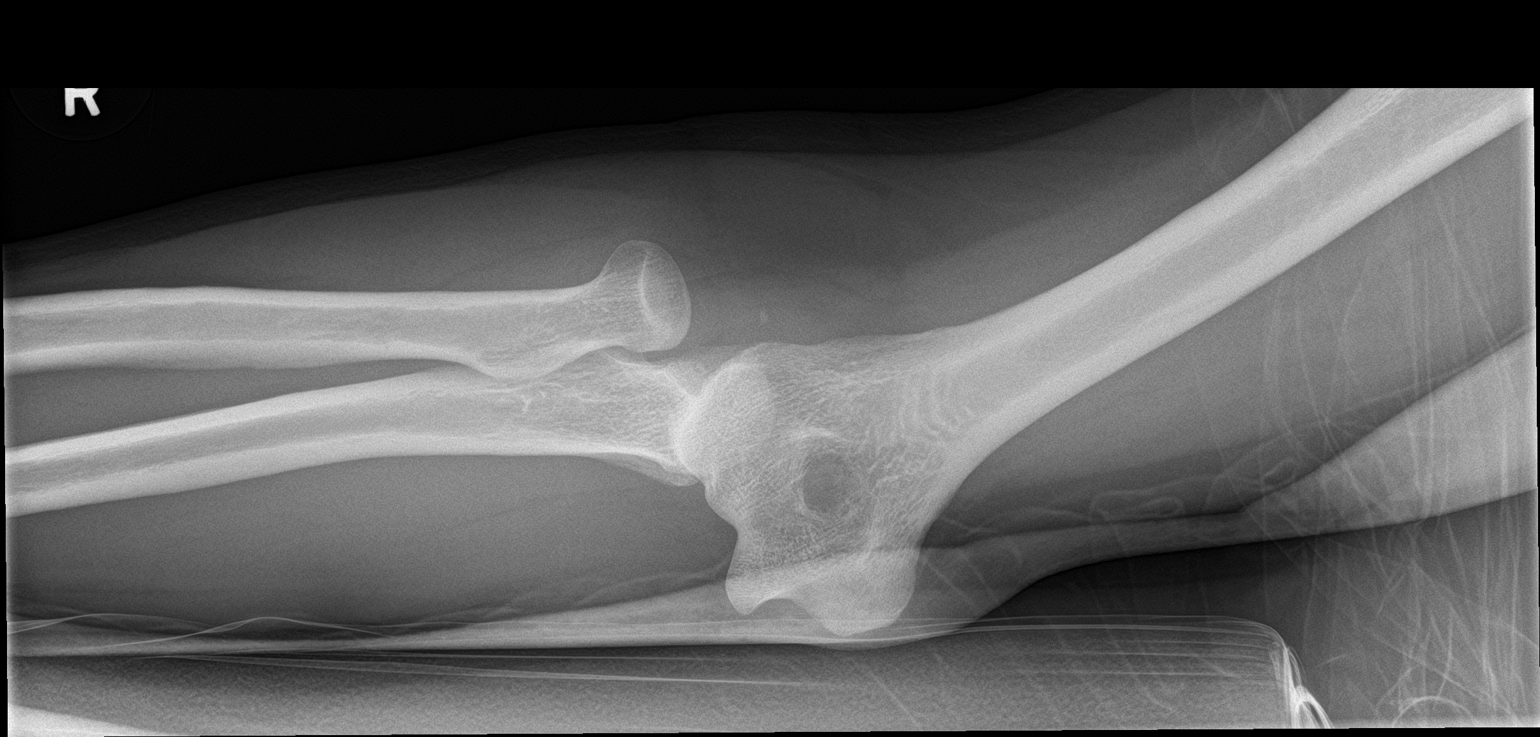

[2 of 2 positions shown; findings below may reference images not displayed]

FINDINGS: The right elbow is dislocated. The humerus is disarticulated from
the radius and ulna. There are several small calcifications adjacent
to the distal humerus.
IMPRESSION: Right elbow dislocation. The small calcifications adjacent to the
distal humerus likely represent small fracture fragments. Donor
sites are not clearly seen.

## 2017-09-01 IMAGING — DX DG ELBOW 2V*R*
2 series · 2 of 2 positions shown · non-contrast
Comparison: Earlier radiograph dated 09/15/2016

CLINICAL DATA: 50-year-old male status post reduction of the right
elbow dislocation.

EXAM:
RIGHT ELBOW - 2 VIEW

[elbow ap]
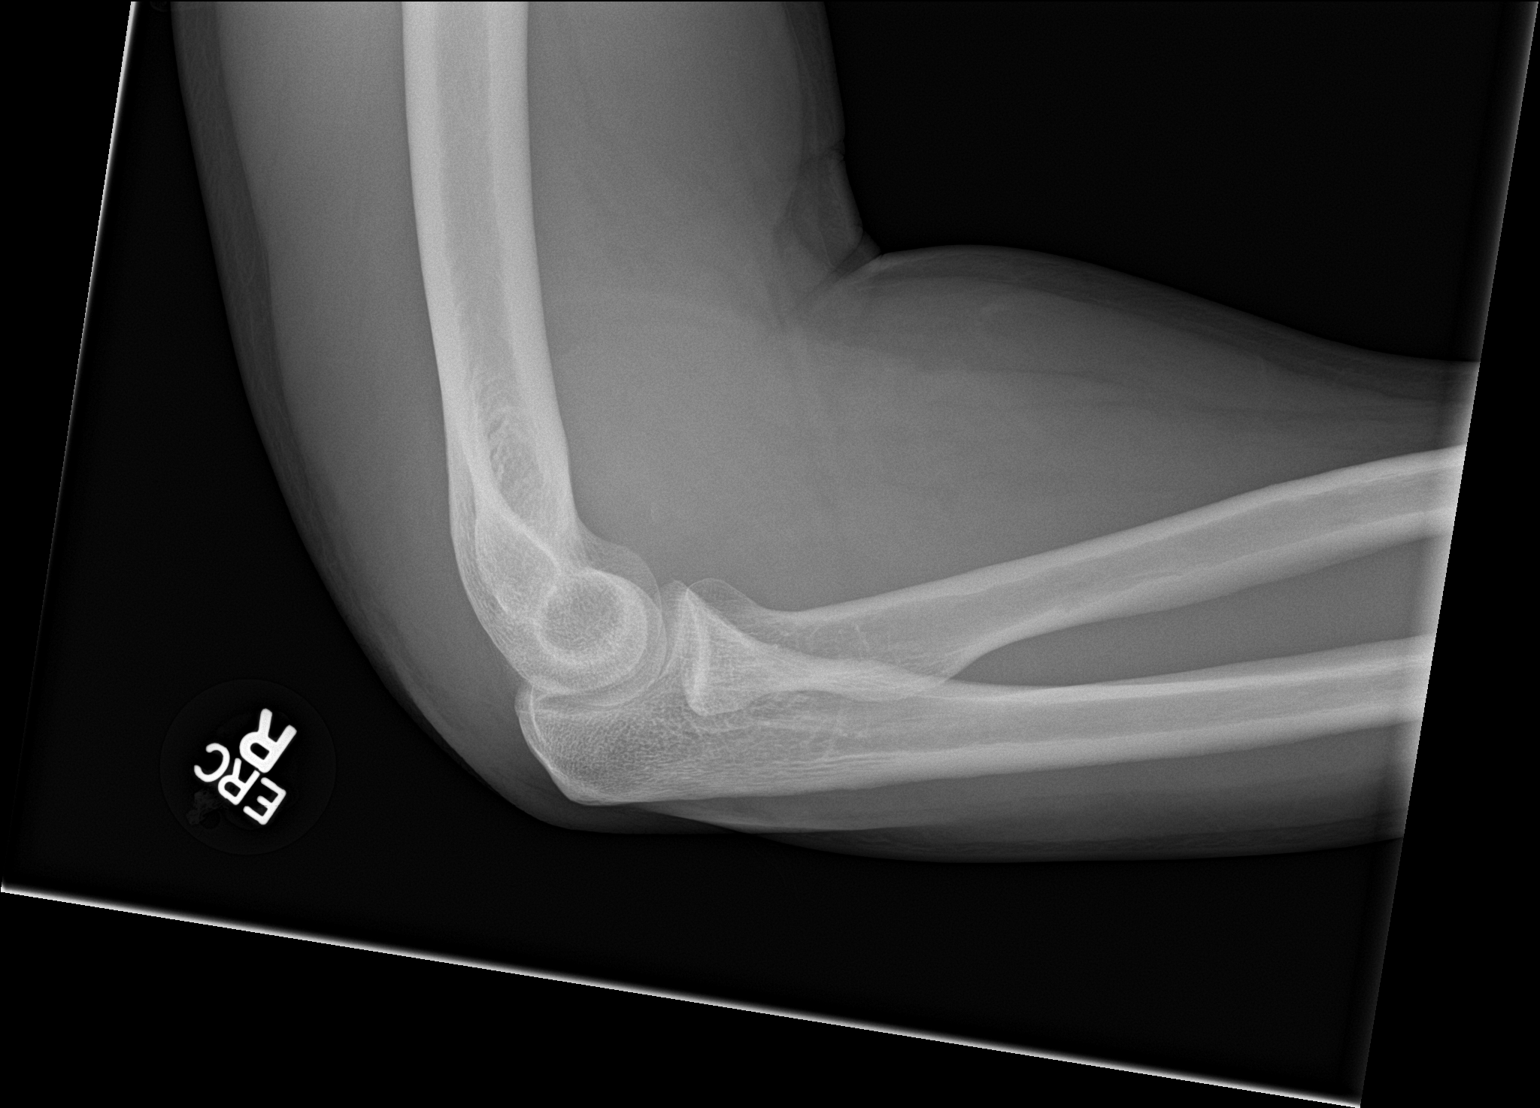

[elbow lat]
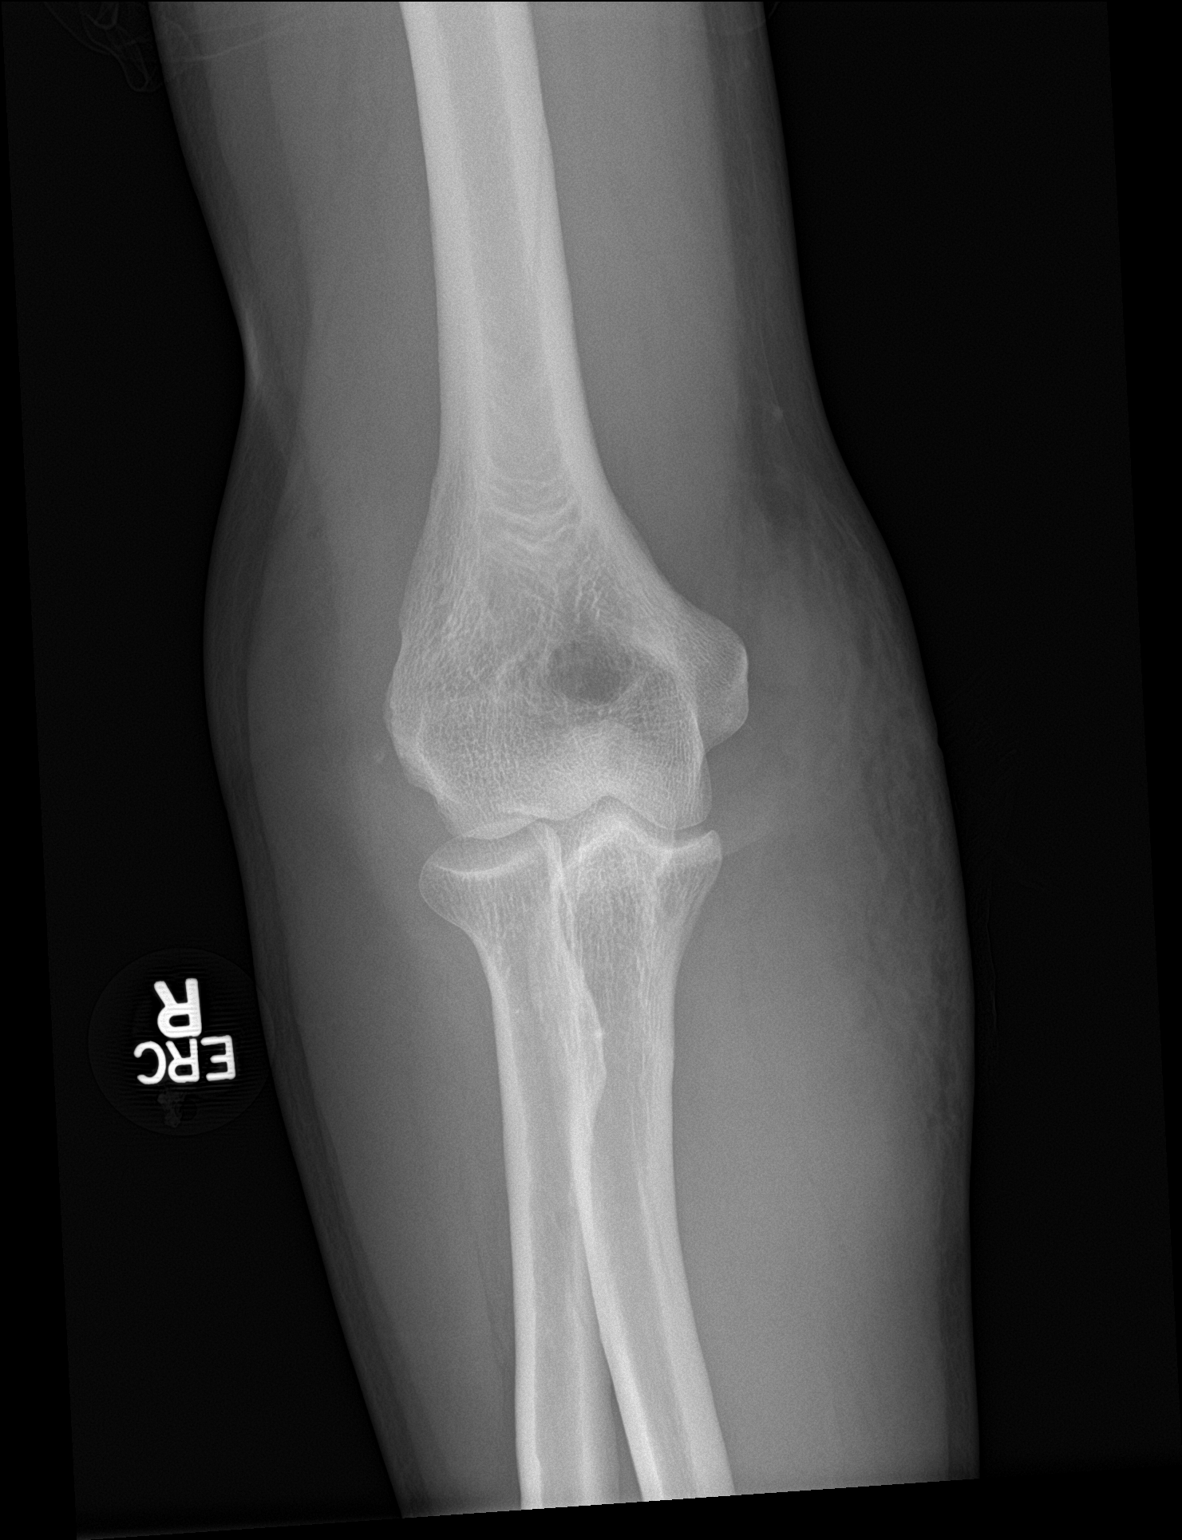

[2 of 2 positions shown; findings below may reference images not displayed]

FINDINGS: There has been interval reduction of the previously seen right elbow
dislocation now in anatomic alignment. Tiny bone fragment adjacent
to the lateral epicondyle on the AP view and anterior to the elbow
joint on the lateral projection represent a small fracture fragment
of indeterminate donor site possibly tip of the coronoid process or
posterior cortex of trochlea. There is diffuse soft tissue swelling
of the elbow. No radiopaque foreign object.
IMPRESSION: Interval reduction of the previously seen dislocated elbow now in
anatomic alignment. Small fracture fragment possibly from the tip of
the coronoid process or posterior cortex of the trochlea.

## 2017-12-23 ENCOUNTER — Other Ambulatory Visit: Payer: Self-pay | Admitting: Family Medicine

## 2017-12-23 NOTE — Telephone Encounter (Signed)
Patient is due for labs Please ask him to come in for an appt with me in the next 3-4 weeks and I'll refill his BP medicine, but we do need to check kidney function, other labs Thank you

## 2017-12-23 NOTE — Telephone Encounter (Signed)
LVM for pt to call the office and schedule an appt

## 2018-01-02 ENCOUNTER — Other Ambulatory Visit: Payer: Self-pay

## 2018-01-02 MED ORDER — ATORVASTATIN CALCIUM 40 MG PO TABS: 40.0000 mg | ORAL_TABLET | Freq: Every day | ORAL | 0 refills | Status: DC

## 2018-01-02 MED ORDER — FENOFIBRATE 145 MG PO TABS: 145.0000 mg | ORAL_TABLET | Freq: Every day | ORAL | 0 refills | Status: DC

## 2018-01-02 NOTE — Telephone Encounter (Signed)
I am going to guess that he is no longer seeing Dr. Gabriel Carina, then I'll refill the medicines and we look forward to seeing him soon Since we are concerned about his triglycerides, we DO want him to come fasting for his labs Thank you

## 2018-01-02 NOTE — Telephone Encounter (Signed)
Copied from [place] [id number]. Topic: Quick Communication - See Telephone Encounter >> Jan 02, 2018  1:46 PM Oneta Rack wrote:  Relation to HY:WVPX Call back number:[phone] Pharmacy: [facility], [place] [phone] (Phone) [phone] (Fax)  Reason for call:  ref Patient returned call regarding appointment for 01/03/18 needing to be [facility], patient [facility] appointment to 01/24/18, patient would like to know if PCP will refill fenofibrate (TRICOR) 145 MG tablet and atorvastatin (LIPITOR) 40 MG tablet  until appointment, please advise >> Jan 02, 2018  1:50 PM Oneta Rack wrote:  Relation to GH:WEXH Call back number:[phone] Pharmacy: [facility], [place] [phone] (Phone) [phone] (Fax)  Reason for call:  ref Patient returned call regarding appointment for 01/03/18 needing to be [facility], patient [facility] appointment to 01/24/18, patient would like to know if PCP will refill fenofibrate (TRICOR) 145 MG tablet and atorvastatin (LIPITOR) 40 MG tablet  until appointment, please advise

## 2018-01-03 ENCOUNTER — Ambulatory Visit: Payer: BLUE CROSS/BLUE SHIELD | Admitting: Family Medicine

## 2018-01-11 ENCOUNTER — Other Ambulatory Visit: Payer: Self-pay | Admitting: Family Medicine

## 2018-01-11 NOTE — Telephone Encounter (Signed)
Patient has an appt in March Rx approved

## 2018-01-24 ENCOUNTER — Ambulatory Visit (INDEPENDENT_AMBULATORY_CARE_PROVIDER_SITE_OTHER): Payer: BLUE CROSS/BLUE SHIELD | Admitting: Family Medicine

## 2018-01-24 ENCOUNTER — Encounter: Payer: Self-pay | Admitting: Family Medicine

## 2018-01-24 VITALS — BP 126/84 | HR 95 | Temp 98.5°F | Resp 16 | Wt 228.3 lb

## 2018-01-24 DIAGNOSIS — Z1211 Encounter for screening for malignant neoplasm of colon: Secondary | ICD-10-CM

## 2018-01-24 DIAGNOSIS — N289 Disorder of kidney and ureter, unspecified: Secondary | ICD-10-CM

## 2018-01-24 DIAGNOSIS — G8929 Other chronic pain: Secondary | ICD-10-CM

## 2018-01-24 DIAGNOSIS — R740 Nonspecific elevation of levels of transaminase and lactic acid dehydrogenase [LDH]: Secondary | ICD-10-CM

## 2018-01-24 DIAGNOSIS — D171 Benign lipomatous neoplasm of skin and subcutaneous tissue of trunk: Secondary | ICD-10-CM | POA: Diagnosis not present

## 2018-01-24 DIAGNOSIS — S53441D Ulnar collateral ligament sprain of right elbow, subsequent encounter: Secondary | ICD-10-CM

## 2018-01-24 DIAGNOSIS — R7303 Prediabetes: Secondary | ICD-10-CM | POA: Insufficient documentation

## 2018-01-24 DIAGNOSIS — M25511 Pain in right shoulder: Secondary | ICD-10-CM | POA: Diagnosis not present

## 2018-01-24 DIAGNOSIS — Z5181 Encounter for therapeutic drug level monitoring: Secondary | ICD-10-CM

## 2018-01-24 DIAGNOSIS — E781 Pure hyperglyceridemia: Secondary | ICD-10-CM | POA: Diagnosis not present

## 2018-01-24 DIAGNOSIS — F1010 Alcohol abuse, uncomplicated: Secondary | ICD-10-CM

## 2018-01-24 DIAGNOSIS — I10 Essential (primary) hypertension: Secondary | ICD-10-CM

## 2018-01-24 DIAGNOSIS — E669 Obesity, unspecified: Secondary | ICD-10-CM | POA: Insufficient documentation

## 2018-01-24 DIAGNOSIS — E66811 Obesity, class 1: Secondary | ICD-10-CM

## 2018-01-24 DIAGNOSIS — R7301 Impaired fasting glucose: Secondary | ICD-10-CM | POA: Diagnosis not present

## 2018-01-24 DIAGNOSIS — D179 Benign lipomatous neoplasm, unspecified: Secondary | ICD-10-CM | POA: Insufficient documentation

## 2018-01-24 DIAGNOSIS — R7401 Elevation of levels of liver transaminase levels: Secondary | ICD-10-CM

## 2018-01-24 DIAGNOSIS — H9313 Tinnitus, bilateral: Secondary | ICD-10-CM | POA: Insufficient documentation

## 2018-01-24 NOTE — Assessment & Plan Note (Signed)
Doing well with that right now; let it get away with him for a bit, stress relieving discussed, pray and not worry about it

## 2018-01-24 NOTE — Assessment & Plan Note (Signed)
Controlled with occasional NSAID

## 2018-01-24 NOTE — Patient Instructions (Addendum)
Check out the information at familydoctor.org entitled "Nutrition for Weight Loss: What You Need to Know about Fad Diets" Try to lose between 1-2 pounds per week by taking in fewer calories and burning off more calories You can succeed by limiting portions, limiting foods dense in calories and fat, becoming more active, and drinking 8 glasses of water a day (64 ounces) Don't skip meals, especially breakfast, as skipping meals may alter your metabolism Do not use over-the-counter weight loss pills or gimmicks that claim rapid weight loss A healthy BMI (or body mass index) is between 18.5 and 24.9 You can calculate your ideal BMI at the [place] website ClubMonetize.fr Try to follow the DASH guidelines (DASH stands for Dietary Approaches to Stop Hypertension). Try to limit the sodium in your diet to no more than 1,500mg  of sodium per day. Certainly try to not exceed 2,000 mg per day at the very most. Do not add salt when cooking or at the table.  Check the sodium amount on labels when shopping, and choose items lower in sodium when given a choice. Avoid or limit foods that already contain a lot of sodium. Eat a diet rich in fruits and vegetables and whole grains, and try to lose weight if overweight or obese  Preventing Unhealthy Weight Gain, Adult Staying at a healthy weight is important. When fat builds up in your body, you may become overweight or obese. These conditions put you at greater risk for developing certain health problems, such as heart disease, diabetes, sleeping problems, joint problems, and some cancers. Unhealthy weight gain is often the result of making unhealthy choices in what you eat. It is also a result of not getting enough exercise. You can make changes to your lifestyle to prevent obesity and stay as healthy as possible. What nutrition changes can be made? To maintain a healthy weight and prevent obesity:  Eat only as much as your  body needs. To do this: ? Pay attention to signs that you are hungry or full. Stop eating as soon as you feel full. ? If you feel hungry, try drinking water first. Drink enough water so your urine is clear or pale yellow. ? Eat smaller portions. ? Look at serving sizes on food labels. Most foods contain more than one serving per container. ? Eat the recommended amount of calories for your gender and activity level. While most active people should eat around 2,000 calories per day, if you are trying to lose weight or are not very active, you main need to eat less calories. Talk to your health care provider or dietitian about how many calories you should eat each day.  Choose healthy foods, such as: ? Fruits and vegetables. Try to fill at least half of your plate at each meal with fruits and vegetables. ? Whole grains, such as whole wheat bread, brown rice, and quinoa. ? Lean meats, such as chicken or fish. ? Other healthy proteins, such as beans, eggs, or tofu. ? Healthy fats, such as nuts, seeds, fatty fish, and olive oil. ? Low-fat or fat-free dairy.  Check food labels and avoid food and drinks that: ? Are high in calories. ? Have added sugar. ? Are high in sodium. ? Have saturated fats or trans fats.  Limit how much you eat of the following foods: ? Prepackaged meals. ? Fast food. ? Fried foods. ? Processed meat, such as bacon, sausage, and deli meats. ? Fatty cuts of red meat and poultry with skin.  Cook foods  in healthier ways, such as by baking, broiling, or grilling.  When grocery shopping, try to shop around the outside of the store. This helps you buy mostly fresh foods and avoid canned and prepackaged foods.  What lifestyle changes can be made?  Exercise at least 30 minutes 5 or more days each week. Exercising includes brisk walking, yard work, biking, running, swimming, and team sports like basketball and soccer. Ask your health care provider which exercises are safe for  you.  Do not use any products that contain nicotine or tobacco, such as cigarettes and e-cigarettes. If you need help quitting, ask your health care provider.  Limit alcohol intake to no more than 1 drink a day for nonpregnant women and 2 drinks a day for men. One drink equals 12 oz of beer, 5 oz of wine, or 1 oz of hard liquor.  Try to get 7-9 hours of sleep each night. What other changes can be made?  Keep a food and activity journal to keep track of: ? What you ate and how many calories you had. Remember to count sauces, dressings, and side dishes. ? Whether you were active, and what exercises you did. ? Your calorie, weight, and activity goals.  Check your weight regularly. Track any changes. If you notice you have gained weight, make changes to your diet or activity routine.  Avoid taking weight-loss medicines or supplements. Talk to your health care provider before starting any new medicine or supplement.  Talk to your health care provider before trying any new diet or exercise plan. Why are these changes important? Eating healthy, staying active, and having healthy habits not only help prevent obesity, they also:  Help you to manage stress and emotions.  Help you to connect with friends and family.  Improve your self-esteem.  Improve your sleep.  Prevent long-term health problems.  What can happen if changes are not made? Being obese or overweight can cause you to develop joint or bone problems, which can make it hard for you to stay active or do activities you enjoy. Being obese or overweight also puts stress on your heart and lungs and can lead to health problems like diabetes, heart disease, and some cancers. Where to find more information: Talk with your health care provider or a dietitian about healthy eating and healthy lifestyle choices. You may also find other information through these resources:  U.S. Department of Agriculture MyPlate:  FormerBoss.no  American Heart Association: www.heart.org  Centers for Disease Control and Prevention: http://www.wolf.info/  Summary  Staying at a healthy weight is important. It helps prevent certain diseases and health problems, such as heart disease, diabetes, joint problems, sleep disorders, and some cancers.  Being obese or overweight can cause you to develop joint or bone problems, which can make it hard for you to stay active or do activities you enjoy.  You can prevent unhealthy weight gain by eating a healthy diet, exercising regularly, not smoking, limiting alcohol, and getting enough sleep.  Talk with your health care provider or a dietitian for guidance about healthy eating and healthy lifestyle choices. This information is not intended to replace advice given to you by your health care provider. Make sure you discuss any questions you have with your health care provider. Document Released: 11/06/2016 Document Revised: 12/12/2016 Document Reviewed: 12/12/2016 Elsevier Interactive Patient Education  [employer].  If you have not heard anything from my staff in a week about any orders/referrals/studies from today, please contact us here to  follow-up [phone]

## 2018-01-24 NOTE — Assessment & Plan Note (Signed)
Controlled today; try to lose weight; follow DASH guidelines; continue meds

## 2018-01-24 NOTE — Assessment & Plan Note (Signed)
previusly managed by endo; patient wishes to have [place] prescribe and manage

## 2018-01-24 NOTE — Assessment & Plan Note (Signed)
Benign; keep an eye on it

## 2018-01-24 NOTE — Assessment & Plan Note (Signed)
Check glucose and A1c 

## 2018-01-24 NOTE — Assessment & Plan Note (Signed)
Check Cr 

## 2018-01-24 NOTE — Progress Notes (Signed)
BP 126/84   Pulse 95   Temp 98.5 F (36.9 C) (Oral)   Resp 16   Wt 228 lb 4.8 oz (103.6 kg)   SpO2 93%   BMI 30.12 kg/m    Subjective:    Patient ID: Jesus Barber, male    DOB: [birth year], 52 y.o.   MRN: [id number]  HPI: Jesus Barber is a 52 y.o. male  Chief Complaint  Patient presents with  . Follow-up  . Medication Refill    HPI  Patient is here for f/u  HTN; had DOT physical on Monday; well-controlled; taking lisinopril; no missed doses; no coughing; no HA, blurry vision, chest pain  High cholesterol; was following with Dr. Gabriel Carina; last lipids were done in 2017; just started OTC fish oil; missed a few weeks of the cholesterol medicine Asked about diet; eats a lot of sandwiches; truck driver, which makes it more difficult; trying to eat better Does not drink much caffeine; drinking Sprite trying to do better; six a week  Lab Results  Component Value Date   CHOL 435 (H) 07/20/2016   CHOL 148 01/13/2016   CHOL 263 (H) 12/02/2015   Lab Results  Component Value Date   HDL 31 (L) 07/20/2016   HDL 32 (L) 01/13/2016   HDL 28 (L) 12/02/2015   Lab Results  Component Value Date   LDLCALC NOT CALC 07/20/2016   [place] Comment 01/13/2016   [place] Comment 12/02/2015   Lab Results  Component Value Date   TRIG 2,701 (H) 07/20/2016   TRIG 457 (H) 01/13/2016   TRIG 1,691 ([place]) 12/02/2015   Lab Results  Component Value Date   CHOLHDL 14.0 (H) 07/20/2016   No results found for: LDLDIRECT  Dislocated shoulder a while ago; only using NSAID occasionally  Obesity; offered help  Eye issues; he is going to schedule an appt with his eye doctor (Dr. Matilde Sprang or Dr. Ellin Mayhew)  He asked about a TV commercial; having some ringing in the ears; drives [employer]; not worried about it; uses Q-tips; uses ear buds; had hearing evaluation maybe 4-5 years ago; everything was fine  Skin lump on the right flank; getting smaller; doesn't hurt at all  Alcohol, less than  14  Depression screen [facility] 2/9 01/24/2018 02/22/2017 06/22/2016  Decreased Interest 0 0 0  Down, Depressed, Hopeless 0 0 0  PHQ - 2 Score 0 0 0    Relevant past medical, surgical, family and social history reviewed Past Medical History:  Diagnosis Date  . Ankle fracture   . Back pain    Herniated Disk  . Chronic kidney disease   . Elevated liver enzymes   . Fatty liver   . Hyperlipidemia   . Hypertension   . Hypogonadism in male   . IFG (impaired fasting glucose)    Past Surgical History:  Procedure Laterality Date  . BACK SURGERY     Discectomy   Family History  Problem Relation Age of Onset  . Cancer Father        lung  . Alzheimer's disease Father   . Hyperlipidemia Paternal Grandmother    Social History   Tobacco Use  . Smoking status: Former Smoker    Years: 25.00    Types: Cigarettes    Last attempt to quit: 11/20/2003    Years since quitting: 14.1  . Smokeless tobacco: Current User  Substance Use Topics  . Alcohol use: Yes    Comment: on occasion   . Drug use: No  Interim medical history since last visit reviewed. Allergies and medications reviewed  Review of Systems Per HPI unless specifically indicated above     Objective:    BP 126/84   Pulse 95   Temp 98.5 F (36.9 C) (Oral)   Resp 16   Wt 228 lb 4.8 oz (103.6 kg)   SpO2 93%   BMI 30.12 kg/m   Wt Readings from Last 3 Encounters:  01/24/18 228 lb 4.8 oz (103.6 kg)  02/22/17 219 lb (99.3 kg)  02/15/17 219 lb 9.6 oz (99.6 kg)    Physical Exam  Constitutional: He appears well-developed and well-nourished. No distress.  Gained 9 pounds since last visit  HENT:  Little bit of dried blood on the right (using Q-tips)  Neck: Carotid bruit is not present. No thyromegaly present.  Cardiovascular: Normal rate and regular rhythm.  Pulmonary/Chest: Effort normal and breath sounds normal.  Abdominal: He exhibits no distension.  Skin: Skin is warm.     No xanthalasma; soft mass along RIGHT flank,  no overlying skin changes  Psychiatric: He has a normal mood and affect. His mood appears not anxious. His affect is not blunt. He does not exhibit a depressed mood.    Results for orders placed or performed during the hospital encounter of 02/15/17  Dept of Transp dipstick, urine  Result Value Ref Range   Protein, ur NEGATIVE NEGATIVE mg/dL   Glucose, UA NEGATIVE NEGATIVE mg/dL   Specific Gravity, Urine 1.020 1.005 - 1.030   Hgb urine dipstick NEGATIVE NEGATIVE      Assessment & Plan:   Problem List Items Addressed This Visit      Cardiovascular and Mediastinum   Essential hypertension, benign - Primary (Chronic)    Controlled today; try to lose weight; follow DASH guidelines; continue meds      Relevant Medications   aspirin EC 81 MG tablet     Endocrine   Impaired fasting glucose (Chronic)    Check glucose and A1c      Relevant Orders   Hemoglobin A1c     Musculoskeletal and Integument   Tear of medial collateral ligament of elbow, right, subsequent encounter     Genitourinary   Renal insufficiency (Chronic)    Check Cr        Other   Tinnitus, bilateral    Patient not alarmed; no excessive aspirin; considered hearing eval; watch for now, let us know if getting worse      Shoulder pain, right    Controlled with occasional NSAID      Obesity (BMI 30.0-34.9) (Chronic)    Encouraged weight loss to help BP and cholesterol      Mild alcohol use disorder    Doing well with that right now; let it get away with him for a bit, stress relieving discussed, pray and not worry about it      Medication monitoring encounter    Check labs      Relevant Orders   COMPLETE METABOLIC PANEL WITH GFR   Lipoma    Benign; keep an eye on it      Hypertriglyceridemia (Chronic)    previusly managed by endo; patient wishes to have [place] prescribe and manage      Relevant Medications   aspirin EC 81 MG tablet   Other Relevant Orders   Lipid panel   Elevated serum  glutamic pyruvic transaminase (SGPT) level    Check liver enzymes today       Other Visit Diagnoses  Screen for colon cancer       Relevant Orders   Ambulatory referral to Gastroenterology       Follow up plan: No Follow-up on file.  An after-visit summary was printed and given to the patient at [facility].  Please see the patient instructions which may contain other information and recommendations beyond what is mentioned above in the assessment and plan.  No orders of the defined types were placed in this encounter.   Orders Placed This Encounter  Procedures  . COMPLETE METABOLIC PANEL WITH GFR  . Hemoglobin A1c  . Lipid panel  . Ambulatory referral to Gastroenterology

## 2018-01-24 NOTE — Assessment & Plan Note (Signed)
Check liver enzymes today 

## 2018-01-24 NOTE — Assessment & Plan Note (Signed)
Check labs 

## 2018-01-24 NOTE — Assessment & Plan Note (Signed)
Encouraged weight loss to help BP and cholesterol

## 2018-01-24 NOTE — Assessment & Plan Note (Signed)
Patient not alarmed; no excessive aspirin; considered hearing eval; watch for now, let us know if getting worse

## 2018-01-25 LAB — LIPID PANEL
Cholesterol: 358 mg/dL — ABNORMAL HIGH (ref <200)
HDL: 28 mg/dL — ABNORMAL LOW (ref >40)
NON-HDL CHOLESTEROL (CALC): 330 mg/dL — AB (ref <130)
TRIGLYCERIDES: 2451 mg/dL — AB (ref <150)
Total CHOL/HDL Ratio: 12.8 (calc) — ABNORMAL HIGH (ref <5.0)

## 2018-01-25 LAB — COMPLETE METABOLIC PANEL WITH GFR
AG RATIO: 2.1 (calc) (ref 1.0–2.5)
ALT: 33 U/L (ref 9–46)
AST: 25 U/L (ref 10–35)
Albumin: 4.8 g/dL (ref 3.6–5.1)
Alkaline phosphatase (APISO): 69 U/L (ref 40–115)
BUN: 14 mg/dL (ref 7–25)
CO2: 26 mmol/L (ref 20–32)
Calcium: 10 mg/dL (ref 8.6–10.3)
Chloride: 98 mmol/L (ref 98–110)
Creat: 1.14 mg/dL (ref 0.70–1.33)
GFR, EST AFRICAN AMERICAN: 86 mL/min/{1.73_m2} (ref >=60)
GFR, EST NON AFRICAN AMERICAN: 75 mL/min/{1.73_m2} (ref >=60)
Globulin: 2.3 g/dL (calc) (ref 1.9–3.7)
Glucose, Bld: 114 mg/dL — ABNORMAL HIGH (ref 65–99)
POTASSIUM: 4.1 mmol/L (ref 3.5–5.3)
Sodium: 135 mmol/L (ref 135–146)
TOTAL PROTEIN: 7.1 g/dL (ref 6.1–8.1)
Total Bilirubin: 0.7 mg/dL (ref 0.2–1.2)

## 2018-01-25 LAB — HEMOGLOBIN A1C
Hgb A1c MFr Bld: 6 % of total Hgb — ABNORMAL HIGH (ref <5.7)
Mean Plasma Glucose: 126 (calc)
eAG (mmol/L): 7 (calc)

## 2018-01-27 ENCOUNTER — Telehealth: Payer: Self-pay

## 2018-01-27 ENCOUNTER — Other Ambulatory Visit: Payer: Self-pay | Admitting: Family Medicine

## 2018-01-27 DIAGNOSIS — R7301 Impaired fasting glucose: Secondary | ICD-10-CM

## 2018-01-27 MED ORDER — METFORMIN HCL ER 500 MG PO TB24: 500.0000 mg | ORAL_TABLET | Freq: Every day | ORAL | 0 refills | Status: DC

## 2018-01-27 NOTE — Progress Notes (Signed)
Adding metformin for prediabetes; hopefully to help reduce gluconeogenesis

## 2018-01-27 NOTE — Assessment & Plan Note (Signed)
Start metformin.

## 2018-01-27 NOTE — Telephone Encounter (Signed)
Pt notified about metformin

## 2018-01-27 NOTE — Telephone Encounter (Signed)
Thank you :)

## 2018-01-28 ENCOUNTER — Other Ambulatory Visit: Payer: Self-pay | Admitting: Family Medicine

## 2018-02-25 ENCOUNTER — Encounter: Payer: Self-pay | Admitting: *Deleted

## 2018-02-26 ENCOUNTER — Other Ambulatory Visit: Payer: Self-pay | Admitting: Family Medicine

## 2018-04-26 ENCOUNTER — Other Ambulatory Visit: Payer: Self-pay | Admitting: Family Medicine

## 2018-04-26 DIAGNOSIS — E781 Pure hyperglyceridemia: Secondary | ICD-10-CM

## 2018-04-26 NOTE — Telephone Encounter (Signed)
Please ask patient to have fasting labs done this week to see how his numbers look on the medicine Lipids ordered already

## 2018-04-28 NOTE — Telephone Encounter (Signed)
Left voice mail

## 2018-05-09 ENCOUNTER — Other Ambulatory Visit: Payer: Self-pay

## 2018-05-09 DIAGNOSIS — E781 Pure hyperglyceridemia: Secondary | ICD-10-CM

## 2018-05-09 LAB — LIPID PANEL
Cholesterol: 171 mg/dL (ref <200)
HDL: 29 mg/dL — ABNORMAL LOW (ref >40)
NON-HDL CHOLESTEROL (CALC): 142 mg/dL — AB (ref <130)
TRIGLYCERIDES: 805 mg/dL — AB (ref <150)
Total CHOL/HDL Ratio: 5.9 (calc) — ABNORMAL HIGH (ref <5.0)

## 2018-05-19 ENCOUNTER — Ambulatory Visit (INDEPENDENT_AMBULATORY_CARE_PROVIDER_SITE_OTHER): Payer: BLUE CROSS/BLUE SHIELD | Admitting: Family Medicine

## 2018-05-19 ENCOUNTER — Other Ambulatory Visit: Payer: Self-pay

## 2018-05-19 ENCOUNTER — Emergency Department
Admission: EM | Admit: 2018-05-19 | Discharge: 2018-05-19 | Disposition: A | Discharge status: Home / Self Care | Payer: BLUE CROSS/BLUE SHIELD | Attending: Emergency Medicine | Admitting: Emergency Medicine

## 2018-05-19 ENCOUNTER — Encounter: Payer: Self-pay | Admitting: Family Medicine

## 2018-05-19 ENCOUNTER — Telehealth: Payer: Self-pay | Admitting: Family Medicine

## 2018-05-19 ENCOUNTER — Emergency Department: Payer: BLUE CROSS/BLUE SHIELD

## 2018-05-19 VITALS — BP 124/76 | HR 110 | Temp 99.0°F | Resp 18 | Ht 72.0 in | Wt 223.5 lb

## 2018-05-19 DIAGNOSIS — N189 Chronic kidney disease, unspecified: Secondary | ICD-10-CM | POA: Insufficient documentation

## 2018-05-19 DIAGNOSIS — J189 Pneumonia, unspecified organism: Secondary | ICD-10-CM | POA: Insufficient documentation

## 2018-05-19 DIAGNOSIS — R059 Cough, unspecified: Secondary | ICD-10-CM

## 2018-05-19 DIAGNOSIS — R52 Pain, unspecified: Secondary | ICD-10-CM

## 2018-05-19 DIAGNOSIS — F17228 Nicotine dependence, chewing tobacco, with other nicotine-induced disorders: Secondary | ICD-10-CM | POA: Diagnosis not present

## 2018-05-19 DIAGNOSIS — R8290 Unspecified abnormal findings in urine: Secondary | ICD-10-CM | POA: Insufficient documentation

## 2018-05-19 DIAGNOSIS — R109 Unspecified abdominal pain: Secondary | ICD-10-CM

## 2018-05-19 DIAGNOSIS — R809 Proteinuria, unspecified: Secondary | ICD-10-CM

## 2018-05-19 DIAGNOSIS — R509 Fever, unspecified: Secondary | ICD-10-CM | POA: Diagnosis not present

## 2018-05-19 DIAGNOSIS — M6282 Rhabdomyolysis: Secondary | ICD-10-CM | POA: Diagnosis not present

## 2018-05-19 DIAGNOSIS — R05 Cough: Secondary | ICD-10-CM | POA: Insufficient documentation

## 2018-05-19 DIAGNOSIS — R319 Hematuria, unspecified: Secondary | ICD-10-CM

## 2018-05-19 DIAGNOSIS — I129 Hypertensive chronic kidney disease with stage 1 through stage 4 chronic kidney disease, or unspecified chronic kidney disease: Secondary | ICD-10-CM | POA: Insufficient documentation

## 2018-05-19 DIAGNOSIS — R82998 Other abnormal findings in urine: Secondary | ICD-10-CM

## 2018-05-19 DIAGNOSIS — R51 Headache: Secondary | ICD-10-CM | POA: Diagnosis present

## 2018-05-19 LAB — CBC WITH DIFFERENTIAL/PLATELET
BASOS PCT: 1 %
Basophils Absolute: 0 10*3/uL (ref 0–0.1)
EOS ABS: 0 10*3/uL (ref 0–0.7)
EOS PCT: 1 %
HCT: 36.9 % — ABNORMAL LOW (ref 40.0–52.0)
HEMOGLOBIN: 13 g/dL (ref 13.0–18.0)
LYMPHS ABS: 0.7 10*3/uL — AB (ref 1.0–3.6)
Lymphocytes Relative: 10 %
MCH: 31.4 pg (ref 26.0–34.0)
MCHC: 35.2 g/dL (ref 32.0–36.0)
MCV: 89.2 fL (ref 80.0–100.0)
MONO ABS: 1 10*3/uL (ref 0.2–1.0)
MONOS PCT: 14 %
NEUTROS PCT: 74 %
Neutro Abs: 5.3 10*3/uL (ref 1.4–6.5)
PLATELETS: 197 10*3/uL (ref 150–440)
RBC: 4.14 MIL/uL — ABNORMAL LOW (ref 4.40–5.90)
RDW: 11.9 % (ref 11.5–14.5)
WBC: 7 10*3/uL (ref 3.8–10.6)

## 2018-05-19 LAB — URINALYSIS, COMPLETE (UACMP) WITH MICROSCOPIC
BACTERIA UA: NONE SEEN
BILIRUBIN URINE: NEGATIVE
Glucose, UA: NEGATIVE mg/dL
KETONES UR: NEGATIVE mg/dL
LEUKOCYTES UA: NEGATIVE
NITRITE: NEGATIVE
Protein, ur: 30 mg/dL — AB
SPECIFIC GRAVITY, URINE: 1.01 (ref 1.005–1.030)
Squamous Epithelial / LPF: NONE SEEN (ref 0–5)
pH: 7 (ref 5.0–8.0)

## 2018-05-19 LAB — COMPREHENSIVE METABOLIC PANEL
ALBUMIN: 3.9 g/dL (ref 3.5–5.0)
ALT: 169 U/L — ABNORMAL HIGH (ref 0–44)
ANION GAP: 8 (ref 5–15)
AST: 121 U/L — ABNORMAL HIGH (ref 15–41)
Alkaline Phosphatase: 66 U/L (ref 38–126)
BUN: 9 mg/dL (ref 6–20)
CALCIUM: 8.8 mg/dL — AB (ref 8.9–10.3)
CO2: 24 mmol/L (ref 22–32)
Chloride: 100 mmol/L (ref 98–111)
Creatinine, Ser: 1.13 mg/dL (ref 0.61–1.24)
GFR calc non Af Amer: >60 mL/min (ref >60)
GLUCOSE: 123 mg/dL — AB (ref 70–99)
Potassium: 3.3 mmol/L — ABNORMAL LOW (ref 3.5–5.1)
SODIUM: 132 mmol/L — AB (ref 135–145)
Total Bilirubin: 1.1 mg/dL (ref 0.3–1.2)
Total Protein: 7.6 g/dL (ref 6.5–8.1)

## 2018-05-19 LAB — POCT URINALYSIS DIPSTICK
Bilirubin, UA: NEGATIVE
Glucose, UA: NEGATIVE
Leukocytes, UA: NEGATIVE
Nitrite, UA: NEGATIVE
Protein, UA: POSITIVE — AB
Spec Grav, UA: 1.015 (ref 1.010–1.025)
Urobilinogen, UA: >=8 E.U./dL — AB
pH, UA: 6.5 (ref 5.0–8.0)

## 2018-05-19 LAB — CK
CK TOTAL: 899 U/L — AB (ref 49–397)
CK TOTAL: 886 U/L — AB (ref 49–397)

## 2018-05-19 LAB — TROPONIN I: Troponin I: <0.03 ng/mL (ref <0.03)

## 2018-05-19 MED ORDER — SODIUM CHLORIDE 0.9 % IV SOLN
500.0000 mg | Freq: Once | INTRAVENOUS | Status: AC
  Administered 2018-05-19: 500 mg via INTRAVENOUS
  Filled 2018-05-19: qty 500

## 2018-05-19 MED ORDER — ACETAMINOPHEN 500 MG PO TABS
1000.0000 mg | ORAL_TABLET | Freq: Once | ORAL | Status: AC
  Administered 2018-05-19: 1000 mg via ORAL
  Filled 2018-05-19: qty 2

## 2018-05-19 MED ORDER — SODIUM CHLORIDE 0.9 % IV SOLN
Freq: Once | INTRAVENOUS | Status: AC
Start: 2018-05-19 — End: 2018-05-19
  Administered 2018-05-19: 12:00:00 via INTRAVENOUS

## 2018-05-19 MED ORDER — HYDROCOD POLST-CPM POLST ER 10-8 MG/5ML PO SUER: 5.0000 mL | Freq: Two times a day (BID) | ORAL | 0 refills | Status: DC

## 2018-05-19 MED ORDER — LEVOFLOXACIN 750 MG PO TABS: 750.0000 mg | ORAL_TABLET | Freq: Every day | ORAL | 0 refills | Status: DC

## 2018-05-19 MED ORDER — SODIUM CHLORIDE 0.9 % IV SOLN
1000.0000 mL | Freq: Once | INTRAVENOUS | Status: AC
  Administered 2018-05-19: 1000 mL via INTRAVENOUS

## 2018-05-19 MED ORDER — SODIUM CHLORIDE 0.9 % IV SOLN
1.0000 g | Freq: Once | INTRAVENOUS | Status: AC
  Administered 2018-05-19: 1 g via INTRAVENOUS
  Filled 2018-05-19: qty 10

## 2018-05-19 MED ORDER — HYDROCOD POLST-CPM POLST ER 10-8 MG/5ML PO SUER
5.0000 mL | Freq: Once | ORAL | Status: AC
  Administered 2018-05-19: 5 mL via ORAL
  Filled 2018-05-19: qty 5

## 2018-05-19 NOTE — Patient Instructions (Signed)
Please go directly to [facility] ER for further evaluation.  Please call for a follow up appointment as soon as you are discharged.

## 2018-05-19 NOTE — Telephone Encounter (Signed)
I reviewed patient's ER visit, labs He was not admitted His liver transaminases are quite high Find out what they told him to do with his statin plus fenofibrate The combination of those two can contribute to rhabdo I'm going to recommend that he NOT take either of those until he is seen and his bloodwork is rechecked He also needs to NOT take his metformin if dehydrated, again until his labs are checked He needs to be seen tomorrow for recheck labs and visit

## 2018-05-19 NOTE — ED Triage Notes (Signed)
To ER via POV c/o headache, fever and dark urine since Thursday. Seen at PCP and referred to ER. Pt alert and oriented X4, active, cooperative, pt in NAD. RR even and unlabored, color WNL.

## 2018-05-19 NOTE — ED Provider Notes (Signed)
[facility] Emergency Department Provider Note       Time seen: ----------------------------------------- 9:11 AM on 05/19/2018 -----------------------------------------   I have reviewed the triage vital signs and the nursing notes.  HISTORY   Chief Complaint Headache; Fever; and Dark Urine    HPI Jesus Barber is a 52 y.o. male with a history of kidney disease, elevated liver enzymes, hyperlipidemia, hypertension who presents to the ED for headache, fever and dark urine since Thursday.  Patient was seen by his primary care doctor and referred to the ER for further evaluation.  There was concerns about possible rhabdomyolysis.  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  He has had a cough as well as fever and headache which is currently resolved.  Past Medical History:  Diagnosis Date  . Ankle fracture   . Back pain    Herniated Disk  . Chronic kidney disease   . Elevated liver enzymes   . Fatty liver   . Hyperlipidemia   . Hypertension   . Hypogonadism in male   . IFG (impaired fasting glucose)     Patient Active Problem List   Diagnosis Date Noted  . Obesity (BMI 30.0-34.9) 01/24/2018  . Lipoma 01/24/2018  . Impaired fasting glucose 01/24/2018  . Tinnitus, bilateral 01/24/2018  . Medication monitoring encounter 02/22/2017  . Hypertriglyceridemia 07/21/2016  . Tear of medial collateral ligament of elbow, right, subsequent encounter 01/25/2016  . Stress 01/13/2016  . Mild alcohol use disorder 12/04/2015  . Low serum testosterone level 08/14/2015  . Dyslipidemia 08/12/2015  . Essential hypertension, benign 08/12/2015  . Renal insufficiency 08/12/2015  . Elevated serum glutamic pyruvic transaminase (SGPT) level 08/12/2015  . Shoulder pain, right 08/12/2015    Past Surgical History:  Procedure Laterality Date  . BACK SURGERY     Discectomy    Allergies Patient has no known  allergies.  Social History Social History   Tobacco Use  . Smoking status: Former Smoker    Years: 25.00    Types: Cigarettes    Last attempt to quit: 11/20/2003    Years since quitting: 14.5  . Smokeless tobacco: Current User  Substance Use Topics  . Alcohol use: Yes    Comment: on occasion   . Drug use: No   Review of Systems Constitutional: Positive for fever and sweats Eyes: Negative for vision changes ENT:  Negative for congestion, sore throat Cardiovascular: Negative for chest pain. Respiratory: Positive for cough Gastrointestinal: Negative for abdominal pain, vomiting and diarrhea. Musculoskeletal: Negative for back pain. Skin: Negative for rash. Neurological: Positive for weakness and headache  All systems negative/normal/unremarkable except as stated in the HPI  ____________________________________________   PHYSICAL EXAM:  VITAL SIGNS: ED Triage Vitals  Enc Vitals Group     BP 05/19/18 0909 (!) 147/84     Pulse Rate 05/19/18 0909 99     Resp 05/19/18 0909 16     Temp 05/19/18 0909 99 F (37.2 C)     Temp Source 05/19/18 0909 Oral     SpO2 05/19/18 0909 97 %     Weight 05/19/18 0910 223 lb (101.2 kg)     Height 05/19/18 0910 6\' 1"  (1.854 m)     Head Circumference --      Peak Flow --      Pain Score 05/19/18 0909 7     Pain Loc --      Pain Edu? --  Excl. in [place]? --    Constitutional: Alert and oriented. Well appearing and in no distress. Eyes: Conjunctivae are normal. Normal extraocular movements. ENT   Head: Normocephalic and atraumatic.   Nose: No congestion/rhinnorhea.   Mouth/Throat: Mucous membranes are moist.   Neck: No stridor. Cardiovascular: Normal rate, regular rhythm. No murmurs, rubs, or gallops. Respiratory: Rales auscultated on the right side Gastrointestinal: Soft and nontender. Normal bowel sounds Musculoskeletal: Nontender with normal range of motion in extremities. No lower extremity tenderness nor  edema. Neurologic:  Normal speech and language. No gross focal neurologic deficits are appreciated.  Skin:  Skin is warm, dry and intact. No rash noted. Psychiatric: Mood and affect are normal. Speech and behavior are normal.  ____________________________________________  ED COURSE:  As part of my medical decision making, I reviewed the following data within the [place] History obtained from family if available, nursing notes, old chart and ekg, as well as notes from prior ED visits. Patient presented for multiple complaints including fever, cough, headache and dark urine, we will assess with labs and imaging as indicated at this time.   Procedures ____________________________________________   LABS (pertinent positives/negatives)  Labs Reviewed  CBC WITH DIFFERENTIAL/PLATELET - Abnormal; Notable for the following components:      Result Value   RBC 4.14 (*)    HCT 36.9 (*)    Lymphs Abs 0.7 (*)    All other components within normal limits  COMPREHENSIVE METABOLIC PANEL - Abnormal; Notable for the following components:   Sodium 132 (*)    Potassium 3.3 (*)    Glucose, Bld 123 (*)    Calcium 8.8 (*)    AST 121 (*)    ALT 169 (*)    All other components within normal limits  URINALYSIS, COMPLETE (UACMP) WITH MICROSCOPIC - Abnormal; Notable for the following components:   Color, Urine YELLOW (*)    APPearance CLEAR (*)    Hgb urine dipstick MODERATE (*)    Protein, ur 30 (*)    All other components within normal limits  CK - Abnormal; Notable for the following components:   Total CK 899 (*)    All other components within normal limits  CK - Abnormal; Notable for the following components:   Total CK 886 (*)    All other components within normal limits  TROPONIN I    RADIOLOGY Images were viewed by me  Chest x-ray IMPRESSION: Bilateral multifocal pneumonia, most prominent in the superior segment of the right lower  lobe.  ____________________________________________  DIFFERENTIAL DIAGNOSIS   Dehydration, electrolyte abnormality, rhabdomyolysis, pneumonia  FINAL ASSESSMENT AND PLAN  Dehydration, rhabdomyolysis, community-acquired pneumonia   Plan: The patient had presented for headache, fever, dark urine and cough. Patient's labs do reflect some rhabdomyolysis although his CK level is likely peaked and appears to be going down.  He was given 2 L of IV fluids as well as IV Rocephin and azithromycin. Patient's imaging did reveal a multifocal pneumonia which is surprising given the fact he has no fever here or white blood cell count elevation.  Again he did receive IV antibiotics and will be discharged home with same.  I did offer admission but he will prefer to go home.   Laurence Aly, MD   Note: This note was generated in part or whole with voice recognition software. Voice recognition is usually quite accurate but there are transcription errors that can and very often do occur. I apologize for any typographical errors that  were not detected and corrected.     Earleen Newport, MD 05/19/18 1248

## 2018-05-19 NOTE — Progress Notes (Addendum)
Name: Jesus Barber   MRN: [id number]    DOB: September 15, 1966   Date:05/19/2018       Progress Note  Subjective  Chief Complaint  Chief Complaint  Patient presents with  . Cough    fever 102, no appetite   . Hematuria    HPI  Pt presents with concern for cough, fever, and dark urine.  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  5 days ago he started to feel fatigued and noticed dark urine. 4 days ago he had some nausea, 3 days ago fevers, chills, body aches developed, and dry cough began yesterday.  Fevers at home have gotten up to 103.49F.  Also endorses diaphoreses with soaking sweats occurring at night.  He has been drinking plenty of water/gatorade.  Denies any urinary history -- no UTI, nephrolithiasis, or hematuria history.  Endorses bilateral low back pain.  Denies nasal congestion, sore throat, sinus pain/pressure, chest pain, shortness of breath.  No recent tick bites that he is aware of, no recent international travel.  Pt stopped all of his medications on Saturday/Sunday because he was afraid to take them due to concern for symptoms being caused by one of his medications.  Pertinent History: HTN, Renal insufficiency & DM on Metformin - most recent labs on 01/24/2018 are reviewed and kidney function was WNL. A1C was 6.0%  Patient Active Problem List   Diagnosis Date Noted  . Obesity (BMI 30.0-34.9) 01/24/2018  . Lipoma 01/24/2018  . Impaired fasting glucose 01/24/2018  . Tinnitus, bilateral 01/24/2018  . Medication monitoring encounter 02/22/2017  . Hypertriglyceridemia 07/21/2016  . Tear of medial collateral ligament of elbow, right, subsequent encounter 01/25/2016  . Stress 01/13/2016  . Mild alcohol use disorder 12/04/2015  . Low serum testosterone level 08/14/2015  . Dyslipidemia 08/12/2015  . Essential hypertension, benign 08/12/2015  . Renal insufficiency 08/12/2015  . Elevated serum glutamic pyruvic transaminase (SGPT) level 08/12/2015  . Shoulder  pain, right 08/12/2015    Social History   Tobacco Use  . Smoking status: Former Smoker    Years: 25.00    Types: Cigarettes    Last attempt to quit: 11/20/2003    Years since quitting: 14.5  . Smokeless tobacco: Current User  Substance Use Topics  . Alcohol use: Yes    Comment: on occasion      Current Outpatient Medications:  .  aspirin EC 81 MG tablet, Take 81 mg by mouth daily., Disp: , Rfl:  .  atorvastatin (LIPITOR) 40 MG tablet, TAKE 1 TABLET(40 MG) BY MOUTH AT BEDTIME (Patient not taking: Reported on 05/19/2018), Disp: 30 tablet, Rfl: 0 .  escitalopram (LEXAPRO) 20 MG tablet, TAKE 1 TABLET BY MOUTH ONCE DAILY (Patient not taking: Reported on 05/19/2018), Disp: 90 tablet, Rfl: 1 .  fenofibrate (TRICOR) 145 MG tablet, TAKE 1 TABLET(145 MG) BY MOUTH DAILY (Patient not taking: Reported on 05/19/2018), Disp: 30 tablet, Rfl: 0 .  lisinopril (PRINIVIL,ZESTRIL) 10 MG tablet, Take 1 tablet (10 mg total) by mouth daily. Appointment please (Patient not taking: Reported on 05/19/2018), Disp: 90 tablet, Rfl: 0 .  metFORMIN (GLUCOPHAGE-XR) 500 MG 24 hr tablet, Take 2 tablets (1,000 mg total) by mouth daily. (Patient not taking: Reported on 05/19/2018), Disp: 60 tablet, Rfl: 5 .  Omega-3 Fatty Acids (FISH OIL PO), Take 1,000 mg by mouth daily., Disp: , Rfl:   No Known Allergies  ROS  Ten systems reviewed and is negative except as mentioned in HPI.  Objective  Vitals:   05/19/18 0820  BP: 124/76  Pulse: (!) 110  Resp: 18  Temp: 99 F (37.2 C)  TempSrc: Oral  SpO2: 95%  Weight: 223 lb 8 oz (101.4 kg)  Height: 6' (1.829 m)   Body mass index is 30.31 kg/m.  Nursing Note and Vital Signs reviewed.  Physical Exam  Constitutional: Patient appears well-developed and well-nourished. Obese. No distress.  HEENT: head atraumatic, normocephalic, EOM's intact, TM's without erythema or bulging, no maxillary or frontal sinus tenderness, neck supple without lymphadenopathy, oropharynx pink and  moist without exudate. Cardiovascular: Normal rate, regular rhythm, S1/S2 present.  No murmur or rub heard. No BLE edema. Pulmonary/Chest: Effort normal and breath sounds clear. No respiratory distress or retractions.  Dry hacking cough is present throughout examination without production. Abdominal: Soft abdomen with no HSM, mild tenderness in RUQ, bowel sounds present x4 quadrants.  No CVA Tenderness on percussion noted. Psychiatric: Patient has a normal mood and affect. behavior is normal. Judgment and thought content normal. Neuro: A&Ox4, no gross CN deficits.  Results for orders placed or performed in visit on 05/19/18 (from the past 72 hour(s))  POCT urinalysis dipstick     Status: Abnormal   Collection Time: 05/19/18  8:29 AM  Result Value Ref Range   Color, UA gold    Clarity, UA clear    Glucose, UA Negative Negative   Bilirubin, UA negative    Ketones, UA trace    Spec Grav, UA 1.015 1.010 - 1.025   Blood, UA large    pH, UA 6.5 5.0 - 8.0   Protein, UA Positive (A) Negative   Urobilinogen, UA >=8.0 (A) 0.2 or 1.0 E.U./dL   Nitrite, UA negative    Leukocytes, UA Negative Negative   Appearance dark    Odor none     Assessment & Plan  1. Hematuria, unspecified type 2. Fever, unspecified fever cause 3. Proteinuria, unspecified type 4. Cough 5. Abdominal pain, unspecified abdominal location 6. Generalized body aches 7. Dark urine - POCT urinalysis dipstick  - Discussed case with PCP, Dr. Sanda Klein, and she is in agreement that pt should present for emergency care. I advised pt that due to large blood, proteinuria, and his above described symptoms, I am concerned for nephritis and/or rhabdomyolysis.  Advised pt to go to [facility] ER for further evaluation, he is A&Ox4 and appears reasonably stable, he declines EMS transport.  Report is called to [facility] ER.

## 2018-05-19 NOTE — Telephone Encounter (Signed)
Bonnita Nasuti - please call Mr. Seeley and advise on the below from Dr. Sanda Klein:  STOP fenofibrate and atorvastatin as well as his metformin for the time being (he had already stopped these per our visit this morning)  Schedule an appointment for tomorrow 05/20/2018 with Dr. Sanda Klein (preferred), or myself. Thank you!

## 2018-05-19 NOTE — ED Notes (Signed)
ED Provider at bedside. 

## 2018-05-19 NOTE — ED Notes (Signed)
First Nurse Note:  Patient sent to ED from [place] with "possible rhabdomyolysis per report".  Patient alert and oriented, color good, NAD.  Patient states he has dark urine since last Thursday and that he thought he "was dehydrated".  Drives a truck for a living.

## 2018-05-20 NOTE — Telephone Encounter (Signed)
Also called patient back and left message to return our call.  He is needed an appointment ASAP for lab recheck.  No additional phone numbers on file.

## 2018-05-20 NOTE — Telephone Encounter (Signed)
Called patient left message for him to call office to schedule appointment with Raquel Sarna for today 04/20/2018

## 2018-05-20 NOTE — Telephone Encounter (Signed)
Spoke with patient on telephone after calling again. He states he will not come in tomorrow, does not want to repeat labs, and will come in sometime next week if he feels he is not improving.  I discussed the need for repeat labs at length with him, and for close monitoring, and he declines again. Will forward message to PCP Dr. Sanda Klein so that she is aware of his declining of follow up/labs today/tomorrow.

## 2018-05-22 ENCOUNTER — Emergency Department: Payer: BLUE CROSS/BLUE SHIELD

## 2018-05-22 ENCOUNTER — Other Ambulatory Visit: Payer: Self-pay

## 2018-05-22 ENCOUNTER — Emergency Department
Admission: EM | Admit: 2018-05-22 | Discharge: 2018-05-23 | Disposition: A | Discharge status: Home / Self Care | Payer: BLUE CROSS/BLUE SHIELD | Attending: Emergency Medicine | Admitting: Emergency Medicine

## 2018-05-22 DIAGNOSIS — N189 Chronic kidney disease, unspecified: Secondary | ICD-10-CM | POA: Insufficient documentation

## 2018-05-22 DIAGNOSIS — J189 Pneumonia, unspecified organism: Secondary | ICD-10-CM | POA: Diagnosis not present

## 2018-05-22 DIAGNOSIS — Z87891 Personal history of nicotine dependence: Secondary | ICD-10-CM | POA: Diagnosis not present

## 2018-05-22 DIAGNOSIS — Z7984 Long term (current) use of oral hypoglycemic drugs: Secondary | ICD-10-CM | POA: Diagnosis not present

## 2018-05-22 DIAGNOSIS — Z79899 Other long term (current) drug therapy: Secondary | ICD-10-CM | POA: Diagnosis not present

## 2018-05-22 DIAGNOSIS — R2242 Localized swelling, mass and lump, left lower limb: Secondary | ICD-10-CM | POA: Diagnosis not present

## 2018-05-22 DIAGNOSIS — I129 Hypertensive chronic kidney disease with stage 1 through stage 4 chronic kidney disease, or unspecified chronic kidney disease: Secondary | ICD-10-CM | POA: Diagnosis not present

## 2018-05-22 DIAGNOSIS — R21 Rash and other nonspecific skin eruption: Secondary | ICD-10-CM | POA: Diagnosis present

## 2018-05-22 LAB — COMPREHENSIVE METABOLIC PANEL
ALBUMIN: 3.8 g/dL (ref 3.5–5.0)
ALT: 106 U/L — ABNORMAL HIGH (ref 0–44)
AST: 72 U/L — AB (ref 15–41)
Alkaline Phosphatase: 109 U/L (ref 38–126)
Anion gap: 12 (ref 5–15)
BILIRUBIN TOTAL: 0.8 mg/dL (ref 0.3–1.2)
BUN: 16 mg/dL (ref 6–20)
CALCIUM: 10.1 mg/dL (ref 8.9–10.3)
CHLORIDE: 104 mmol/L (ref 98–111)
CO2: 23 mmol/L (ref 22–32)
Creatinine, Ser: 0.98 mg/dL (ref 0.61–1.24)
GFR calc Af Amer: >60 mL/min (ref >60)
GFR calc non Af Amer: >60 mL/min (ref >60)
GLUCOSE: 122 mg/dL — AB (ref 70–99)
POTASSIUM: 3.5 mmol/L (ref 3.5–5.1)
Sodium: 139 mmol/L (ref 135–145)
Total Protein: 8.3 g/dL — ABNORMAL HIGH (ref 6.5–8.1)

## 2018-05-22 LAB — CBC
HCT: 42.3 % (ref 40.0–52.0)
Hemoglobin: 15.1 g/dL (ref 13.0–18.0)
MCH: 31.8 pg (ref 26.0–34.0)
MCHC: 35.7 g/dL (ref 32.0–36.0)
MCV: 89 fL (ref 80.0–100.0)
PLATELETS: 356 10*3/uL (ref 150–440)
RBC: 4.75 MIL/uL (ref 4.40–5.90)
RDW: 12.3 % (ref 11.5–14.5)
WBC: 5.6 10*3/uL (ref 3.8–10.6)

## 2018-05-22 LAB — CK: CK TOTAL: 707 U/L — AB (ref 49–397)

## 2018-05-22 LAB — TROPONIN I

## 2018-05-22 NOTE — ED Triage Notes (Signed)
Pt reports that he was seen here on Monday for rhabdo and pneumonia.  Pt reports that he has had a rash on torso and swelling to the L foot.  Pt states he still feels weak.  Pt is A&Ox4, in NAD.

## 2018-05-22 NOTE — ED Notes (Signed)
Spoke with Dr. Quentin Cornwall for verbal orders.  Per Dr. Quentin Cornwall, put in chest pain protocols for patient and add on CK due to recent rhabdo diagnosis on Monday.  Readback and verified.

## 2018-05-22 NOTE — ED Provider Notes (Signed)
[facility] Emergency Department Provider Note  Time seen: 11:18 PM  I have reviewed the triage vital signs and the nursing notes.   HISTORY  Chief Complaint Rash; Allergic Reaction; and Foot Swelling    HPI Jesus Barber is a 52 y.o. male with a past medical history of hypertension, hyperlipidemia, recently diagnosed pneumonia as well as rhabdomyolysis who presents to the emergency department with continued concerns of decreased appetite intermittent fever.  According to the patient he was seen in the emergency department Monday for cough, congestion fever, ultimately diagnosed with pneumonia and started on antibiotics.  Patient states he has been taking antibiotics but continues to have intermittent fever, continues to feel general malaise and decreased appetite.  He also states today he had swelling in his left foot, he became concerned so he came to the emergency department for evaluation.  Denies any leg pain, states his swelling has since resolved.  States intermittent subjective fevers at home.  Patient was diagnosed with rhabdomyolysis with a creatinine kinase near 900 on Monday.  Patient states he has been drinking fluids, was concerned that his kidney function could be going down.  Denies any chest pain, states continued cough, rare sputum production.   Past Medical History:  Diagnosis Date  . Ankle fracture   . Back pain    Herniated Disk  . Chronic kidney disease   . Elevated liver enzymes   . Fatty liver   . Hyperlipidemia   . Hypertension   . Hypogonadism in male   . IFG (impaired fasting glucose)     Patient Active Problem List   Diagnosis Date Noted  . Obesity (BMI 30.0-34.9) 01/24/2018  . Lipoma 01/24/2018  . Impaired fasting glucose 01/24/2018  . Tinnitus, bilateral 01/24/2018  . Medication monitoring encounter 02/22/2017  . Hypertriglyceridemia 07/21/2016  . Tear of medial collateral ligament of elbow, right, subsequent encounter  01/25/2016  . Stress 01/13/2016  . Mild alcohol use disorder 12/04/2015  . Low serum testosterone level 08/14/2015  . Dyslipidemia 08/12/2015  . Essential hypertension, benign 08/12/2015  . Renal insufficiency 08/12/2015  . Elevated serum glutamic pyruvic transaminase (SGPT) level 08/12/2015  . Shoulder pain, right 08/12/2015    Past Surgical History:  Procedure Laterality Date  . BACK SURGERY     Discectomy    Prior to Admission medications   Medication Sig Start Date End Date Taking? Authorizing Provider  atorvastatin (LIPITOR) 40 MG tablet TAKE 1 TABLET(40 MG) BY MOUTH AT BEDTIME 04/26/18   Lada, Satira Anis, MD  chlorpheniramine-HYDROcodone (TUSSIONEX PENNKINETIC ER) 10-8 MG/5ML SUER Take 5 mLs by mouth 2 (two) times daily. 05/19/18   Earleen Newport, MD  escitalopram (LEXAPRO) 20 MG tablet TAKE 1 TABLET BY MOUTH ONCE DAILY 01/11/18   Arnetha Courser, MD  fenofibrate (TRICOR) 145 MG tablet TAKE 1 TABLET(145 MG) BY MOUTH DAILY 04/26/18   Lada, Satira Anis, MD  levofloxacin (LEVAQUIN) 750 MG tablet Take 1 tablet (750 mg total) by mouth daily. 05/19/18   Earleen Newport, MD  lisinopril (PRINIVIL,ZESTRIL) 10 MG tablet Take 1 tablet (10 mg total) by mouth daily. Appointment please 12/23/17   Arnetha Courser, MD  metFORMIN (GLUCOPHAGE-XR) 500 MG 24 hr tablet Take 2 tablets (1,000 mg total) by mouth daily. 02/26/18   Arnetha Courser, MD    No Known Allergies  Family History  Problem Relation Age of Onset  . Cancer Father        lung  . Alzheimer's disease Father   .  Hyperlipidemia Paternal Grandmother     Social History Social History   Tobacco Use  . Smoking status: Former Smoker    Years: 25.00    Types: Cigarettes    Last attempt to quit: 11/20/2003    Years since quitting: 14.5  . Smokeless tobacco: Current User  Substance Use Topics  . Alcohol use: Yes    Comment: on occasion   . Drug use: No    Review of Systems Constitutional: Intermittent subjective fever at home  positive for generalized malaise. Eyes: Negative for visual complaints ENT: Negative for recent illness/congestion Cardiovascular: Negative for chest pain. Respiratory: Mild shortness of breath, intermittent cough Gastrointestinal: Negative for abdominal pain, vomiting Genitourinary: Negative for urinary compaints Musculoskeletal: Left foot swelling today, now resolved Skin: States slight rash to bilateral armpits. Neurological: Negative for headache All other ROS negative  ____________________________________________   PHYSICAL EXAM:  VITAL SIGNS: ED Triage Vitals  Enc Vitals Group     BP 05/22/18 2120 (!) 105/56     Pulse Rate 05/22/18 2120 (!) 105     Resp 05/22/18 2116 18     Temp 05/22/18 2116 97.9 F (36.6 C)     Temp Source 05/22/18 2116 Oral     SpO2 05/22/18 2120 95 %     Weight 05/22/18 2122 223 lb (101.2 kg)     Height 05/22/18 2122 6\' 1"  (1.854 m)     Head Circumference --      Peak Flow --      Pain Score 05/22/18 2121 0     Pain Loc --      Pain Edu? --      Excl. in [place]? --    Constitutional: Alert and oriented. Well appearing and in no distress. Eyes: Normal exam ENT   Head: Normocephalic and atraumatic   Mouth/Throat: Mucous membranes are moist. Cardiovascular: Normal rate, regular rhythm. No murmur Respiratory: Normal respiratory effort without tachypnea nor retractions. Breath sounds are clear.  Occasional cough during examination. Gastrointestinal: Soft and nontender. No distention. Musculoskeletal: Nontender with normal range of motion in all extremities. No lower extremity tenderness or edema.  2+ DP pulse bilaterally. Neurologic:  Normal speech and language. No gross focal neurologic deficits  Skin:  Skin is warm, dry and intact.  Psychiatric: Mood and affect are normal.   ____________________________________________   EKG reviewed and interpreted by myself shows normal sinus rhythm 87 bpm with a narrow QRS, normal axis, largely normal  intervals, no concerning ST changes.  Occasional PVC.    RADIOLOGY  Chest x-ray shows slight improvement in the appearance of pneumonia  ____________________________________________   INITIAL IMPRESSION / ASSESSMENT AND PLAN / ED COURSE  Pertinent labs & imaging results that were available during my care of the patient were reviewed by me and considered in my medical decision making (see chart for details).  Patient presents to the emergency department for evaluation of generalized malaise continued intermittent fever.  Patient was diagnosed with rhabdomyolysis as well as pneumonia 3 days ago.  Differential would include worsening pneumonia, worsening rhabdomyolysis, renal insufficiency, DVT.  Given the patient's complaint of swelling will obtain a left lower extremely ultrasound as a precaution, no swelling currently, nontender with no erythema, 2+ DP pulse.  Patient's CK is resolving was 899 3 days ago is now 707.  Kidney function continues to appear well.  Overall patient appears well, no acute distress.  LFTs although moderately elevated are resolving from 3 days ago.  Chest x-ray shows improving pneumonia.  Ultrasound negative for DVT.  Overall patient has a reassuring work-up.  We will discharge the patient home with PCP follow-up.    ____________________________________________   FINAL CLINICAL IMPRESSION(S) / ED DIAGNOSES  Pneumonia    Harvest Dark, MD 05/23/18 0040

## 2018-05-23 ENCOUNTER — Encounter: Payer: Self-pay | Admitting: Emergency Medicine

## 2018-05-23 ENCOUNTER — Encounter: Payer: Self-pay | Admitting: Family Medicine

## 2018-05-23 ENCOUNTER — Ambulatory Visit (INDEPENDENT_AMBULATORY_CARE_PROVIDER_SITE_OTHER): Payer: BLUE CROSS/BLUE SHIELD | Admitting: Family Medicine

## 2018-05-23 VITALS — BP 124/68 | HR 97 | Temp 97.7°F | Resp 18 | Ht 72.0 in | Wt 216.4 lb

## 2018-05-23 DIAGNOSIS — I10 Essential (primary) hypertension: Secondary | ICD-10-CM

## 2018-05-23 DIAGNOSIS — L509 Urticaria, unspecified: Secondary | ICD-10-CM

## 2018-05-23 DIAGNOSIS — J189 Pneumonia, unspecified organism: Secondary | ICD-10-CM | POA: Diagnosis not present

## 2018-05-23 DIAGNOSIS — T783XXA Angioneurotic edema, initial encounter: Secondary | ICD-10-CM | POA: Diagnosis not present

## 2018-05-23 MED ORDER — LOSARTAN POTASSIUM 25 MG PO TABS: 25.0000 mg | ORAL_TABLET | Freq: Every day | ORAL | 0 refills | Status: DC

## 2018-05-23 MED ORDER — RANITIDINE HCL 150 MG PO TABS: 150.0000 mg | ORAL_TABLET | Freq: Two times a day (BID) | ORAL | 0 refills | Status: DC

## 2018-05-23 MED ORDER — DOXYCYCLINE HYCLATE 100 MG PO TABS: 100.0000 mg | ORAL_TABLET | Freq: Two times a day (BID) | ORAL | 0 refills | Status: AC

## 2018-05-23 MED ORDER — DIPHENHYDRAMINE HCL 25 MG PO TABS: 25.0000 mg | ORAL_TABLET | Freq: Three times a day (TID) | ORAL | 0 refills | Status: DC | PRN

## 2018-05-23 NOTE — Progress Notes (Signed)
Name: Jesus Barber   MRN: [id number]    DOB: 06-May-1966   Date:05/23/2018       Progress Note  Subjective  Chief Complaint  Chief Complaint  Patient presents with  . Follow-up    ER for Pneumonia  . Angioedema    seen in ER last night left side of lip and jaw swollen after leaving ER. Was seen due to foot swelling.  . Rash    HPI  Pt was seen 05/19/2018 in our office and in the ER for non-traumatic Rhabdo and penumonia. Was started on Levaquin and Tussionex which he has been taking (today is his last dose, but he held this dose to come and see [place] today) - Rash developed 05/20/2018 mid-morning under both axilla extending down to the hip  - LEFT foot swelling - started 05/22/18 in around 4pm, and completely resolved by midnight (Was seen in the ER for this).  Denies pain or itching. - LEFT lip and left cheek started to tingle around 1:45 this morning upon discharge from the ER; he then awoke this morning at 7:30am and his left lower lip and cheek were very swollen, so he came in to be seen.  Denies shortness of breath, trouble swallowing, coughing, throat swelling/itching. Pt has HTN and has been taking 34m Lisinopril for many years.   Review of 05/22/18 ER Visit:  LLE [place] Negative for DVT, normal compressibility throughout CXR: Slight Improvement of pneumonia in bilateral lungs CBC: WNL, Total CK shows improvement at 707, Troponin negative, CMP shows improving liver enzmes, kidney function normal.  Patient Active Problem List   Diagnosis Date Noted  . Obesity (BMI 30.0-34.9) 01/24/2018  . Lipoma 01/24/2018  . Impaired fasting glucose 01/24/2018  . Tinnitus, bilateral 01/24/2018  . Medication monitoring encounter 02/22/2017  . Hypertriglyceridemia 07/21/2016  . Tear of medial collateral ligament of elbow, right, subsequent encounter 01/25/2016  . Stress 01/13/2016  . Mild alcohol use disorder 12/04/2015  . Low serum testosterone level 08/14/2015  . Dyslipidemia 08/12/2015  .  Essential hypertension, benign 08/12/2015  . Renal insufficiency 08/12/2015  . Elevated serum glutamic pyruvic transaminase (SGPT) level 08/12/2015  . Shoulder pain, right 08/12/2015    Social History   Tobacco Use  . Smoking status: Former Smoker    Years: 25.00    Types: Cigarettes    Last attempt to quit: 11/20/2003    Years since quitting: 14.5  . Smokeless tobacco: Current User  Substance Use Topics  . Alcohol use: Yes    Comment: on occasion      Current Outpatient Medications:  .  atorvastatin (LIPITOR) 40 MG tablet, TAKE 1 TABLET(40 MG) BY MOUTH AT BEDTIME, Disp: 30 tablet, Rfl: 0 .  chlorpheniramine-HYDROcodone (TUSSIONEX PENNKINETIC ER) 10-8 MG/5ML SUER, Take 5 mLs by mouth 2 (two) times daily., Disp: 140 mL, Rfl: 0 .  escitalopram (LEXAPRO) 20 MG tablet, TAKE 1 TABLET BY MOUTH ONCE DAILY, Disp: 90 tablet, Rfl: 1 .  fenofibrate (TRICOR) 145 MG tablet, TAKE 1 TABLET(145 MG) BY MOUTH DAILY, Disp: 30 tablet, Rfl: 0 .  levofloxacin (LEVAQUIN) 750 MG tablet, Take 1 tablet (750 mg total) by mouth daily., Disp: 5 tablet, Rfl: 0 .  lisinopril (PRINIVIL,ZESTRIL) 10 MG tablet, Take 1 tablet (10 mg total) by mouth daily. Appointment please, Disp: 90 tablet, Rfl: 0 .  metFORMIN (GLUCOPHAGE-XR) 500 MG 24 hr tablet, Take 2 tablets (1,000 mg total) by mouth daily., Disp: 60 tablet, Rfl: 5  No Known Allergies  ROS Constitutional: Negative  for fever or weight change.  Respiratory: Positive for minimal (improving) cough (occaisonally has small amount of blood in his phlegm, is recovering from PNA) and shortness of breath.   Cardiovascular: Negative for chest pain or palpitations.  Gastrointestinal: Negative for abdominal pain, no bowel changes.  Musculoskeletal: Negative for gait problem or joint swelling.  Skin: Positive for rash and oral swelling. Neurological: Negative for dizziness or headache.  No other specific complaints in a complete review of systems (except as listed in HPI  above).  Objective  Vitals:   05/23/18 0836  BP: 124/68  Pulse: 97  Resp: 18  Temp: 97.7 F (36.5 C)  TempSrc: Oral  SpO2: 95%  Weight: 216 lb 6.4 oz (98.2 kg)  Height: 6' (1.829 m)   Body mass index is 29.35 kg/m.  Nursing Note and Vital Signs reviewed.  Physical Exam  Constitutional: Patient appears well-developed and well-nourished.  No distress.  HEENT: head atraumatic, normocephalic, neck supple without lymphadenopathy, oropharynx pink and moist without exudate, and without swelling.  LEFT lower lip is swollen and left cheek appears slightly swollen.  Cardiovascular: Normal rate, regular rhythm, S1/S2 present.  No murmur or rub heard. No BLE edema. Pulmonary/Chest: Effort normal and breath sounds clear. No respiratory distress or retractions. Psychiatric: Patient has a normal mood and affect. behavior is normal. Judgment and thought content normal. Skin: Urticaric rash under bilateral axilla extending to the hip.  Results for orders placed or performed during the hospital encounter of 05/22/18 (from the past 72 hour(s))  CK     Status: Abnormal   Collection Time: 05/22/18  9:44 PM  Result Value Ref Range   Total CK 707 (H) 49 - 397 U/L    Comment: Performed at [facility], [address] [place], [place] [address]  Troponin I     Status: None   Collection Time: 05/22/18  9:44 PM  Result Value Ref Range   Troponin I <0.03 <0.03 ng/mL    Comment: Performed at [facility], [address] [place], [place] [address]  Comprehensive metabolic panel     Status: Abnormal   Collection Time: 05/22/18  9:44 PM  Result Value Ref Range   Sodium 139 135 - 145 mmol/L   Potassium 3.5 3.5 - 5.1 mmol/L   Chloride 104 98 - 111 mmol/L    Comment: Please note change in reference range.   CO2 23 22 - 32 mmol/L   Glucose, Bld 122 (H) 70 - 99 mg/dL    Comment: Please note change in reference range.   BUN 16 6 - 20 mg/dL    Comment: Please note change in  reference range.   Creatinine, Ser 0.98 0.61 - 1.24 mg/dL   Calcium 10.1 8.9 - 10.3 mg/dL   Total Protein 8.3 (H) 6.5 - 8.1 g/dL   Albumin 3.8 3.5 - 5.0 g/dL   AST 72 (H) 15 - 41 U/L   ALT 106 (H) 0 - 44 U/L    Comment: Please note change in reference range.   Alkaline Phosphatase 109 38 - 126 U/L   Total Bilirubin 0.8 0.3 - 1.2 mg/dL   GFR calc non Af Amer >60 >60 mL/min   GFR calc Af Amer >60 >60 mL/min    Comment: (NOTE) The eGFR has been calculated using the CKD EPI equation. This calculation has not been validated in all clinical situations. eGFR's persistently <60 mL/min signify possible Chronic Kidney Disease.    Anion gap 12 5 - 15  Comment: Performed at [facility], [address] [place], [place] [address]  CBC     Status: None   Collection Time: 05/22/18  9:44 PM  Result Value Ref Range   WBC 5.6 3.8 - 10.6 K/uL   RBC 4.75 4.40 - 5.90 MIL/uL   Hemoglobin 15.1 13.0 - 18.0 g/dL   HCT 42.3 40.0 - 52.0 %   MCV 89.0 80.0 - 100.0 fL   MCH 31.8 26.0 - 34.0 pg   MCHC 35.7 32.0 - 36.0 g/dL   RDW 12.3 11.5 - 14.5 %   Platelets 356 150 - 440 K/uL    Comment: Performed at  [facility], [address] [place], [place] [address]    Assessment & Plan  1. Angioedema, initial encounter - Red flags/return precautions/when to present to the ER discussed in detail - ranitidine (ZANTAC) 150 MG tablet; Take 1 tablet (150 mg total) by mouth 2 (two) times daily.  Dispense: 20 tablet; Refill: 0 - diphenhydrAMINE (BENADRYL ALLERGY) 25 MG tablet; Take 1 tablet (25 mg total) by mouth every 8 (eight) hours as needed (Until rash and lip swelling improve).  Dispense: 30 tablet; Refill: 0 - Advised to take Zantac and benadryl around the clock until swelling improves, then PRN.  2. Urticaria - ranitidine (ZANTAC) 150 MG tablet; Take 1 tablet (150 mg total) by mouth 2 (two) times daily.  Dispense: 20 tablet; Refill: 0 - diphenhydrAMINE (BENADRYL ALLERGY) 25 MG tablet;  Take 1 tablet (25 mg total) by mouth every 8 (eight) hours as needed (Until rash and lip swelling improve).  Dispense: 30 tablet; Refill: 0  3. Essential hypertension, benign - losartan (COZAAR) 25 MG tablet; Take 1 tablet (25 mg total) by mouth daily.  Dispense: 90 tablet; Refill: 0 - STOP lisinopril as it is unclear what has caused the angioedema.  4. Community acquired pneumonia, unspecified laterality - STOP levaquin as it is unclear what has caused the angioedema - doxycycline (VIBRA-TABS) 100 MG tablet; Take 1 tablet (100 mg total) by mouth 2 (two) times daily for 7 days.  Dispense: 14 tablet; Refill: 0  -Red flags and when to present for emergency care or RTC including fever >101.71F, chest pain, shortness of breath, new/worsening/un-resolving symptoms, throat pain/swelling/itching, difficulting talking/swallowing/controlling saliva reviewed with patient at time of visit. Follow up and care instructions discussed and provided in AVS. - Return in about 4 days (around 05/27/2018) for Follow Up with Dr. Sanda Klein or Raquel Sarna.

## 2018-05-23 NOTE — Patient Instructions (Addendum)
STOP Lisinopril - Throw it away. STOP Levofloxacin - Throw it away  START Doxycyline (Antibiotic - for Pneumonia) - take twice daily for 7 days START  Benadryl and Zantac as prescribed until your lip swelling and rash improves. START Losartan - take once daily for your blood pressure.

## 2018-05-27 ENCOUNTER — Encounter: Payer: Self-pay | Admitting: Family Medicine

## 2018-05-27 ENCOUNTER — Ambulatory Visit (INDEPENDENT_AMBULATORY_CARE_PROVIDER_SITE_OTHER): Payer: BLUE CROSS/BLUE SHIELD | Admitting: Family Medicine

## 2018-05-27 ENCOUNTER — Encounter: Payer: Self-pay | Admitting: Emergency Medicine

## 2018-05-27 VITALS — BP 118/68 | HR 85 | Temp 97.9°F | Resp 18 | Ht 72.0 in | Wt 213.6 lb

## 2018-05-27 DIAGNOSIS — L509 Urticaria, unspecified: Secondary | ICD-10-CM

## 2018-05-27 DIAGNOSIS — E785 Hyperlipidemia, unspecified: Secondary | ICD-10-CM

## 2018-05-27 DIAGNOSIS — M6282 Rhabdomyolysis: Secondary | ICD-10-CM | POA: Diagnosis not present

## 2018-05-27 DIAGNOSIS — J189 Pneumonia, unspecified organism: Secondary | ICD-10-CM | POA: Diagnosis not present

## 2018-05-27 DIAGNOSIS — I10 Essential (primary) hypertension: Secondary | ICD-10-CM

## 2018-05-27 DIAGNOSIS — T783XXD Angioneurotic edema, subsequent encounter: Secondary | ICD-10-CM | POA: Diagnosis not present

## 2018-05-27 MED ORDER — ICOSAPENT ETHYL 1 G PO CAPS: 2.0000 g | ORAL_CAPSULE | Freq: Two times a day (BID) | ORAL | 0 refills | Status: DC

## 2018-05-27 NOTE — Progress Notes (Signed)
Name: Jesus Barber   MRN: [id number]    DOB: 1966-05-06   Date:05/27/2018       Progress Note  Subjective  Chief Complaint  Chief Complaint  Patient presents with  . Follow-up    HPI  Pt presents to follow up on non-traumatic rhabdomyolosis, pneumonia, and allergic reaction.    Non-traumatic Rhabdomyolosis: Denies muscle aches today - states his low back was causing him the most pain, but this is not bothering him today.  He has taken a few days of his statin and fenofibrate - advised he stop these immediately as this may have contributed to his Rhabdo diagnosis, and he had been advised to stop these last week during both visits and in the ER. Appetite is back, has more energy. We will recheck CMP and CK today.  Encouraged continued good hydration.  PNA: Still had a little shortness of breath while outside walking yesterday, but is chest pain free and feeling much improved overall.  Still finishing Doxycyline.  No fevers, chills, or swelling of any kind. We will recheck CBC today; future order for Chest Xray placed for just prior to his 3 week follow up.  HTN: We switched medications at last visit due to angioedema of uncertain cause. Angioedema has completely resolved. He is feeling well on losartan (was taken off of lisinopril).  BP is at goal 118/68.  Denies vision changes, headaches, BLE edema, or chest pain.  Allergic Reaction: Angioedema has completely resolved; rash is significantly improved. Denies itching, difficulty breathing, throat tightness/itching, or oral swelling.    Hyperlipidemia: We will continue to pause atorvastatin (stopped after Rhabdo diagnosis, but patient forgot and took a few days of it over the weekend) until CK is back to normal level; we will completely STOP fenofibrate as discussed at previous visit (again, this was stopped after Rhabdo diagnosis, but pt forgot and took a few days of it over the weekend).  We will try Vascepa 2g BID to replace the  fenofibrate.   Patient Active Problem List   Diagnosis Date Noted  . Obesity (BMI 30.0-34.9) 01/24/2018  . Lipoma 01/24/2018  . Impaired fasting glucose 01/24/2018  . Tinnitus, bilateral 01/24/2018  . Medication monitoring encounter 02/22/2017  . Hypertriglyceridemia 07/21/2016  . Tear of medial collateral ligament of elbow, right, subsequent encounter 01/25/2016  . Stress 01/13/2016  . Mild alcohol use disorder 12/04/2015  . Low serum testosterone level 08/14/2015  . Dyslipidemia 08/12/2015  . Essential hypertension, benign 08/12/2015  . Renal insufficiency 08/12/2015  . Elevated serum glutamic pyruvic transaminase (SGPT) level 08/12/2015  . Shoulder pain, right 08/12/2015    Social History   Tobacco Use  . Smoking status: Former Smoker    Years: 25.00    Types: Cigarettes    Last attempt to quit: 11/20/2003    Years since quitting: 14.5  . Smokeless tobacco: Current User  Substance Use Topics  . Alcohol use: Yes    Comment: on occasion      Current Outpatient Medications:  .  diphenhydrAMINE (BENADRYL ALLERGY) 25 MG tablet, Take 1 tablet (25 mg total) by mouth every 8 (eight) hours as needed (Until rash and lip swelling improve)., Disp: 30 tablet, Rfl: 0 .  doxycycline (VIBRA-TABS) 100 MG tablet, Take 1 tablet (100 mg total) by mouth 2 (two) times daily for 7 days., Disp: 14 tablet, Rfl: 0 .  escitalopram (LEXAPRO) 20 MG tablet, TAKE 1 TABLET BY MOUTH ONCE DAILY, Disp: 90 tablet, Rfl: 1 .  losartan (COZAAR) 25  MG tablet, Take 1 tablet (25 mg total) by mouth daily., Disp: 90 tablet, Rfl: 0 .  metFORMIN (GLUCOPHAGE-XR) 500 MG 24 hr tablet, Take 2 tablets (1,000 mg total) by mouth daily., Disp: 60 tablet, Rfl: 5 .  ranitidine (ZANTAC) 150 MG tablet, Take 1 tablet (150 mg total) by mouth 2 (two) times daily., Disp: 20 tablet, Rfl: 0 .  chlorpheniramine-HYDROcodone (TUSSIONEX PENNKINETIC ER) 10-8 MG/5ML SUER, Take 5 mLs by mouth 2 (two) times daily. (Patient not taking: Reported  on 05/27/2018), Disp: 140 mL, Rfl: 0  Allergies  Allergen Reactions  . Ace Inhibitors Swelling    Angioedema and rash 05/23/2018  . Levaquin [Levofloxacin In D5w] Swelling    Angioedema and Rash 05/23/2018    ROS  Constitutional: Negative for fever; positive for weight loss.  Respiratory: Positive for ongoing dry cough and mild shortness of breath (improving course) Cardiovascular: Negative for chest pain or palpitations.  Gastrointestinal: Negative for abdominal pain, no bowel changes.  Musculoskeletal: Negative for gait problem or joint swelling.  Skin: Negative for rash.  Neurological: Negative for dizziness or headache.  No other specific complaints in a complete review of systems (except as listed in HPI above).  Objective  Vitals:   05/27/18 0750  BP: 118/68  Pulse: 85  Resp: 18  Temp: 97.9 F (36.6 C)  TempSrc: Oral  SpO2: 95%  Weight: 213 lb 9.6 oz (96.9 kg)  Height: 6' (1.829 m)   Body mass index is 28.97 kg/m.  Nursing Note and Vital Signs reviewed.  Physical Exam  Constitutional: Patient appears well-developed and well-nourished. No distress.  HEENT: head atraumatic, normocephalic, pupils equal and reactive to light. Angioedema has completely resolved. Cardiovascular: Normal rate, regular rhythm, S1/S2 present.  No murmur or rub heard. No BLE edema. Pulmonary/Chest: Effort normal and breath sounds clear with no wheezing, rhonchi, or crackles. No respiratory distress or retractions. Psychiatric: Patient has a normal mood and affect. behavior is normal. Judgment and thought content normal. Skin: Urticaric rash is still present in  2 small patches just below bilateral axilla, but appears much improved with only minimal erythema.   No results found for this or any previous visit (from the past 72 hour(s)).  Assessment & Plan  1. Non-traumatic rhabdomyolysis - STOP Fenofibrate; pause Atorvastatin and await further instruction at next follow up.  Continue  Metformin at current dose. - Continue to push plenty of fluids. - Clinical course appears to be improving, will monitor CMP and total CK today.  - COMPLETE METABOLIC PANEL WITH GFR - CK Total (and CKMB)  2. Community acquired pneumonia, unspecified laterality - Clinical course appears to be improving - lung sounds are clear and patient is feeling better.  He does still note a dry cough and some shortness of breath with exertion yesterday.  I recommend he continue to rest and hydrate, but to try to slowly and carefully increase active each day to begin reconditioning.  - CBC - DG Chest 2 View; Future - Advised to go on Monday 06/09/2018 (3 weeks after initial diagnosis) for follow up chest Xray and schedule a follow up with PCP Dr. Sanda Klein or myself for later that week.  3. Angioedema, subsequent encounter Resolved.  Allergens noted in chart as ACE-I and Levaquin as it is unclear which was the cause.  4. Urticaria Significant improvement noted.  Allergens noted in chart as ACE-I and Levaquin as it is unclear which was the cause.  5. Essential hypertension, benign - COMPLETE METABOLIC PANEL WITH GFR -  BP is at goal today, recommend continuing Losartan, and monitor for any adverse signs and symptoms as diastolic was on lower end of normal at 68.  6. Dyslipidemia - STOP Fenofibrate; pause Atorvastatin and await further instruction at next follow up (we will plan to restart after CK has returned to baseline).  - Icosapent Ethyl (VASCEPA) 1 g CAPS; Take 2 capsules (2 g total) by mouth 2 (two) times daily.  Dispense: 120 capsule; Refill: 0 - Savings card for Vascepa provided today.  -Red flags and when to present for emergency care or RTC including fever >101.70F, chest pain, shortness of breath, new/worsening/un-resolving symptoms, muscle aches, reviewed with patient at time of visit. Follow up and care instructions discussed and provided in AVS.  - Work note provided stating out of work until  cleared by PCP office (we will await lab results for CK level prior to clearing for work).  Advised that he has had two significant concurrent illnesses that may take some time for him to recover. I advised that if there is any paperwork to be completed for his time away from work, I am happy to complete this for him.

## 2018-05-27 NOTE — Patient Instructions (Signed)
Please get a follow up Chest Xray on June 09, 2018, and schedule a follow up with Dr. Sanda Klein later that week.    STOP your Fenofibrate and Atorvastatin. START Taking Vascepa twice a day. Continue your Metformin and Losartan and other medications.

## 2018-05-28 LAB — CBC
HEMATOCRIT: 46.7 % (ref 38.5–50.0)
Hemoglobin: 15.4 g/dL (ref 13.2–17.1)
MCH: 29.8 pg (ref 27.0–33.0)
MCHC: 33 g/dL (ref 32.0–36.0)
MCV: 90.3 fL (ref 80.0–100.0)
MPV: 9.4 fL (ref 7.5–12.5)
Platelets: 403 10*3/uL — ABNORMAL HIGH (ref 140–400)
RBC: 5.17 10*6/uL (ref 4.20–5.80)
RDW: 11.5 % (ref 11.0–15.0)
WBC: 7.2 10*3/uL (ref 3.8–10.8)

## 2018-05-28 LAB — COMPLETE METABOLIC PANEL WITH GFR
AG Ratio: 1.8 (calc) (ref 1.0–2.5)
ALBUMIN MSPROF: 4.7 g/dL (ref 3.6–5.1)
ALKALINE PHOSPHATASE (APISO): 80 U/L (ref 40–115)
ALT: 47 U/L — ABNORMAL HIGH (ref 9–46)
AST: 26 U/L (ref 10–35)
BILIRUBIN TOTAL: 0.5 mg/dL (ref 0.2–1.2)
BUN: 22 mg/dL (ref 7–25)
CHLORIDE: 101 mmol/L (ref 98–110)
CO2: 26 mmol/L (ref 20–32)
CREATININE: 1.11 mg/dL (ref 0.70–1.33)
Calcium: 10.2 mg/dL (ref 8.6–10.3)
GFR, EST AFRICAN AMERICAN: 89 mL/min/{1.73_m2} (ref >=60)
GFR, Est Non African American: 77 mL/min/{1.73_m2} (ref >=60)
GLUCOSE: 94 mg/dL (ref 65–99)
Globulin: 2.6 g/dL (calc) (ref 1.9–3.7)
Potassium: 4.4 mmol/L (ref 3.5–5.3)
Sodium: 137 mmol/L (ref 135–146)
TOTAL PROTEIN: 7.3 g/dL (ref 6.1–8.1)

## 2018-05-28 LAB — CK TOTAL AND CKMB (NOT AT ARMC)
CK, MB: 5.5 ng/mL — AB (ref 0–5.0)
Relative Index: 2.9 (ref 0–4.0)
Total CK: 191 U/L (ref 44–196)

## 2018-05-29 ENCOUNTER — Telehealth: Payer: Self-pay | Admitting: Family Medicine

## 2018-05-29 DIAGNOSIS — E781 Pure hyperglyceridemia: Secondary | ICD-10-CM

## 2018-05-29 DIAGNOSIS — M6282 Rhabdomyolysis: Secondary | ICD-10-CM

## 2018-05-29 NOTE — Telephone Encounter (Signed)
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

## 2018-05-29 NOTE — Telephone Encounter (Signed)
Juliann Pulse with [employer] called with critical result of CKMB of 5.5 from 05/27/18. . Dr. Sanda Klein notified.

## 2018-06-05 ENCOUNTER — Telehealth: Payer: Self-pay | Admitting: Family Medicine

## 2018-06-05 NOTE — Telephone Encounter (Signed)
Paperwork faxed and scanned. Copy mailed to patient

## 2018-06-05 NOTE — Telephone Encounter (Signed)
Spoke with patient directly. He is feeling much better, has been trying to be outside and walking here and there to help his recovery.  He agrees to return to work on 06/09/2018, follow up with PCP Dr. Sanda Klein 06/13/2018, and will have his repeat CXR on 06/09/2018.  Paperwork to be faxed.  Advised that if he develops any concerning symptoms upon his return to work that he needs to return for re-evaluation.

## 2018-06-05 NOTE — Telephone Encounter (Signed)
Called patient to check in to see how he is feeling and to discuss FMLA paperwork.  Current plan is to have him return to work on 06/09/2018, have him attend his follow up with Dr. Sanda Klein on 06/13/2018 to determine how he is feeling after going back to work.  I recommend he have his repeat chest Xray done at the hospital on 7/20 or 7/21 or at the [facility] on 06/09/18 depending on his availability.  If he is ok with this plan, please send a message back to me confirming so that I may complete his FMLA paperwork.

## 2018-06-06 ENCOUNTER — Ambulatory Visit
Admission: RE | Admit: 2018-06-06 | Discharge: 2018-06-06 | Disposition: A | Discharge status: Home / Self Care | Payer: BLUE CROSS/BLUE SHIELD | Source: Ambulatory Visit | Attending: Family Medicine | Admitting: Family Medicine

## 2018-06-06 DIAGNOSIS — J189 Pneumonia, unspecified organism: Secondary | ICD-10-CM | POA: Insufficient documentation

## 2018-06-09 ENCOUNTER — Telehealth: Payer: Self-pay | Admitting: Family Medicine

## 2018-06-09 NOTE — Telephone Encounter (Signed)
Pt given results per notes of Jesus Ensign FNP on 06/09/18. Unable to document in result note due to result note not being routed to [facility].  Pt not returning back to work this week. Appt given for 06/10/18.

## 2018-06-09 NOTE — Telephone Encounter (Signed)
Copied from [place] [id number]. Topic: Quick Communication - Office Called Patient >> Jun 09, 2018 11:02 AM Hollie Salk, RMA wrote: P[facility], please give patient CXR results. >> Jun 09, 2018 11:02 AM Hollie Salk, RMA wrote: Please release patient cxr results to him

## 2018-06-10 ENCOUNTER — Ambulatory Visit (INDEPENDENT_AMBULATORY_CARE_PROVIDER_SITE_OTHER): Payer: BLUE CROSS/BLUE SHIELD | Admitting: Family Medicine

## 2018-06-10 ENCOUNTER — Encounter: Payer: Self-pay | Admitting: Emergency Medicine

## 2018-06-10 ENCOUNTER — Encounter: Payer: Self-pay | Admitting: Family Medicine

## 2018-06-10 VITALS — BP 124/82 | HR 92 | Temp 98.4°F | Resp 16 | Ht 72.0 in | Wt 216.9 lb

## 2018-06-10 DIAGNOSIS — E781 Pure hyperglyceridemia: Secondary | ICD-10-CM | POA: Diagnosis not present

## 2018-06-10 DIAGNOSIS — J189 Pneumonia, unspecified organism: Secondary | ICD-10-CM | POA: Diagnosis not present

## 2018-06-10 DIAGNOSIS — M6282 Rhabdomyolysis: Secondary | ICD-10-CM | POA: Diagnosis not present

## 2018-06-10 NOTE — Progress Notes (Signed)
Name: Jesus Barber   MRN: [id number]    DOB: 1966-05-23   Date:06/10/2018       Progress Note  Subjective  Chief Complaint  Chief Complaint  Patient presents with  . Follow-up    HPI  Jesus Barber presents to follow up and for [facility] paperwork. He was diagnosed with CAP and Non-traumatic Rhabdo on 05/19/2018. He has been trying to get out more and more to walk and acclimate to being active again in the heat.  The last week or so our area has been under heat advisory, so this has been more difficult than usual to do.  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 He is outside in very hot temperatures for a lot of the time, driving for many hours, and not getting much sleep during these trips that take about 3 days. His company nurse voiced concern that the heat and physicality of the job would be too much for him to handle this week and that he may need an additional week of leave.  Though he is feeling better today, he voices the same concerns.  Rhabdo: Denies muscle aches or chest pain. He has been staying well hydrated. He is taking vascepa and no longer taking fenofibrate.  He has paused his atorvastatin for the time being. We will recheck CK levels today as it was high end of normal 2 weeks ago; will also recheck CMP today as ALT was still slightly elevated 2 weeks ago.  PNA: CXR shows resolution of PNA; deep breaths still feel a little tight.  He is still coughing but no phlegm or hemoptysis; cough occurs mostly in the mornings.  HLD: He sees endocrinology on September 9; he is also on the waiting list for a sooner appointment.  He started vascepa and is doing well. No longer taking fenofibrate; has not been taking atorvastatin until cleared to do so by PCP.  Patient Active Problem List   Diagnosis Date Noted  . Non-traumatic rhabdomyolysis 05/27/2018  . Obesity (BMI 30.0-34.9) 01/24/2018  . Lipoma 01/24/2018  .  Impaired fasting glucose 01/24/2018  . Tinnitus, bilateral 01/24/2018  . Medication monitoring encounter 02/22/2017  . Hypertriglyceridemia 07/21/2016  . Tear of medial collateral ligament of elbow, right, subsequent encounter 01/25/2016  . Stress 01/13/2016  . Mild alcohol use disorder 12/04/2015  . Low serum testosterone level 08/14/2015  . Dyslipidemia 08/12/2015  . Essential hypertension, benign 08/12/2015  . Renal insufficiency 08/12/2015  . Elevated serum glutamic pyruvic transaminase (SGPT) level 08/12/2015  . Shoulder pain, right 08/12/2015    Social History   Tobacco Use  . Smoking status: Former Smoker    Years: 25.00    Types: Cigarettes    Last attempt to quit: 11/20/2003    Years since quitting: 14.5  . Smokeless tobacco: Current User  Substance Use Topics  . Alcohol use: Yes    Comment: on occasion      Current Outpatient Medications:  .  diphenhydrAMINE (BENADRYL ALLERGY) 25 MG tablet, Take 1 tablet (25 mg total) by mouth every 8 (eight) hours as needed (Until rash and lip swelling improve)., Disp: 30 tablet, Rfl: 0 .  escitalopram (LEXAPRO) 20 MG tablet, TAKE 1 TABLET BY MOUTH ONCE DAILY, Disp: 90 tablet, Rfl: 1 .  Icosapent Ethyl (VASCEPA) 1 g CAPS, Take 2 capsules (2 g total) by mouth 2 (two) times daily., Disp: 120 capsule, Rfl: 0 .  losartan (COZAAR)  25 MG tablet, Take 1 tablet (25 mg total) by mouth daily., Disp: 90 tablet, Rfl: 0 .  metFORMIN (GLUCOPHAGE-XR) 500 MG 24 hr tablet, Take 2 tablets (1,000 mg total) by mouth daily., Disp: 60 tablet, Rfl: 5 .  chlorpheniramine-HYDROcodone (TUSSIONEX PENNKINETIC ER) 10-8 MG/5ML SUER, Take 5 mLs by mouth 2 (two) times daily. (Patient not taking: Reported on 05/27/2018), Disp: 140 mL, Rfl: 0 .  ranitidine (ZANTAC) 150 MG tablet, Take 1 tablet (150 mg total) by mouth 2 (two) times daily. (Patient not taking: Reported on 06/10/2018), Disp: 20 tablet, Rfl: 0  Allergies  Allergen Reactions  . Ace Inhibitors Swelling     Angioedema and rash 05/23/2018  . Levaquin [Levofloxacin In D5w] Swelling    Angioedema and Rash 05/23/2018    ROS  Ten systems reviewed and is negative except as mentioned in HPI  Objective  Vitals:   06/10/18 0843  BP: 124/82  Pulse: 92  Resp: 16  Temp: 98.4 F (36.9 C)  TempSrc: Oral  SpO2: 94%  Weight: 216 lb 14.4 oz (98.4 kg)  Height: 6' (1.829 m)   Body mass index is 29.42 kg/m.  Nursing Note and Vital Signs reviewed.  Physical Exam  Constitutional: He is oriented to person, place, and time. He appears well-developed and well-nourished. No distress.  HENT:  Head: Normocephalic and atraumatic.  Right Ear: External ear normal.  Left Ear: External ear normal.  Nose: Nose normal.  Mouth/Throat: Oropharynx is clear and moist. No oropharyngeal exudate.  Eyes: Pupils are equal, round, and reactive to light. Conjunctivae and EOM are normal.  Neck: Normal range of motion. Neck supple. No JVD present.  Cardiovascular: Normal rate, regular rhythm and normal heart sounds.  Pulmonary/Chest: Effort normal and breath sounds normal. He has no wheezes. He has no rales.  Dry cough is present during exam.  Musculoskeletal: Normal range of motion. He exhibits no edema or tenderness.  Lymphadenopathy:    He has no cervical adenopathy.  Neurological: He is alert and oriented to person, place, and time. No cranial nerve deficit.  Skin: Skin is warm and dry. No rash noted.  Psychiatric: He has a normal mood and affect. His behavior is normal. Judgment and thought content normal.  Nursing note and vitals reviewed.  No results found for this or any previous visit (from the past 72 hour(s)).  Assessment & Plan  1. Non-traumatic rhabdomyolysis - CK (Creatine Kinase) - COMPLETE METABOLIC PANEL WITH GFR  2. Community acquired pneumonia, unspecified laterality - CXR shows resolution; residual cough and some discomfort with deep inspiration present.   3. Hypertriglyceridemia - Continue  Vascepa; we will recheck liver function and CK today; if continuing to improve will consider restarting atorvastatin  - Based on patient's recent serious medical conditions described above and the physically demanding nature of his occupation, we will provide FMLA paperwork to keep him out of work for an additional week - to return 06/16/2018.  I advised that if he is still feeling like he cannot complete his job by next week, that he must come in to see PCP Dr. Sanda Klein for additional evaluation.  -Red flags and when to present for emergency care or RTC including fever >101.59F, chest pain, shortness of breath, new/worsening/un-resolving symptoms, myalgias reviewed with patient at time of visit. Follow up and care instructions discussed and provided in AVS.

## 2018-06-11 LAB — COMPLETE METABOLIC PANEL WITH GFR
AG RATIO: 1.9 (calc) (ref 1.0–2.5)
ALBUMIN MSPROF: 4.7 g/dL (ref 3.6–5.1)
ALT: 63 U/L — ABNORMAL HIGH (ref 9–46)
AST: 36 U/L — AB (ref 10–35)
Alkaline phosphatase (APISO): 90 U/L (ref 40–115)
BUN: 14 mg/dL (ref 7–25)
CO2: 21 mmol/L (ref 20–32)
Calcium: 9.8 mg/dL (ref 8.6–10.3)
Chloride: 107 mmol/L (ref 98–110)
Creat: 1.11 mg/dL (ref 0.70–1.33)
GFR, EST AFRICAN AMERICAN: 89 mL/min/{1.73_m2} (ref >=60)
GFR, EST NON AFRICAN AMERICAN: 77 mL/min/{1.73_m2} (ref >=60)
GLOBULIN: 2.5 g/dL (ref 1.9–3.7)
Glucose, Bld: 104 mg/dL (ref 65–139)
POTASSIUM: 4.2 mmol/L (ref 3.5–5.3)
SODIUM: 141 mmol/L (ref 135–146)
Total Bilirubin: 0.4 mg/dL (ref 0.2–1.2)
Total Protein: 7.2 g/dL (ref 6.1–8.1)

## 2018-06-11 LAB — CK: Total CK: 195 U/L (ref 44–196)

## 2018-06-13 ENCOUNTER — Ambulatory Visit: Payer: BLUE CROSS/BLUE SHIELD | Admitting: Family Medicine

## 2018-06-21 ENCOUNTER — Other Ambulatory Visit: Payer: Self-pay | Admitting: Family Medicine

## 2018-06-21 DIAGNOSIS — E785 Hyperlipidemia, unspecified: Secondary | ICD-10-CM

## 2018-07-10 ENCOUNTER — Other Ambulatory Visit: Payer: Self-pay | Admitting: Family Medicine

## 2018-07-17 ENCOUNTER — Other Ambulatory Visit: Payer: Self-pay

## 2018-07-17 ENCOUNTER — Telehealth: Payer: Self-pay

## 2018-07-17 NOTE — Telephone Encounter (Signed)
Letter created and signed stating pt is taking 25mg  Losartan and BP has been well controlled.  Review of office visits show BP well controlled for >1 year.  Labs show normal Kidney function 06/10/2018.

## 2018-07-17 NOTE — Telephone Encounter (Signed)
Copied from [place] [id number]. 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 He is wanting to go sometime this week. Pt informed that Dr Sanda Klein is not in the office and he is wondering if someone else is able to write this letter. He stated that he has seen Raquel Sarna before.

## 2018-07-18 ENCOUNTER — Other Ambulatory Visit: Payer: Self-pay | Admitting: Family Medicine

## 2018-07-18 DIAGNOSIS — E785 Hyperlipidemia, unspecified: Secondary | ICD-10-CM

## 2018-07-18 NOTE — Telephone Encounter (Signed)
When is Jesus Barber Endocrinology appointment? He really needs to see Dr. Gabriel Carina to discuss cholesterol management.  I will provide an additional refill of his vascepa today, but this needs to come from Dr. Gabriel Carina going forward once he sees her.

## 2018-07-28 ENCOUNTER — Ambulatory Visit: Payer: BLUE CROSS/BLUE SHIELD | Admitting: Family Medicine

## 2018-07-30 ENCOUNTER — Other Ambulatory Visit: Payer: Self-pay

## 2018-07-30 DIAGNOSIS — T783XXA Angioneurotic edema, initial encounter: Secondary | ICD-10-CM

## 2018-07-30 DIAGNOSIS — I10 Essential (primary) hypertension: Secondary | ICD-10-CM

## 2018-07-30 DIAGNOSIS — L509 Urticaria, unspecified: Secondary | ICD-10-CM

## 2018-07-30 DIAGNOSIS — E785 Hyperlipidemia, unspecified: Secondary | ICD-10-CM

## 2018-07-30 MED ORDER — ICOSAPENT ETHYL 1 G PO CAPS: 2.0000 | ORAL_CAPSULE | Freq: Two times a day (BID) | ORAL | 1 refills | Status: DC

## 2018-07-30 MED ORDER — METFORMIN HCL ER 500 MG PO TB24: 1000.0000 mg | ORAL_TABLET | Freq: Every day | ORAL | 1 refills | Status: DC

## 2018-07-30 MED ORDER — ESCITALOPRAM OXALATE 20 MG PO TABS: 20.0000 mg | ORAL_TABLET | Freq: Every day | ORAL | 1 refills | Status: DC

## 2018-07-30 MED ORDER — RANITIDINE HCL 150 MG PO TABS: 150.0000 mg | ORAL_TABLET | Freq: Two times a day (BID) | ORAL | 0 refills | Status: DC

## 2018-07-30 MED ORDER — LOSARTAN POTASSIUM 25 MG PO TABS: 25.0000 mg | ORAL_TABLET | Freq: Every day | ORAL | 0 refills | Status: DC

## 2018-07-30 NOTE — Telephone Encounter (Signed)
Pt is going to be losing ins wants to see about getting 90 day supply before Friday?

## 2018-08-26 ENCOUNTER — Other Ambulatory Visit: Payer: Self-pay | Admitting: Family Medicine

## 2018-08-26 DIAGNOSIS — I10 Essential (primary) hypertension: Secondary | ICD-10-CM

## 2018-08-27 ENCOUNTER — Other Ambulatory Visit: Payer: Self-pay | Admitting: Family Medicine

## 2018-08-27 DIAGNOSIS — E785 Hyperlipidemia, unspecified: Secondary | ICD-10-CM

## 2018-08-27 NOTE — Telephone Encounter (Signed)
Please let Jesus Barber know that he needs to get his Vascepa from Dr. Gabriel Carina going forward since she is managing his lipids. Thanks!

## 2018-09-03 ENCOUNTER — Other Ambulatory Visit: Payer: Self-pay | Admitting: Family Medicine

## 2018-09-03 DIAGNOSIS — I10 Essential (primary) hypertension: Secondary | ICD-10-CM

## 2018-09-03 NOTE — Telephone Encounter (Signed)
We just approved a 90 day supplyof losartan to [name] home delivery on 07/30/2018 This request for losartan is for a local pharmacy ([facility] in [place]) Please contact patient; he should not be out Clarify which pharmacy or pharmacies he is using

## 2018-09-04 NOTE — Telephone Encounter (Signed)
Called patient, mailbox was full

## 2018-09-12 ENCOUNTER — Telehealth: Payer: Self-pay | Admitting: Family Medicine

## 2018-09-12 NOTE — Telephone Encounter (Signed)
Rx  Request.  See message.

## 2018-09-12 NOTE — Telephone Encounter (Signed)
Copied from [place]. Topic: Quick Communication - Rx Refill/Question >> Sep 12, 2018  2:51 PM Burchel, Abbi R wrote: Medication: losartan (COZAAR) 25 MG tablet   Pt states he did not receive his 90 day supply from the mail order pharmacy du to an insurance issue and is currently out of this medication.    Preferred Pharmacy: [id number] [address], [place] AT [facility] OF [address] [place] [address] Phone: [phone] Fax: [phone]   Pt was advised that RX refills may take up to 3 business days. We ask that you follow-up with your pharmacy.

## 2018-09-15 ENCOUNTER — Other Ambulatory Visit: Payer: Self-pay | Admitting: Nurse Practitioner

## 2018-09-15 DIAGNOSIS — I10 Essential (primary) hypertension: Secondary | ICD-10-CM

## 2018-09-15 MED ORDER — LOSARTAN POTASSIUM 25 MG PO TABS: 25.0000 mg | ORAL_TABLET | Freq: Every day | ORAL | 0 refills | Status: DC

## 2018-09-30 ENCOUNTER — Other Ambulatory Visit: Payer: Self-pay | Admitting: Family Medicine

## 2018-10-05 ENCOUNTER — Other Ambulatory Visit: Payer: Self-pay | Admitting: Family Medicine

## 2018-10-05 NOTE — Telephone Encounter (Signed)
Request for Lexapro came in from local pharmacy Denied I approved a 6 month supply to mail order in September Please contact patient and find out what's going on

## 2018-10-06 ENCOUNTER — Other Ambulatory Visit: Payer: Self-pay

## 2018-10-06 MED ORDER — METFORMIN HCL ER 500 MG PO TB24: 1000.0000 mg | ORAL_TABLET | Freq: Every day | ORAL | 1 refills | Status: DC

## 2018-10-06 MED ORDER — ESCITALOPRAM OXALATE 20 MG PO TABS: 20.0000 mg | ORAL_TABLET | Freq: Every day | ORAL | 1 refills | Status: DC

## 2018-10-06 NOTE — Telephone Encounter (Signed)
Last Cr reviewed; Rx approved 

## 2018-10-07 NOTE — Telephone Encounter (Signed)
Called patient. He stated that he never received any of his 6 month scripts from the mail pharmacy due to no insurance and that he has been taking his medications as directed.

## 2018-10-24 NOTE — Telephone Encounter (Signed)
Left message informing patient to request refill from Dr. Gabriel Carina office.

## 2018-12-07 IMAGING — US US EXTREM LOW VENOUS*L*
1 series · 13 of 24 positions shown · non-contrast
Comparison: None.

CLINICAL DATA: Left foot swelling for 4 hours.



[Series 1: us extrem low venous*left* · 0.07mm/px · 13 of 33 slices shown]
[im 1/33]
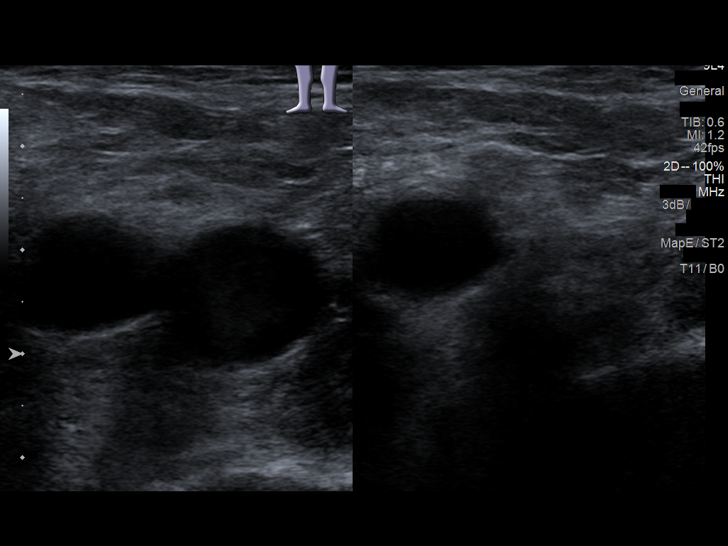
[im 3/33]
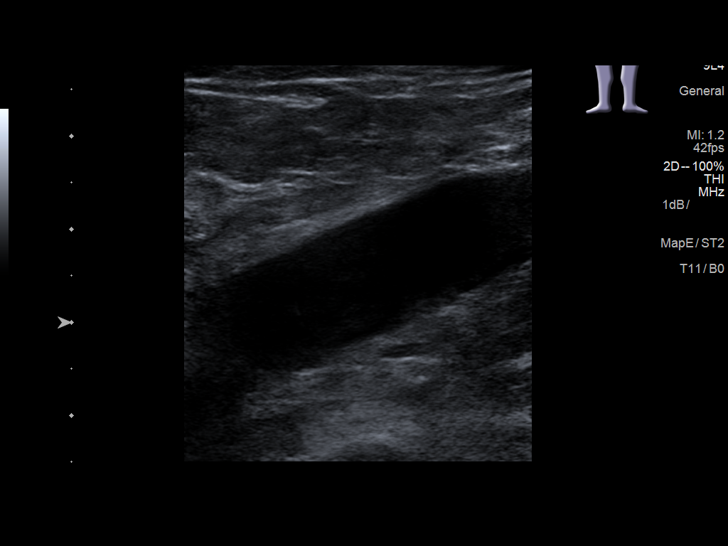
[im 6/33]
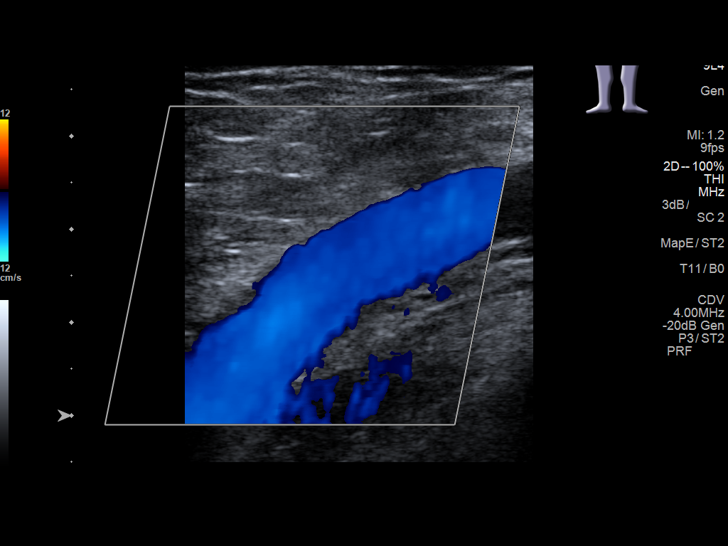
[im 9/33]
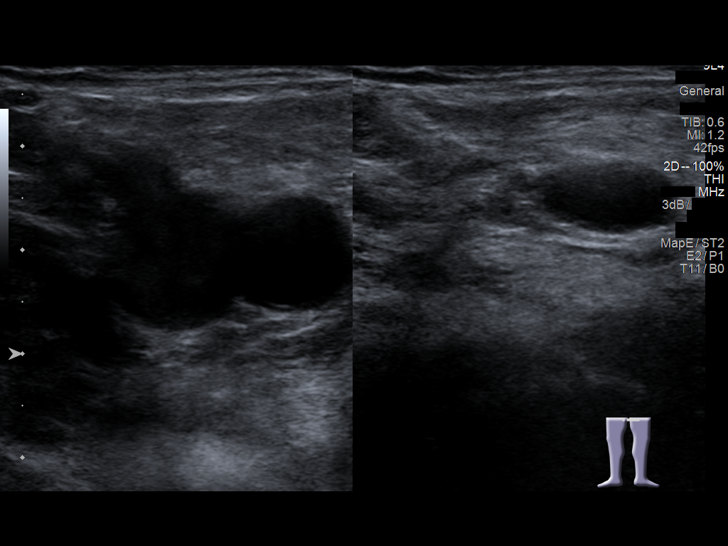
[im 12/33]
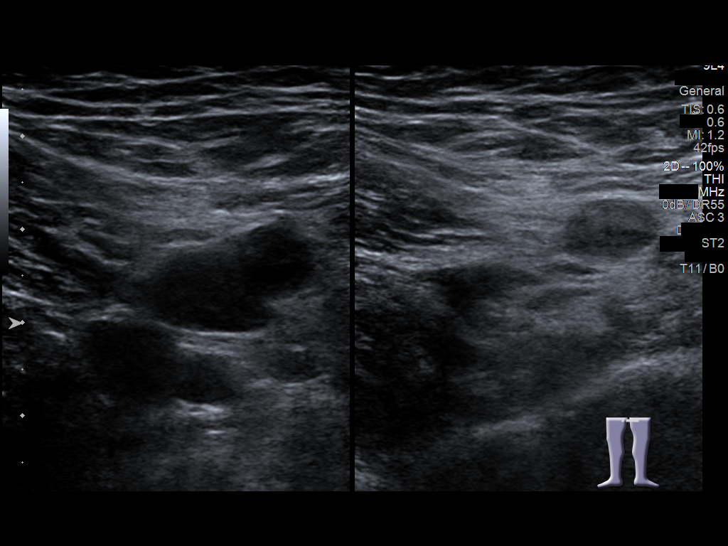
[im 14/33]
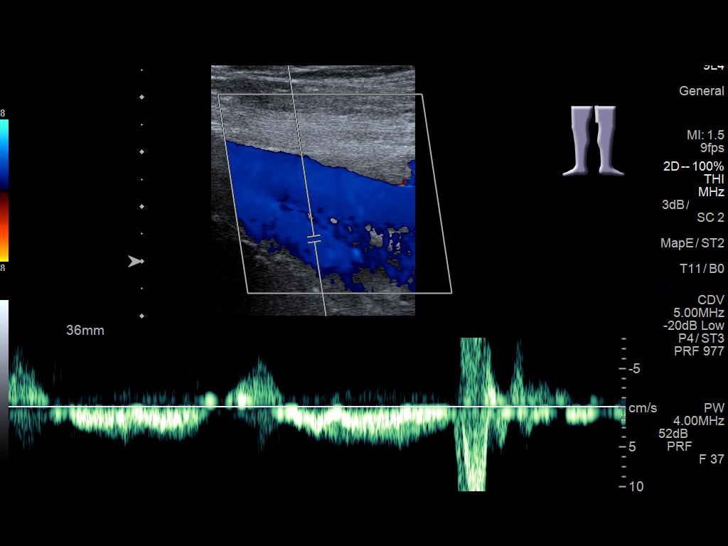
[im 17/33]
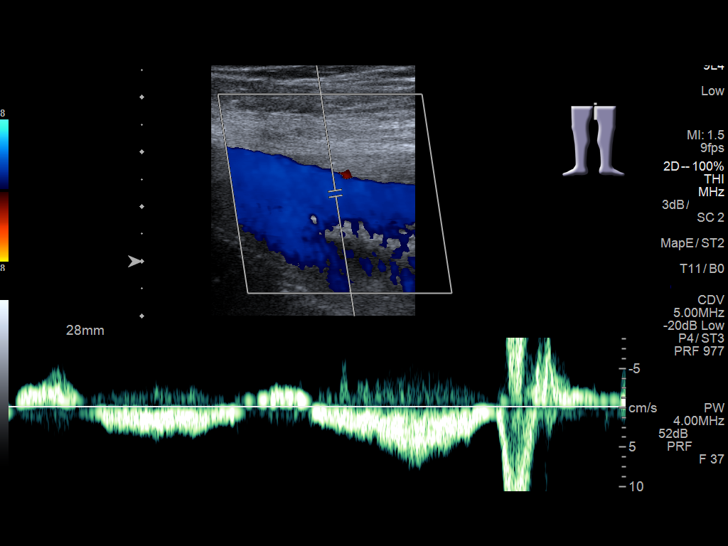
[im 19/33]
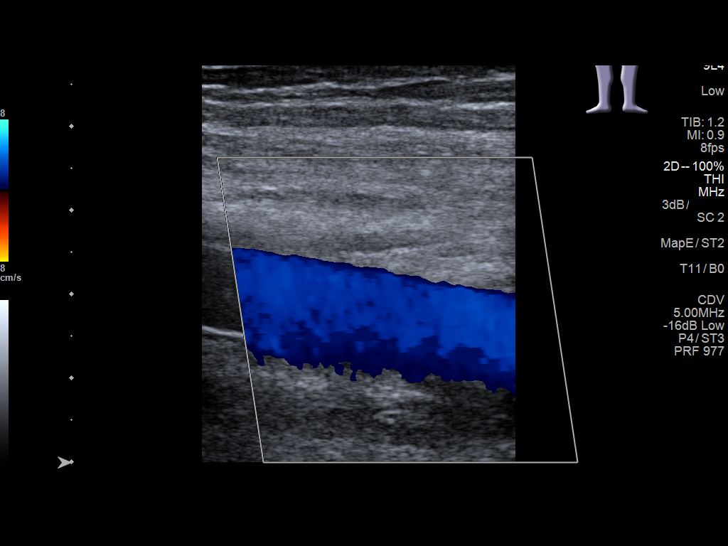
[im 21/33]
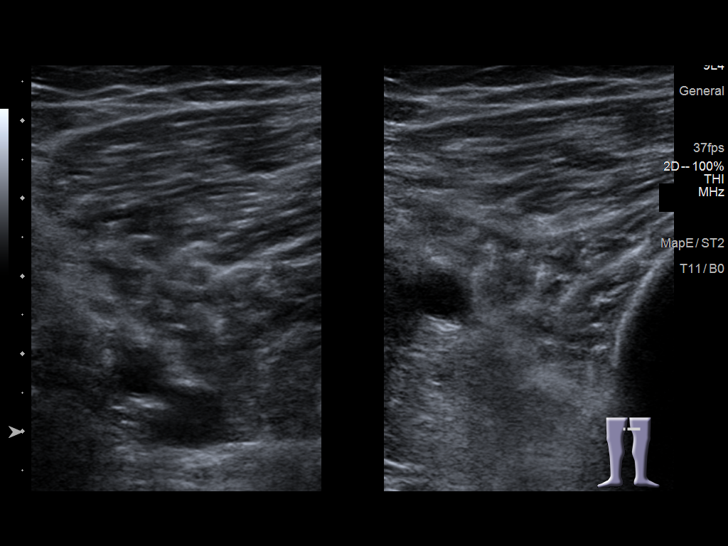
[im 24/33]
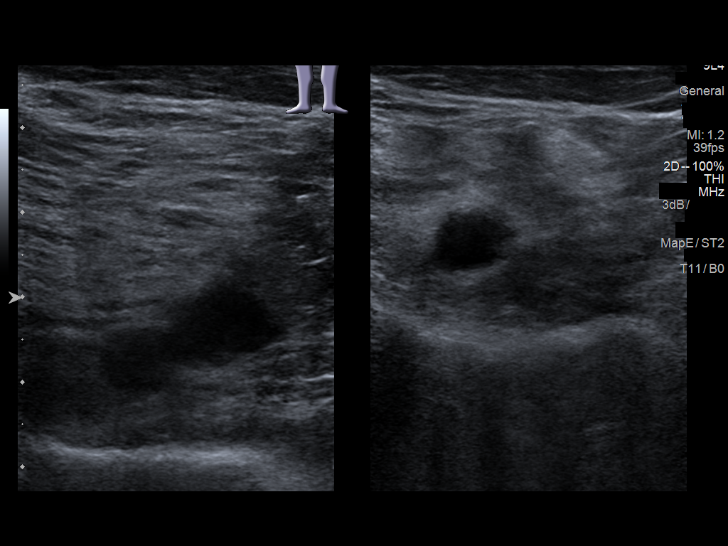
[im 27/33]
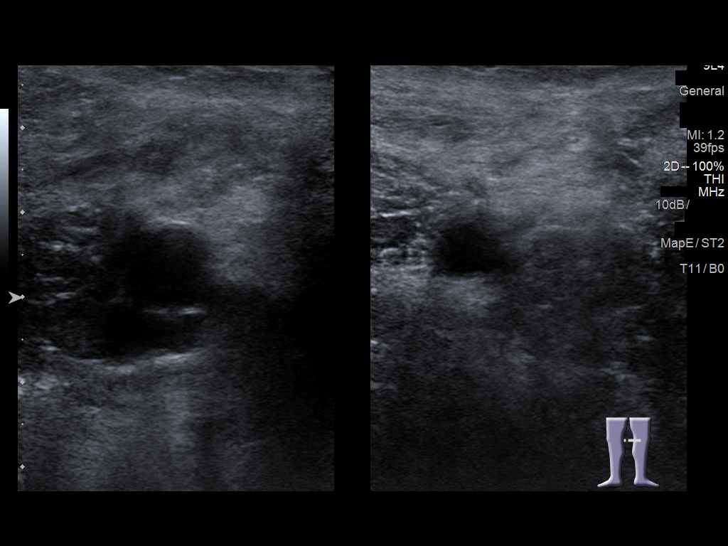
[im 30/33]
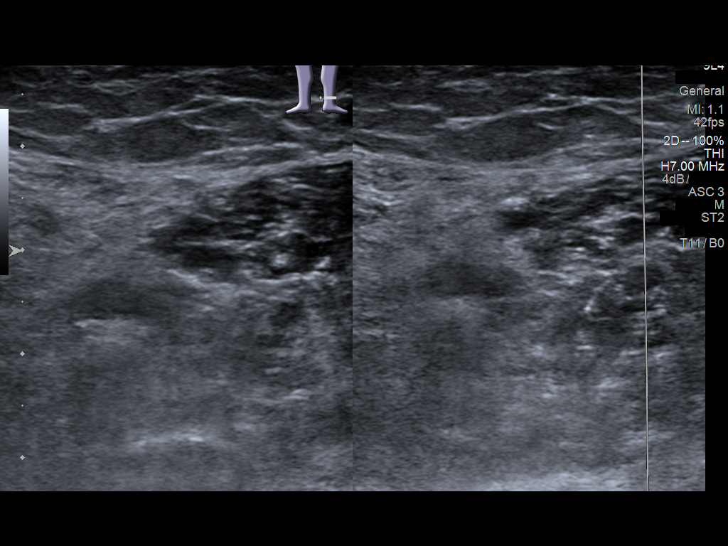
[im 33/33]
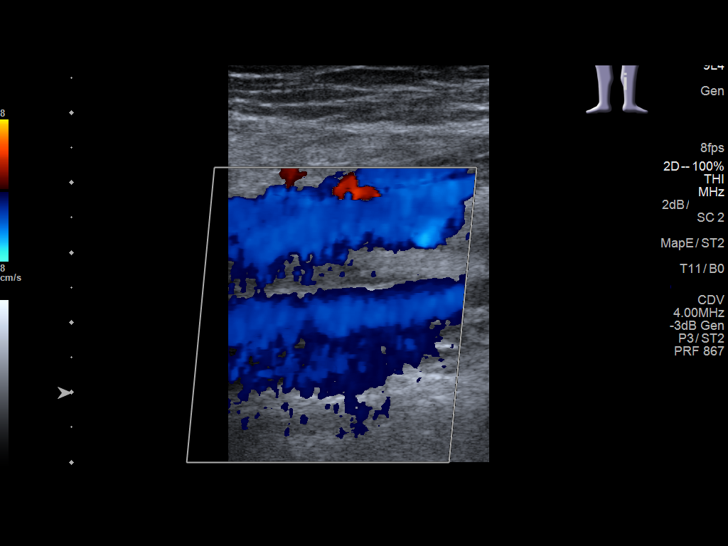

[13 of 24 positions shown; findings below may reference images not displayed]

FINDINGS: Contralateral Common Femoral Vein: Respiratory phasicity is normal
and symmetric with the symptomatic side. No evidence of thrombus.
Normal compressibility.

Common Femoral Vein: No evidence of thrombus. Normal
compressibility, respiratory phasicity and response to augmentation.

Saphenofemoral Junction: No evidence of thrombus. Normal
compressibility and flow on color Doppler imaging.

Profunda Femoral Vein: No evidence of thrombus. Normal
compressibility and flow on color Doppler imaging.

Femoral Vein: No evidence of thrombus. Normal compressibility,
respiratory phasicity and response to augmentation.

Popliteal Vein: No evidence of thrombus. Normal compressibility,
respiratory phasicity and response to augmentation.

Calf Veins: No evidence of thrombus. Normal compressibility and flow
on color Doppler imaging.

Superficial Great Saphenous Vein: No evidence of thrombus. Normal
compressibility.

Venous Reflux:  None.

Other Findings:  None.
IMPRESSION: No evidence of deep venous thrombosis.

## 2018-12-18 ENCOUNTER — Telehealth: Payer: Self-pay | Admitting: Nurse Practitioner

## 2018-12-18 DIAGNOSIS — I10 Essential (primary) hypertension: Secondary | ICD-10-CM

## 2018-12-19 NOTE — Telephone Encounter (Signed)
appt made for 2.13.2020

## 2018-12-19 NOTE — Telephone Encounter (Signed)
Patient is due for a visit and labs I'll send in one refill

## 2019-01-01 ENCOUNTER — Encounter: Payer: Self-pay | Admitting: Nurse Practitioner

## 2019-01-01 ENCOUNTER — Ambulatory Visit: Payer: Managed Care, Other (non HMO) | Admitting: Nurse Practitioner

## 2019-01-01 VITALS — BP 122/78 | HR 79 | Temp 98.0°F | Resp 16 | Ht 72.0 in | Wt 221.0 lb

## 2019-01-01 DIAGNOSIS — I10 Essential (primary) hypertension: Secondary | ICD-10-CM | POA: Diagnosis not present

## 2019-01-01 DIAGNOSIS — F411 Generalized anxiety disorder: Secondary | ICD-10-CM

## 2019-01-01 DIAGNOSIS — R7301 Impaired fasting glucose: Secondary | ICD-10-CM

## 2019-01-01 DIAGNOSIS — Z5181 Encounter for therapeutic drug level monitoring: Secondary | ICD-10-CM

## 2019-01-01 DIAGNOSIS — E785 Hyperlipidemia, unspecified: Secondary | ICD-10-CM | POA: Diagnosis not present

## 2019-01-01 DIAGNOSIS — Z1211 Encounter for screening for malignant neoplasm of colon: Secondary | ICD-10-CM

## 2019-01-01 MED ORDER — METFORMIN HCL ER 500 MG PO TB24: 1000.0000 mg | ORAL_TABLET | Freq: Every day | ORAL | 1 refills | Status: DC

## 2019-01-01 MED ORDER — LOSARTAN POTASSIUM 25 MG PO TABS: 25.0000 mg | ORAL_TABLET | Freq: Every day | ORAL | 1 refills | Status: DC

## 2019-01-01 MED ORDER — ICOSAPENT ETHYL 1 G PO CAPS: 2.0000 | ORAL_CAPSULE | Freq: Two times a day (BID) | ORAL | 1 refills | Status: DC

## 2019-01-01 MED ORDER — ESCITALOPRAM OXALATE 10 MG PO TABS: 10.0000 mg | ORAL_TABLET | Freq: Every day | ORAL | 2 refills | Status: DC

## 2019-01-01 NOTE — Progress Notes (Signed)
Name: Jesus Barber   MRN: [id number]    DOB: 23-Jan-1966   Date:01/01/2019       Progress Note  Subjective  Chief Complaint  Chief Complaint  Patient presents with  . Medication Refill  . Labs Only    HPI  Patient presents for routine follow-up for the following chronic conditions:  Hypertension: Patient takes losartan 25mg  daily. Chest pain, headaches.   Dyslipidemia: patient takes fenofibrate 145mg  daily. States did not have a refill and has been out of  vascepa 2g BID in 3 months. Managed by endocrinology- Dr. Gabriel Carina. Had rhabdomyolysis when he was on statin. 04/2018 triglycerides 805- previously were 2,451.  Eats a lot of chicken, some fried, hamburger, steaks once a week. Eats at least a vegetable a meal. Drives a lot- eats a lot of sandwiches and fast food. Drinks a lot of water, one soda a day if even. 2-3 beers.   Prediabetes: patient is taking metformin 1000mg  daily. Managed by endocrinology- Dr. Gabriel Carina  01/2018 A1C was 6%  Anxiety: takes lexapro 20mg  daily and has been on that for the last 1-2 years and states has been feeling well for a few months and would like to titrate it down.  Depression screen [facility] 2/9 01/01/2019 06/10/2018 05/27/2018 05/19/2018 01/24/2018  Decreased Interest 0 0 0 0 0  Down, Depressed, Hopeless 0 0 0 0 0  PHQ - 2 Score 0 0 0 0 0  Altered sleeping - 0 0 0 -  Tired, decreased energy - 0 0 0 -  Change in appetite - 0 0 0 -  Feeling bad or failure about yourself  - 0 0 0 -  Trouble concentrating - 0 0 0 -  Moving slowly or fidgety/restless - 0 0 0 -  Suicidal thoughts - 0 0 0 -  PHQ-9 Score - 0 0 0 -  Difficult doing work/chores - Not difficult at all Not difficult at all - -   GAD 7 : Generalized Anxiety Score 01/01/2019  Nervous, Anxious, on Edge 0  Control/stop worrying 0  Worry too much - different things 0  Trouble relaxing 0  Restless 0  Easily annoyed or irritable 0  Afraid - awful might happen 0  Total GAD 7 Score 0  Anxiety Difficulty  Not difficult at all      Patient Active Problem List   Diagnosis Date Noted  . Non-traumatic rhabdomyolysis 05/27/2018  . Obesity (BMI 30.0-34.9) 01/24/2018  . Lipoma 01/24/2018  . Impaired fasting glucose 01/24/2018  . Tinnitus, bilateral 01/24/2018  . Medication monitoring encounter 02/22/2017  . Hypertriglyceridemia 07/21/2016  . Tear of medial collateral ligament of elbow, right, subsequent encounter 01/25/2016  . Stress 01/13/2016  . Mild alcohol use disorder 12/04/2015  . Low serum testosterone level 08/14/2015  . Dyslipidemia 08/12/2015  . Essential hypertension, benign 08/12/2015  . Renal insufficiency 08/12/2015  . Elevated serum glutamic pyruvic transaminase (SGPT) level 08/12/2015  . Shoulder pain, right 08/12/2015    Past Medical History:  Diagnosis Date  . Ankle fracture   . Back pain    Herniated Disk  . Chronic kidney disease   . Elevated liver enzymes   . Fatty liver   . Hyperlipidemia   . Hypertension   . Hypogonadism in male   . IFG (impaired fasting glucose)     Past Surgical History:  Procedure Laterality Date  . BACK SURGERY     Discectomy    Social History   Tobacco Use  . Smoking status:  Former Smoker    Years: 25.00    Types: Cigarettes    Last attempt to quit: 11/20/2003    Years since quitting: 15.1  . Smokeless tobacco: Current User  Substance Use Topics  . Alcohol use: Yes    Comment: on occasion      Current Outpatient Medications:  .  diphenhydrAMINE (BENADRYL ALLERGY) 25 MG tablet, Take 1 tablet (25 mg total) by mouth every 8 (eight) hours as needed (Until rash and lip swelling improve)., Disp: 30 tablet, Rfl: 0 .  escitalopram (LEXAPRO) 20 MG tablet, Take 1 tablet (20 mg total) by mouth daily., Disp: 90 tablet, Rfl: 1 .  fenofibrate (TRICOR) 145 MG tablet, Take 145 mg by mouth daily., Disp: , Rfl:  .  Icosapent Ethyl (VASCEPA) 1 g CAPS, Take 2 capsules (2 g total) by mouth 2 (two) times daily., Disp: 360 capsule, Rfl: 1 .   losartan (COZAAR) 25 MG tablet, TAKE 1 TABLET BY MOUTH DAILY. DISCONTINUE LISINOPRIL, Disp: 30 tablet, Rfl: 0 .  metFORMIN (GLUCOPHAGE-XR) 500 MG 24 hr tablet, Take 2 tablets (1,000 mg total) by mouth daily., Disp: 180 tablet, Rfl: 1 .  ranitidine (ZANTAC) 150 MG tablet, Take 1 tablet (150 mg total) by mouth 2 (two) times daily., Disp: 180 tablet, Rfl: 0  Allergies  Allergen Reactions  . Ace Inhibitors Swelling    Angioedema and rash 05/23/2018  . Levaquin [Levofloxacin In D5w] Swelling    Angioedema and Rash 05/23/2018    Review of Systems  Constitutional: Negative for chills, fever and malaise/fatigue.  Respiratory: Negative for cough and sputum production.   Cardiovascular: Negative for chest pain and palpitations.  Gastrointestinal: Negative for abdominal pain, constipation, diarrhea, nausea and vomiting.  Genitourinary: Negative for dysuria, hematuria and urgency.  Musculoskeletal: Negative for myalgias.  Skin: Negative for rash.  Neurological: Negative for dizziness and headaches.    No other specific complaints in a complete review of systems (except as listed in HPI above).  Objective  Vitals:   01/01/19 0943  BP: 122/78  Pulse: 79  Resp: 16  Temp: 98 F (36.7 C)  TempSrc: Oral  SpO2: 96%  Weight: 221 lb (100.2 kg)  Height: 6' (1.829 m)    Body mass index is 29.97 kg/m.  Nursing Note and Vital Signs reviewed.  Physical Exam Vitals signs reviewed.  Constitutional:      Appearance: He is well-developed.  HENT:     Head: Normocephalic and atraumatic.     Nose: Nose normal.     Mouth/Throat:     Mouth: Mucous membranes are moist.     Pharynx: Oropharynx is clear. No posterior oropharyngeal erythema.  Eyes:     Conjunctiva/sclera: Conjunctivae normal.  Neck:     Musculoskeletal: Normal range of motion and neck supple.     Thyroid: No thyromegaly.     Vascular: No carotid bruit.  Cardiovascular:     Pulses: Normal pulses.     Heart sounds: Normal heart  sounds.  Pulmonary:     Effort: Pulmonary effort is normal.     Breath sounds: Normal breath sounds.  Abdominal:     General: Bowel sounds are normal.     Palpations: Abdomen is soft.     Tenderness: There is no abdominal tenderness.  Musculoskeletal: Normal range of motion.  Lymphadenopathy:     Cervical: No cervical adenopathy.  Skin:    General: Skin is warm and dry.     Capillary Refill: Capillary refill takes less than 2 seconds.  Neurological:     Mental Status: He is alert and oriented to person, place, and time.     GCS: GCS eye subscore is 4. GCS verbal subscore is 5. GCS motor subscore is 6.     Sensory: No sensory deficit.  Psychiatric:        Speech: Speech normal.        Behavior: Behavior normal.        Thought Content: Thought content normal.        Judgment: Judgment normal.      No results found for this or any previous visit (from the past 48 hour(s)).  Assessment & Plan  1. Essential hypertension, benign Well controlled, continue with plan, discussed small chance for similar ACEi reaction - COMPLETE METABOLIC PANEL WITH GFR - losartan (COZAAR) 25 MG tablet; Take 1 tablet (25 mg total) by mouth daily.  Dispense: 90 tablet; Refill: 1  2. Dyslipidemia Discussed diet and reduction of alcohol. Limit to less than 2 standard drinks at one time- and only occasionally for social events. Discussed healthy eating on to go. Restart vascepa- followup with endocrine.  - Lipid Profile - metFORMIN (GLUCOPHAGE-XR) 500 MG 24 hr tablet; Take 2 tablets (1,000 mg total) by mouth daily.  Dispense: 180 tablet; Refill: 1 - Icosapent Ethyl (VASCEPA) 1 g CAPS; Take 2 capsules (2 g total) by mouth 2 (two) times daily.  Dispense: 360 capsule; Refill: 1  3. Impaired fasting glucose - HgB A1c - Lipid Profile - metFORMIN (GLUCOPHAGE-XR) 500 MG 24 hr tablet; Take 2 tablets (1,000 mg total) by mouth daily.  Dispense: 180 tablet; Refill: 1  4. Medication monitoring encounter -  COMPLETE METABOLIC PANEL WITH GFR  5. Screening for colon cancer - Ambulatory referral to Gastroenterology  6. GAD (generalized anxiety disorder) Discussed titration plan- will see how he does on 10mg  and follow up in 3 months to see if he needs to come down.  - escitalopram (LEXAPRO) 10 MG tablet; Take 1 tablet (10 mg total) by mouth daily.  Dispense: 30 tablet; Refill: 2    -Red flags and when to present for emergency care or RTC including fever >101.32F, chest pain, shortness of breath, new/worsening/un-resolving symptoms,  reviewed with patient at time of visit. Follow up and care instructions discussed and provided in AVS.

## 2019-01-01 NOTE — Patient Instructions (Addendum)
- Please make sure you follow up with an eye doctor within the next month.  High Triglycerides Eating Plan Triglycerides are a type of fat in the blood. High levels of triglycerides can increase your risk of heart disease and stroke. If your triglyceride levels are high, choosing the right foods can help lower your triglycerides and keep your heart healthy. Work with your health care provider or a diet and nutrition specialist (dietitian) to develop an eating plan that is right for you. What are tips for following this plan? General guidelines  Lose weight, if you are overweight. For most people, losing 5-10 lbs (2-5 kg) helps lower triglyceride levels. A weight-loss plan may include. ? 30 minutes of exercise at least 5 days a week. ? Reducing the amount of calories, sugar, and fat you eat.  Eat a wide variety of fresh fruits, vegetables, and whole grains. These foods are high in fiber.  Eat foods that contain healthy fats, such as fatty fish, nuts, seeds, and olive oil.  Avoid foods that are high in added sugar, added salt (sodium), saturated fat, and trans fat.  Avoid low-fiber, refined carbohydrates such as white bread, crackers, noodles, and white rice.  Avoid foods with partially hydrogenated oils (trans fats), such as fried foods or stick margarine.  Limit alcohol intake to no more than 1 drink a day for nonpregnant women and 2 drinks a day for men. One drink equals 12 oz of beer, 5 oz of wine, or 1 oz of hard liquor. Your health care provider may recommend that you drink less depending on your overall health. Reading food labels  Check food labels for the amount of saturated fat. Choose foods with no or very little saturated fat.  Check food labels for the amount of trans fat. Choose foods with no trans fat.  Check food labels for the amount of cholesterol. Choose foods low in cholesterol. Ask your dietitian how much cholesterol you should have each day.  Check food labels for  the amount of sodium. Choose foods with less than 140 milligrams (mg) per serving. Shopping  Buy dairy products labeled as nonfat (skim) or low-fat (1%).  Avoid buying processed or prepackaged foods. These are often high in added sugar, sodium, and fat. Cooking  Choose healthy fats when cooking, such as olive oil or canola oil.  Cook foods using lower fat methods, such as baking, broiling, boiling, or grilling.  Make your own sauces, dressings, and marinades when possible, instead of buying them. Store-bought sauces, dressings, and marinades are often high in sodium and sugar. Meal planning  Eat more home-cooked food and less restaurant, buffet, and fast food.  Eat fatty fish at least 2 times each week. Examples of fatty fish include salmon, trout, mackerel, tuna, and herring.  If you eat whole eggs, do not eat more than 3 egg yolks per week. What foods are recommended? The items listed may not be a complete list. Talk with your dietitian about what dietary choices are best for you. Grains Whole wheat or whole grain breads, crackers, cereals, and pasta. Unsweetened oatmeal. Bulgur. Barley. Quinoa. Brown rice. Whole wheat flour tortillas. Vegetables Fresh or frozen vegetables. Low-sodium canned vegetables. Fruits All fresh, canned (in natural juice), or frozen fruits. Meats and other protein foods Skinless chicken or [place]. Ground chicken or [place]. Lean cuts of pork, trimmed of fat. Fish and seafood, especially salmon, trout, and herring. Egg whites. Dried beans, peas, or lentils. Unsalted nuts or seeds. Unsalted canned beans. Natural  peanut or almond butter. Dairy Low-fat dairy products. Skim or low-fat (1%) milk. Reduced fat (2%) and low-sodium cheese. Low-fat ricotta cheese. Low-fat cottage cheese. Plain, low-fat yogurt. Fats and oils Tub margarine without trans fats. Light or reduced-fat mayonnaise. Light or reduced-fat salad dressings. Avocado. Safflower, olive, sunflower,  soybean, and canola oils. What foods are not recommended? The items listed may not be a complete list. Talk with your dietitian about what dietary choices are best for you. Grains White bread. White (regular) pasta. White rice. Cornbread. Bagels. Pastries. Crackers that contain trans fat. Vegetables Creamed or fried vegetables. Vegetables in a cheese sauce. Fruits Sweetened dried fruit. Canned fruit in syrup. Fruit juice. Meats and other protein foods Fatty cuts of meat. Ribs. Chicken wings. Berniece Salines. Sausage. Bologna. Salami. Chitterlings. Fatback. Hot dogs. Bratwurst. Packaged lunch meats. Dairy Whole or reduced-fat (2%) milk. Half-and-half. Cream cheese. Full-fat or sweetened yogurt. Full-fat cheese. Nondairy creamers. Whipped toppings. Processed cheese or cheese spreads. Cheese curds. Beverages Alcohol. Sweetened drinks, such as soda, lemonade, fruit drinks, or punches. Fats and oils Butter. Stick margarine. Lard. Shortening. Ghee. Bacon fat. Tropical oils, such as coconut, palm kernel, or palm oils. Sweets and desserts Corn syrup. Sugars. Honey. Molasses. Candy. Jam and jelly. Syrup. Sweetened cereals. Cookies. Pies. Cakes. Donuts. Muffins. Ice cream. Condiments Store-bought sauces, dressings, and marinades that are high in sugar, such as ketchup and barbecue sauce. Summary  High levels of triglycerides can increase the risk of heart disease and stroke. Choosing the right foods can help lower your triglycerides.  Eat plenty of fresh fruits, vegetables, and whole grains. Choose low-fat dairy and lean meats. Eat fatty fish at least twice a week.  Avoid processed and prepackaged foods with added sugar, sodium, saturated fat, and trans fat.  If you need suggestions or have questions about what types of food are good for you, talk with your health care provider or a dietitian. This information is not intended to replace advice given to you by your health care provider. Make sure you  discuss any questions you have with your health care provider. Document Released: 08/23/2004 Document Revised: 01/08/2017 Document Reviewed: 01/08/2017 Elsevier Interactive Patient Education  2019 [employer].

## 2019-01-02 LAB — COMPLETE METABOLIC PANEL WITH GFR
AG Ratio: 2.2 (calc) (ref 1.0–2.5)
ALBUMIN MSPROF: 4.8 g/dL (ref 3.6–5.1)
ALKALINE PHOSPHATASE (APISO): 62 U/L (ref 35–144)
ALT: 41 U/L (ref 9–46)
AST: 29 U/L (ref 10–35)
BILIRUBIN TOTAL: 0.5 mg/dL (ref 0.2–1.2)
BUN: 16 mg/dL (ref 7–25)
CHLORIDE: 104 mmol/L (ref 98–110)
CO2: 28 mmol/L (ref 20–32)
Calcium: 10 mg/dL (ref 8.6–10.3)
Creat: 1.22 mg/dL (ref 0.70–1.33)
GFR, Est African American: 79 mL/min/{1.73_m2} (ref >=60)
GFR, Est Non African American: 68 mL/min/{1.73_m2} (ref >=60)
GLOBULIN: 2.2 g/dL (ref 1.9–3.7)
Glucose, Bld: 107 mg/dL — ABNORMAL HIGH (ref 65–99)
Potassium: 4.7 mmol/L (ref 3.5–5.3)
SODIUM: 140 mmol/L (ref 135–146)
Total Protein: 7 g/dL (ref 6.1–8.1)

## 2019-01-02 LAB — HEMOGLOBIN A1C
HEMOGLOBIN A1C: 5.8 %{Hb} — AB (ref <5.7)
Mean Plasma Glucose: 120 (calc)
eAG (mmol/L): 6.6 (calc)

## 2019-01-02 LAB — LIPID PANEL
CHOL/HDL RATIO: 9.7 (calc) — AB (ref <5.0)
Cholesterol: 282 mg/dL — ABNORMAL HIGH (ref <200)
HDL: 29 mg/dL — ABNORMAL LOW (ref >=40)
NON-HDL CHOLESTEROL (CALC): 253 mg/dL — AB (ref <130)
Triglycerides: 1740 mg/dL — ABNORMAL HIGH (ref <150)

## 2019-01-08 ENCOUNTER — Other Ambulatory Visit: Payer: Self-pay

## 2019-01-20 ENCOUNTER — Encounter: Payer: Self-pay | Admitting: *Deleted

## 2019-04-02 ENCOUNTER — Encounter: Payer: Self-pay | Admitting: Nurse Practitioner

## 2019-04-02 ENCOUNTER — Ambulatory Visit: Payer: Managed Care, Other (non HMO) | Admitting: Nurse Practitioner

## 2019-04-02 ENCOUNTER — Other Ambulatory Visit: Payer: Self-pay

## 2019-04-02 VITALS — BP 120/76 | HR 88 | Temp 98.9°F | Resp 14 | Ht 73.0 in | Wt 226.5 lb

## 2019-04-02 DIAGNOSIS — F411 Generalized anxiety disorder: Secondary | ICD-10-CM

## 2019-04-02 DIAGNOSIS — R7301 Impaired fasting glucose: Secondary | ICD-10-CM | POA: Diagnosis not present

## 2019-04-02 DIAGNOSIS — E785 Hyperlipidemia, unspecified: Secondary | ICD-10-CM | POA: Diagnosis not present

## 2019-04-02 DIAGNOSIS — I10 Essential (primary) hypertension: Secondary | ICD-10-CM | POA: Diagnosis not present

## 2019-04-02 MED ORDER — LOSARTAN POTASSIUM 25 MG PO TABS: 25.0000 mg | ORAL_TABLET | Freq: Every day | ORAL | 0 refills | Status: DC

## 2019-04-02 MED ORDER — ESCITALOPRAM OXALATE 20 MG PO TABS: 20.0000 mg | ORAL_TABLET | Freq: Every day | ORAL | 0 refills | Status: DC

## 2019-04-02 MED ORDER — METFORMIN HCL ER 500 MG PO TB24: 1000.0000 mg | ORAL_TABLET | Freq: Every day | ORAL | 0 refills | Status: DC

## 2019-04-02 NOTE — Progress Notes (Signed)
Virtual Visit via Video Note  I connected with Jesus Barber on 04/02/19 at 11:40 AM EDT by a video enabled telemedicine application and verified that I am speaking with the correct person using two identifiers.   Staff discussed the limitations of evaluation and management by telemedicine and the availability of in person appointments. The patient expressed understanding and agreed to proceed.  Patient location: home  My location: home office Other people present:  None Started with video visit but due to sound quality issue switched to telephone.  HPI  Hypertension  takes losartan 25mg  daily. Has had hypertension for many years and has been on this stable dose for awhile now.  BP Readings from Last 3 Encounters:  04/02/19 120/76  01/01/19 122/78  06/10/18 124/82    Dyslipidemia Takes fenofibrate 145mg  daily and vascepa 2g BID. Managed by endocrinology- Dr. Gabriel Carina. Had rhabdomyolysis when he was on statin.  Eats a lot of chicken, some fried, hamburger, steaks once a week. Eats at least a vegetable a meal. Drives a lot- eats a lot of sandwiches and fast food. Drinks a lot of water, one soda a day if even. 2-3 beers.    Prediabetes Takes metformin 1000mg  daily. States with quarantine has been eating more but not eating as much as fastfood.  Lab Results  Component Value Date   HGBA1C 5.8 (H) 01/01/2019   Anxiety Takes lexapro 10mg  daily was on lexapro 20mg  for the past couple of years but titrated down per patient request 3 months ago. States would like to go back to the 20mg  notes moed on edge and aggitated.     GAD 7 : Generalized Anxiety Score 01/01/2019  Nervous, Anxious, on Edge 0  Control/stop worrying 0  Worry too much - different things 0  Trouble relaxing 0  Restless 0  Easily annoyed or irritable 0  Afraid - awful might happen 0  Total GAD 7 Score 0  Anxiety Difficulty Not difficult at all    PHQ2/9: Depression screen [facility] 2/9 04/02/2019 01/01/2019 06/10/2018  05/27/2018 05/19/2018  Decreased Interest 0 0 0 0 0  Down, Depressed, Hopeless 0 0 0 0 0  PHQ - 2 Score 0 0 0 0 0  Altered sleeping 0 - 0 0 0  Tired, decreased energy 0 - 0 0 0  Change in appetite 0 - 0 0 0  Feeling bad or failure about yourself  0 - 0 0 0  Trouble concentrating 0 - 0 0 0  Moving slowly or fidgety/restless 0 - 0 0 0  Suicidal thoughts 0 - 0 0 0  PHQ-9 Score 0 - 0 0 0  Difficult doing work/chores Not difficult at all - Not difficult at all Not difficult at all -    PHQ reviewed. Negative  Patient Active Problem List   Diagnosis Date Noted  . Non-traumatic rhabdomyolysis 05/27/2018  . Obesity (BMI 30.0-34.9) 01/24/2018  . Lipoma 01/24/2018  . Impaired fasting glucose 01/24/2018  . Tinnitus, bilateral 01/24/2018  . Medication monitoring encounter 02/22/2017  . Hypertriglyceridemia 07/21/2016  . Tear of medial collateral ligament of elbow, right, subsequent encounter 01/25/2016  . Stress 01/13/2016  . Mild alcohol use disorder 12/04/2015  . Low serum testosterone level 08/14/2015  . Dyslipidemia 08/12/2015  . Essential hypertension, benign 08/12/2015  . Renal insufficiency 08/12/2015  . Elevated serum glutamic pyruvic transaminase (SGPT) level 08/12/2015  . Shoulder pain, right 08/12/2015    Past Medical History:  Diagnosis Date  . Ankle fracture   . Back  pain    Herniated Disk  . Chronic kidney disease   . Elevated liver enzymes   . Fatty liver   . Hyperlipidemia   . Hypertension   . Hypogonadism in male   . IFG (impaired fasting glucose)     Past Surgical History:  Procedure Laterality Date  . BACK SURGERY     Discectomy    Social History   Tobacco Use  . Smoking status: Former Smoker    Years: 25.00    Types: Cigarettes    Last attempt to quit: 11/20/2003    Years since quitting: 15.3  . Smokeless tobacco: Current User  Substance Use Topics  . Alcohol use: Yes    Comment: on occasion      Current Outpatient Medications:  .   escitalopram (LEXAPRO) 10 MG tablet, Take 1 tablet (10 mg total) by mouth daily., Disp: 30 tablet, Rfl: 2 .  fenofibrate (TRICOR) 145 MG tablet, Take 145 mg by mouth daily., Disp: , Rfl:  .  Icosapent Ethyl (VASCEPA) 1 g CAPS, Take 2 capsules (2 g total) by mouth 2 (two) times daily., Disp: 360 capsule, Rfl: 1 .  losartan (COZAAR) 25 MG tablet, Take 1 tablet (25 mg total) by mouth daily., Disp: 90 tablet, Rfl: 1 .  metFORMIN (GLUCOPHAGE-XR) 500 MG 24 hr tablet, Take 2 tablets (1,000 mg total) by mouth daily., Disp: 180 tablet, Rfl: 1  Allergies  Allergen Reactions  . Ace Inhibitors Swelling    Angioedema and rash 05/23/2018  . Levaquin [Levofloxacin In D5w] Swelling    Angioedema and Rash 05/23/2018    ROS   No other specific complaints in a complete review of systems (except as listed in HPI above).  Objective  Vitals:   04/02/19 1037  BP: 120/76  Pulse: 88  Resp: 14  Temp: 98.9 F (37.2 C)  TempSrc: Oral  SpO2: 98%  Weight: 226 lb 8 oz (102.7 kg)  Height: 6\' 1"  (1.854 m)    Body mass index is 29.88 kg/m.  Nursing Note and Vital Signs reviewed.  Physical Exam  Constitutional: Patient appears well-developed and well-nourished. No distress.  HENT: Head: Normocephalic and atraumatic. Cardiovascular: Normal rate Pulmonary/Chest: Effort normal ,  Neurological: alert and oriented, speech normal.  Psychiatric: Patient has a normal mood and affect. behavior is normal. Judgment and thought content normal.    Assessment & Plan  1. Essential hypertension, benign Well-controlled - losartan (COZAAR) 25 MG tablet; Take 1 tablet (25 mg total) by mouth daily.  Dispense: 90 tablet; Refill: 0  2. Dyslipidemia Continue tricor and vascepa, patient will call endo to schedule follow-up  3. Impaired fasting glucose Discussed diet  - metFORMIN (GLUCOPHAGE-XR) 500 MG 24 hr tablet; Take 2 tablets (1,000 mg total) by mouth daily.  Dispense: 180 tablet; Refill: 0  4. GAD (generalized  anxiety disorder) Increasing, will increase dose. Denies panic attacks.  - escitalopram (LEXAPRO) 20 MG tablet; Take 1 tablet (20 mg total) by mouth daily.  Dispense: 90 tablet; Refill: 0     Follow Up Instructions: 3 months   I discussed the assessment and treatment plan with the patient. The patient was provided an opportunity to ask questions and all were answered. The patient agreed with the plan and demonstrated an understanding of the instructions.   The patient was advised to call back or seek an in-person evaluation if the symptoms worsen or if the condition fails to improve as anticipated.  I provided 12 minutes of non-face-to-face time during this  encounter.   Fredderick Severance, NP

## 2019-04-26 ENCOUNTER — Other Ambulatory Visit: Payer: Self-pay | Admitting: Nurse Practitioner

## 2019-04-26 DIAGNOSIS — F411 Generalized anxiety disorder: Secondary | ICD-10-CM

## 2019-05-23 IMAGING — CR DG CHEST 2V
1 series · 2 of 2 positions shown · non-contrast
Comparison: 05/22/2018

CLINICAL DATA: Pneumonia, chest tightness, history hypertension

EXAM:
CHEST - 2 VIEW

[Series 1: dg chest 2 view · 0.14mm/px · 2 of 2 slices shown]
[im 1/2]
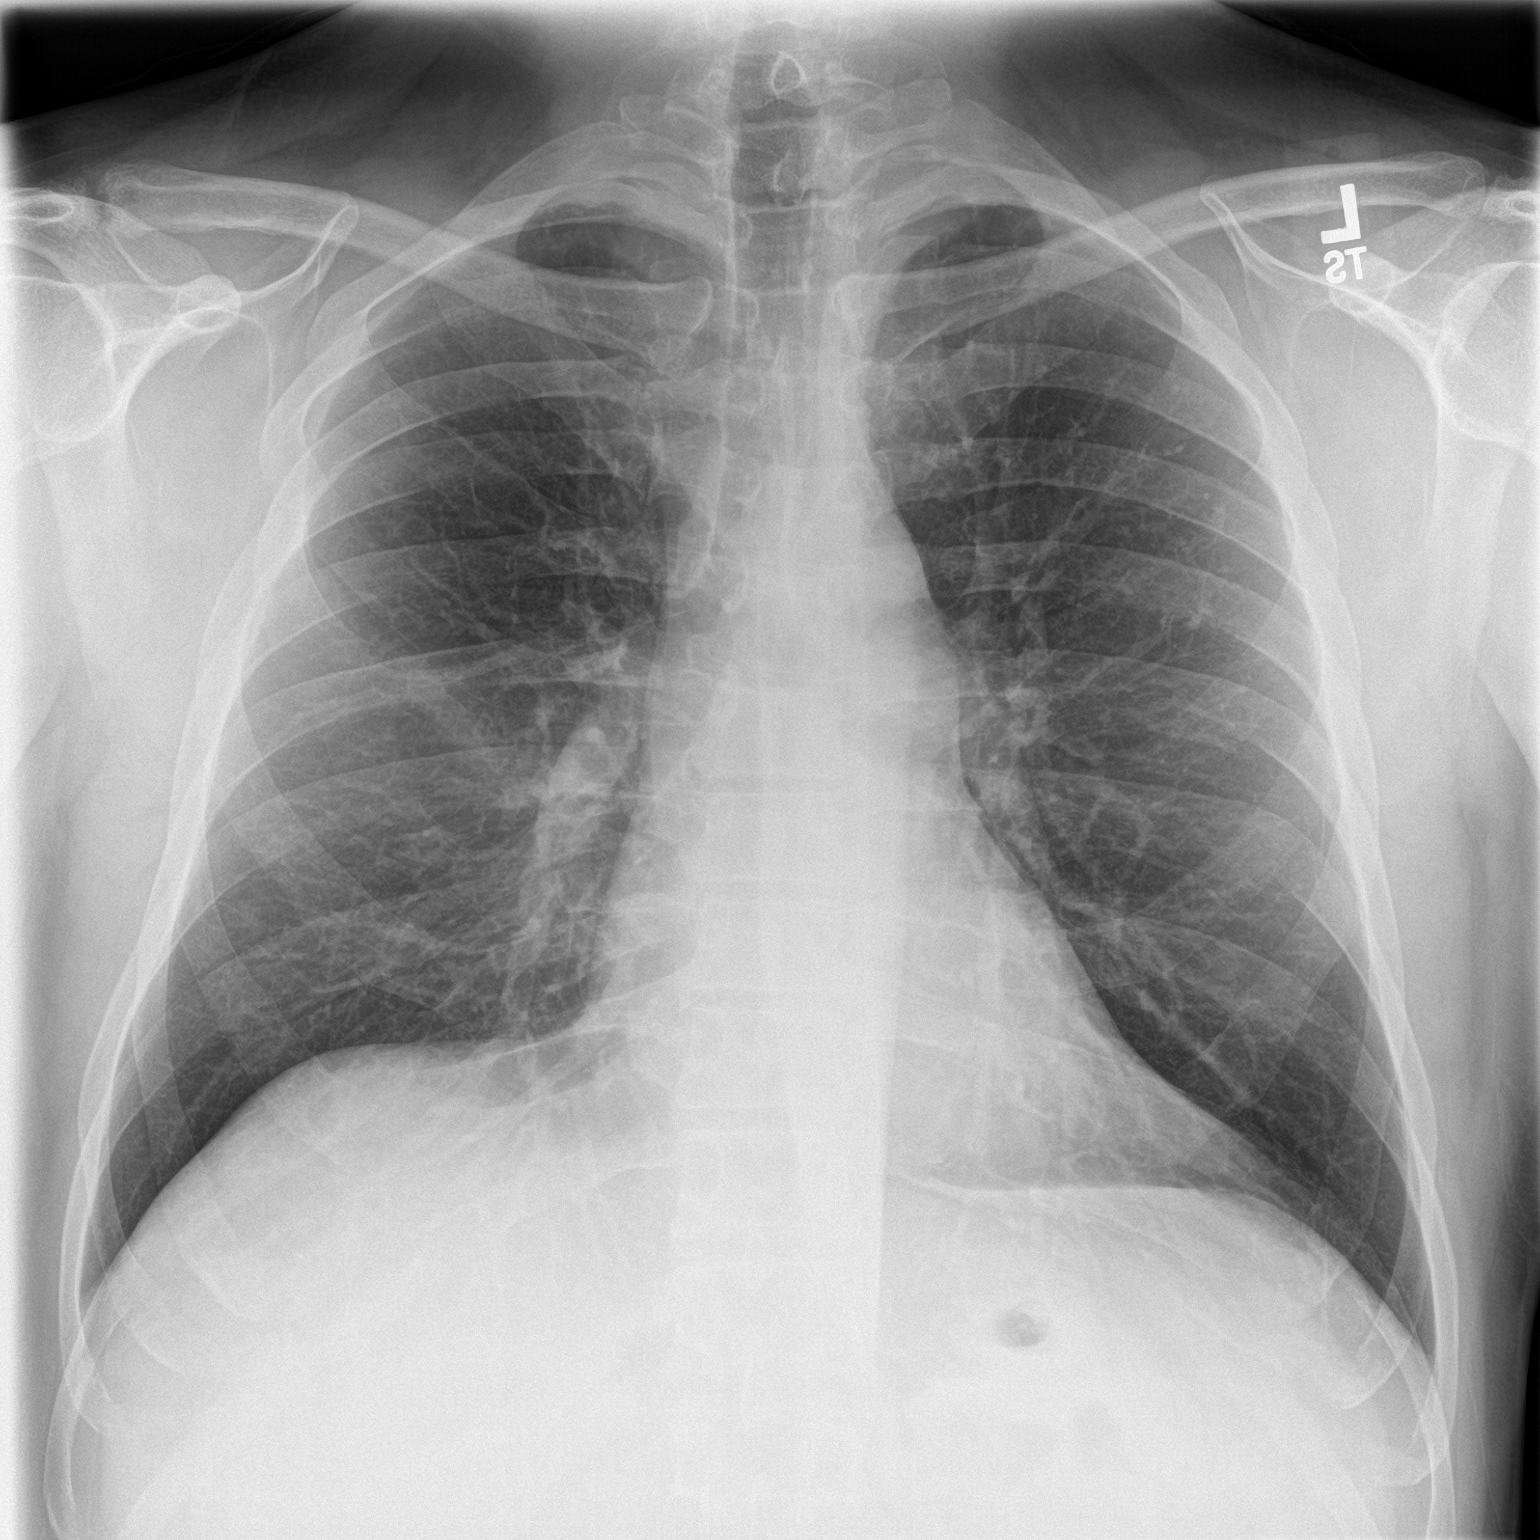
[im 2/2]
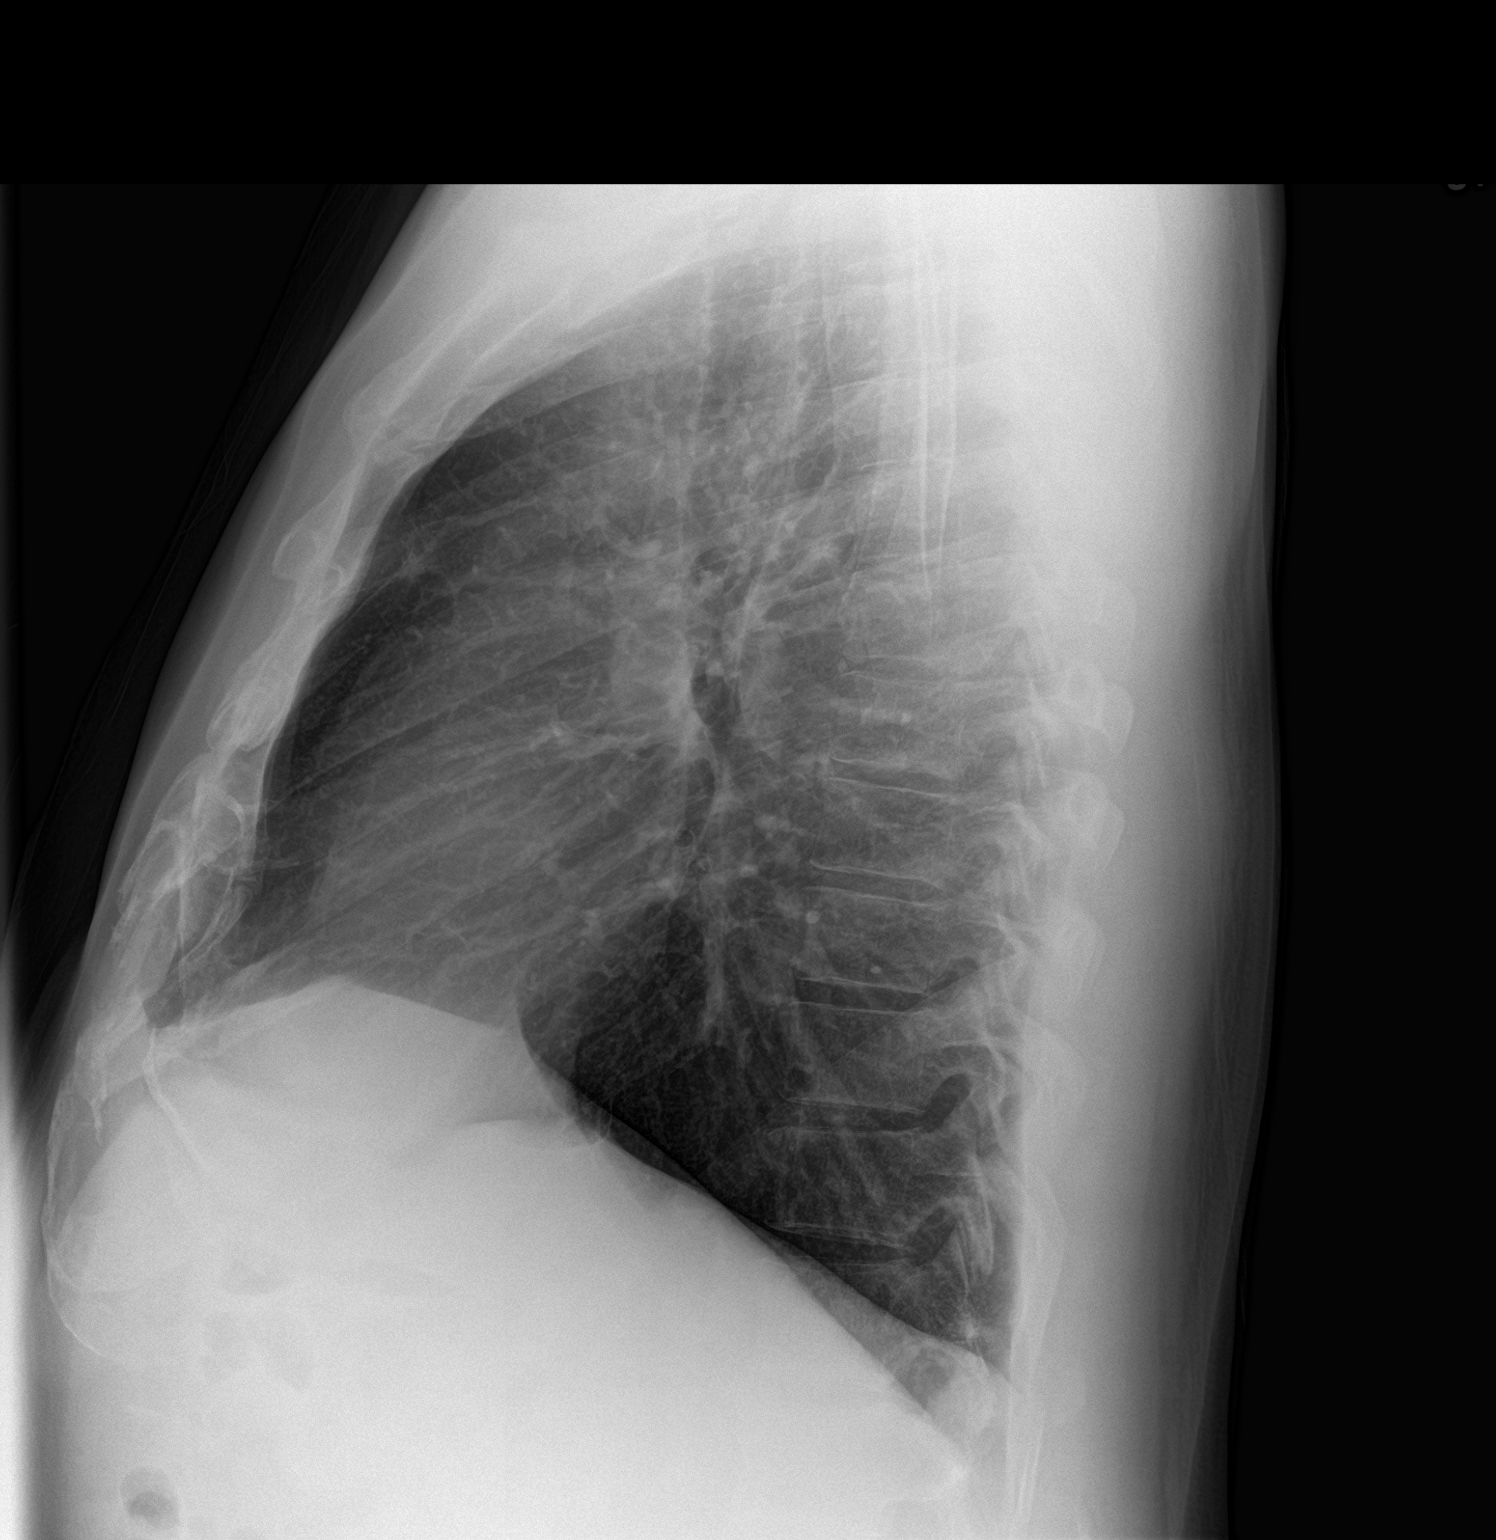

[2 of 2 positions shown; findings below may reference images not displayed]

FINDINGS: Normal heart size, mediastinal contours, and pulmonary vascularity.

Resolution of previously identified pulmonary infiltrates.

Lungs now appear clear.

No pleural effusion or pneumothorax.

Bones unremarkable.
IMPRESSION: Resolution of previously identified pulmonary infiltrates.

## 2019-05-31 ENCOUNTER — Other Ambulatory Visit: Payer: Self-pay | Admitting: Nurse Practitioner

## 2019-05-31 DIAGNOSIS — F411 Generalized anxiety disorder: Secondary | ICD-10-CM

## 2019-05-31 MED ORDER — ESCITALOPRAM OXALATE 20 MG PO TABS: 20.0000 mg | ORAL_TABLET | Freq: Every day | ORAL | 0 refills | Status: DC

## 2019-06-30 ENCOUNTER — Other Ambulatory Visit: Payer: Self-pay | Admitting: Nurse Practitioner

## 2019-06-30 DIAGNOSIS — F411 Generalized anxiety disorder: Secondary | ICD-10-CM

## 2019-07-02 ENCOUNTER — Ambulatory Visit: Payer: Managed Care, Other (non HMO) | Admitting: Family Medicine

## 2019-07-15 ENCOUNTER — Telehealth: Payer: Self-pay | Admitting: Nurse Practitioner

## 2019-07-15 DIAGNOSIS — F411 Generalized anxiety disorder: Secondary | ICD-10-CM

## 2019-07-16 ENCOUNTER — Other Ambulatory Visit: Payer: Self-pay | Admitting: Nurse Practitioner

## 2019-07-16 DIAGNOSIS — F411 Generalized anxiety disorder: Secondary | ICD-10-CM

## 2019-07-16 MED ORDER — ESCITALOPRAM OXALATE 20 MG PO TABS: 20.0000 mg | ORAL_TABLET | Freq: Every day | ORAL | 1 refills | Status: DC

## 2019-07-16 NOTE — Telephone Encounter (Signed)
Pt called and scheduled med follow up appointment.  Pt is completely out and wants to know if he can have enough called in to get him through until next appointment.

## 2019-07-16 NOTE — Telephone Encounter (Signed)
90 day supply of lexapro was sent in 07/16/19 by Benjamine Mola NP-C

## 2019-07-16 NOTE — Telephone Encounter (Signed)
Refill lexapro  Enough til appointment next week. Vascepa he will wait on due to it needing PA. Kristopher Oppenheim

## 2019-07-20 ENCOUNTER — Other Ambulatory Visit: Payer: Self-pay | Admitting: Nurse Practitioner

## 2019-07-20 ENCOUNTER — Other Ambulatory Visit: Payer: Self-pay

## 2019-07-20 ENCOUNTER — Encounter: Payer: Managed Care, Other (non HMO) | Admitting: Nurse Practitioner

## 2019-07-20 ENCOUNTER — Encounter: Payer: Self-pay | Admitting: Nurse Practitioner

## 2019-07-20 DIAGNOSIS — F411 Generalized anxiety disorder: Secondary | ICD-10-CM

## 2019-07-20 DIAGNOSIS — E785 Hyperlipidemia, unspecified: Secondary | ICD-10-CM

## 2019-07-20 DIAGNOSIS — I10 Essential (primary) hypertension: Secondary | ICD-10-CM

## 2019-07-20 DIAGNOSIS — E781 Pure hyperglyceridemia: Secondary | ICD-10-CM

## 2019-07-20 DIAGNOSIS — R7303 Prediabetes: Secondary | ICD-10-CM

## 2019-07-20 DIAGNOSIS — R7989 Other specified abnormal findings of blood chemistry: Secondary | ICD-10-CM

## 2019-07-20 NOTE — Progress Notes (Signed)
Attempted to contact patient at original appointment time at 11 am, then again at 12 53pm and 2:42 pm for second appointment time with success.

## 2019-08-21 ENCOUNTER — Encounter: Payer: Self-pay | Admitting: Family Medicine

## 2019-08-21 ENCOUNTER — Other Ambulatory Visit: Payer: Self-pay

## 2019-08-21 ENCOUNTER — Ambulatory Visit: Payer: Managed Care, Other (non HMO) | Admitting: Family Medicine

## 2019-08-21 VITALS — BP 122/80 | HR 100 | Temp 97.8°F | Resp 14 | Ht 73.0 in | Wt 227.3 lb

## 2019-08-21 DIAGNOSIS — I10 Essential (primary) hypertension: Secondary | ICD-10-CM

## 2019-08-21 DIAGNOSIS — E781 Pure hyperglyceridemia: Secondary | ICD-10-CM

## 2019-08-21 DIAGNOSIS — F411 Generalized anxiety disorder: Secondary | ICD-10-CM | POA: Diagnosis not present

## 2019-08-21 DIAGNOSIS — R61 Generalized hyperhidrosis: Secondary | ICD-10-CM | POA: Diagnosis not present

## 2019-08-21 DIAGNOSIS — R000 Tachycardia, unspecified: Secondary | ICD-10-CM | POA: Diagnosis not present

## 2019-08-21 DIAGNOSIS — R7303 Prediabetes: Secondary | ICD-10-CM

## 2019-08-21 HISTORY — DX: Pure hyperglyceridemia: E78.1

## 2019-08-21 MED ORDER — LOSARTAN POTASSIUM 25 MG PO TABS: 25.0000 mg | ORAL_TABLET | Freq: Every day | ORAL | 1 refills | Status: DC

## 2019-08-21 MED ORDER — ESCITALOPRAM OXALATE 20 MG PO TABS: 20.0000 mg | ORAL_TABLET | Freq: Every day | ORAL | 1 refills | Status: DC

## 2019-08-21 NOTE — Progress Notes (Signed)
Name: Jesus Barber   MRN: [id number]    DOB: July 18, 1966   Date:08/21/2019       Progress Note  Chief Complaint  Patient presents with  . Medication Refill     Subjective:   Jesus Barber is a 53 y.o. male, presents to clinic for routine follow up on the conditions listed above.  Med refills, also due for routine f/up for chronic conditions  Hyperlipidemia: Sees Dr. Gabriel Carina did labs a week ago and saw this morning Triglycerides very elevated 2050, has plan to d/c ETOH and recheck labs in 2 weeks On fenofibrates vascepa Current Medication Regimen: Last Lipids: Lab Results  Component Value Date   CHOL 282 (H) 01/01/2019   HDL 29 (L) 01/01/2019   [place]  01/01/2019     Comment:     . LDL cholesterol not calculated. Triglyceride levels greater than 400 mg/dL invalidate calculated LDL results. . Reference range: <100 . Desirable range <100 mg/dL for primary prevention;   <70 mg/dL for patients with CHD or diabetic patients  with > or = 2 CHD risk factors. Marland Kitchen LDL-C is now calculated using the Martin-Hopkins  calculation, which is a validated novel method providing  better accuracy than the Friedewald equation in the  estimation of LDL-C.  Cresenciano Genre et al. Annamaria Helling. [id number]): 2061-2068  (http://education.[facility].com/faq/FAQ164)    TRIG 1,740 (H) 01/01/2019   CHOLHDL 9.7 (H) 01/01/2019   - Denies: Chest pain, shortness of breath, myalgias.  prediabetes Prediabetes on metformin Pt notes good med compliance Pt has no SE from meds. No hypoglycemic episodes Denies: Polyuria, polydipsia, polyphagia, vision changes, or neuropathy  Recent pertinent labs: Lab Results  Component Value Date   HGBA1C 5.8 (H) 01/01/2019   HGBA1C 6.0 (H) 01/24/2018      Component Value Date/Time   NA 140 01/01/2019 1019   NA 145 (H) 08/12/2015 0926   K 4.7 01/01/2019 1019   CL 104 01/01/2019 1019   CO2 28 01/01/2019 1019   GLUCOSE 107 (H) 01/01/2019 1019   BUN 16  01/01/2019 1019   BUN 12 08/12/2015 0926   CREATININE 1.22 01/01/2019 1019   CALCIUM 10.0 01/01/2019 1019   PROT 7.0 01/01/2019 1019   PROT 7.0 12/02/2015 1012   ALBUMIN 3.8 05/22/2018 2144   ALBUMIN 4.8 12/02/2015 1012   AST 29 01/01/2019 1019   AST 46 (H) 01/13/2016 1011   ALT 41 01/01/2019 1019   ALT 51 (H) 01/13/2016 1011   ALKPHOS 109 05/22/2018 2144   BILITOT 0.5 01/01/2019 1019   BILITOT 0.6 12/02/2015 1012   GFRNONAA 68 01/01/2019 1019   GFRAA 79 01/01/2019 1019    Hx of GAD in chart - he requests refill on lexapro 20 mg and states main issue is aggitation and trouble with his mouth -stupid people really aggravate him and without his medication he cannot appropriately bite his tongue  GAD 7 : Generalized Anxiety Score 08/21/2019 07/20/2019 01/01/2019  Nervous, Anxious, on Edge 0 0 0  Control/stop worrying 0 0 0  Worry too much - different things 0 0 0  Trouble relaxing 0 0 0  Restless 0 0 0  Easily annoyed or irritable 1 3 0  Afraid - awful might happen 0 0 0  Total GAD 7 Score 1 3 0  Anxiety Difficulty Not difficult at all Somewhat difficult Not difficult at all   Went downs to 10 mg before but it wasn't as helpful.  He has no concerns or side effects of  the medication so would like to continue the same dose today.  Patient is also noted to be tachycardic here and rechecking his heart rate it continued to be mildly tachycardic.  He denies any anxiety, pain.  He has not had any infection or fever.  He does note history of profuse sweating that he does and is done for most of his life.  He denies any headaches, chest pain, shortness of breath, near syncopal episodes, palpitations, lower extremity edema.   Patient Active Problem List   Diagnosis Date Noted  . Non-traumatic rhabdomyolysis 05/27/2018  . Obesity (BMI 30.0-34.9) 01/24/2018  . Prediabetes 01/24/2018  . Tinnitus, bilateral 01/24/2018  . Medication monitoring encounter 02/22/2017  . Hypertriglyceridemia  07/21/2016  . Mild alcohol use disorder 12/04/2015  . Low serum testosterone level 08/14/2015  . Dyslipidemia 08/12/2015  . Essential hypertension, benign 08/12/2015  . Renal insufficiency 08/12/2015  . Elevated serum glutamic pyruvic transaminase (SGPT) level 08/12/2015    Past Surgical History:  Procedure Laterality Date  . BACK SURGERY     Discectomy    Family History  Problem Relation Age of Onset  . Cancer Father        lung  . Alzheimer's disease Father   . Hyperlipidemia Paternal Grandmother     Social History   Socioeconomic History  . Marital status: Married    Spouse name: Roland Rack  . Number of children: 1  . Years of education: Not on file  . Highest education level: High school graduate  Occupational History  . Not on file  Social Needs  . Financial resource strain: Not hard at all  . Food insecurity    Worry: Never true    Inability: Never true  . Transportation needs    Medical: No    Non-medical: No  Tobacco Use  . Smoking status: Former Smoker    Years: 25.00    Types: Cigarettes    Quit date: 11/20/2003    Years since quitting: 15.7  . Smokeless tobacco: Current User  Substance and Sexual Activity  . Alcohol use: Yes    Comment: on occasion   . Drug use: No  . Sexual activity: Yes    Partners: Female  Lifestyle  . Physical activity    Days per week: 0 days    Minutes per session: 0 min  . Stress: Not at all  Relationships  . Social connections    Talks on phone: More than three times a week    Gets together: Once a week    Attends religious service: More than 4 times per year    Active member of club or organization: No    Attends meetings of clubs or organizations: Never    Relationship status: Married  . Intimate partner violence    Fear of current or ex partner: No    Emotionally abused: No    Physically abused: No    Forced sexual activity: No  Other Topics Concern  . Not on file  Social History Narrative  . Not on file      Current Outpatient Medications:  .  escitalopram (LEXAPRO) 20 MG tablet, Take 1 tablet (20 mg total) by mouth daily., Disp: 90 tablet, Rfl: 1 .  fenofibrate (TRICOR) 145 MG tablet, Take 145 mg by mouth daily., Disp: , Rfl:  .  Icosapent Ethyl (VASCEPA) 1 g CAPS, Take 2 capsules (2 g total) by mouth 2 (two) times daily., Disp: 360 capsule, Rfl: 1 .  losartan (COZAAR) 25  MG tablet, Take 1 tablet (25 mg total) by mouth daily., Disp: 90 tablet, Rfl: 0 .  metFORMIN (GLUCOPHAGE-XR) 500 MG 24 hr tablet, Take 2 tablets (1,000 mg total) by mouth daily., Disp: 180 tablet, Rfl: 0  Allergies  Allergen Reactions  . Ace Inhibitors Swelling    Angioedema and rash 05/23/2018  . Statins Other (See Comments)    rhabdomyolysis  . Levaquin [Levofloxacin In D5w] Swelling    Angioedema and Rash 05/23/2018    I personally reviewed active problem list, medication list, allergies, family history, social history, health maintenance, notes from last encounter, lab results with the patient/caregiver today.  Review of Systems  Constitutional: Negative.   HENT: Negative.   Eyes: Negative.   Respiratory: Negative.   Cardiovascular: Negative.   Gastrointestinal: Negative.   Endocrine: Negative.   Genitourinary: Negative.   Musculoskeletal: Negative.   Skin: Negative.   Allergic/Immunologic: Negative.   Neurological: Negative.   Hematological: Negative.   Psychiatric/Behavioral: Negative.   All other systems reviewed and are negative.    Objective:    Vitals:   08/21/19 1012  BP: 122/80  Pulse: (!) 108  Resp: 14  Temp: 97.8 F (36.6 C)  SpO2: 99%  Weight: 227 lb 4.8 oz (103.1 kg)  Height: 6\' 1"  (1.854 m)    Body mass index is 29.99 kg/m.  Physical Exam Vitals signs and nursing note reviewed.  Constitutional:      General: He is not in acute distress.    Appearance: Normal appearance. He is well-developed. He is diaphoretic. He is not ill-appearing or toxic-appearing.     Interventions:  Face mask in place.  HENT:     Head: Normocephalic and atraumatic.     Jaw: No trismus.     Right Ear: External ear normal.     Left Ear: External ear normal.     Nose: Nose normal.     Mouth/Throat:     Mouth: Mucous membranes are moist.  Eyes:     General: Lids are normal. No scleral icterus.    Conjunctiva/sclera: Conjunctivae normal.     Pupils: Pupils are equal, round, and reactive to light.  Neck:     Musculoskeletal: Normal range of motion and neck supple.     Trachea: Trachea and phonation normal. No tracheal deviation.  Cardiovascular:     Rate and Rhythm: Regular rhythm. Tachycardia present.     Pulses: Normal pulses.          Radial pulses are 2+ on the right side and 2+ on the left side.       Posterior tibial pulses are 2+ on the right side and 2+ on the left side.     Heart sounds: Normal heart sounds. No murmur. No friction rub. No gallop.   Pulmonary:     Effort: Pulmonary effort is normal. No respiratory distress.     Breath sounds: Normal breath sounds. No stridor. No wheezing, rhonchi or rales.  Abdominal:     General: Bowel sounds are normal. There is no distension.     Palpations: Abdomen is soft.     Tenderness: There is no abdominal tenderness. There is no guarding or rebound.  Musculoskeletal: Normal range of motion.     Right lower leg: No edema.     Left lower leg: No edema.  Skin:    General: Skin is warm.     Capillary Refill: Capillary refill takes less than 2 seconds.     Coloration: Skin is not jaundiced  or pale.     Findings: No rash.     Nails: There is no clubbing.   Neurological:     Mental Status: He is alert.     Cranial Nerves: No dysarthria or facial asymmetry.     Motor: No tremor or abnormal muscle tone.     Gait: Gait normal.  Psychiatric:        Mood and Affect: Mood normal.        Speech: Speech normal.        Behavior: Behavior normal. Behavior is cooperative.     ECG interpretation   Date: 08/21/19  Rate: 102  Rhythm:  sinus tachycardia  QRS Axis: left axis deviation with LAFB  Intervals: normal  ST/T Wave abnormalities: normal -no ST elevation or depression, no T wave disturbances  Conduction Disutrbances: none  Old EKG Reviewed:  No significant changes noted from 05/23/2018 EKG (only PAC on 2019 ekg)    PHQ2/9: Depression screen [facility] 2/9 08/21/2019 07/20/2019 04/02/2019 01/01/2019 06/10/2018  Decreased Interest 0 0 0 0 0  Down, Depressed, Hopeless 0 0 0 0 0  PHQ - 2 Score 0 0 0 0 0  Altered sleeping 0 0 0 - 0  Tired, decreased energy 0 0 0 - 0  Change in appetite 0 0 0 - 0  Feeling bad or failure about yourself  0 0 0 - 0  Trouble concentrating 0 0 0 - 0  Moving slowly or fidgety/restless 0 0 0 - 0  Suicidal thoughts 0 0 0 - 0  PHQ-9 Score 0 0 0 - 0  Difficult doing work/chores Not difficult at all Not difficult at all Not difficult at all - Not difficult at all    phq 9 is negative Reviewed today  Fall Risk: Fall Risk  08/21/2019 07/20/2019 04/02/2019 01/01/2019 06/10/2018  Falls in the past year? 0 0 0 0 No  Number falls in past yr: 0 0 - 0 -  Injury with Fall? 0 0 - 0 -  Comment - - - - -      Functional Status Survey: Is the patient deaf or have difficulty hearing?: No Does the patient have difficulty seeing, even when wearing glasses/contacts?: No Does the patient have difficulty concentrating, remembering, or making decisions?: No Does the patient have difficulty walking or climbing stairs?: No Does the patient have difficulty dressing or bathing?: No Does the patient have difficulty doing errands alone such as visiting a doctor's office or shopping?: No    Assessment & Plan:       ICD-10-CM   1. GAD (generalized anxiety disorder)  F41.1 escitalopram (LEXAPRO) 20 MG tablet   refill on lexapro, well controlled  2. Essential hypertension, benign  I10 losartan (COZAAR) 25 MG tablet    TSH   BP well controlled with current meds  3. Tachycardia  0000000 BASIC METABOLIC PANEL WITH GFR     TSH    EKG 12-Lead   sinus tachycardia - unclear etiology, may be due to being out of anxiety meds for a few days, concerning Sx/ER precautions reviewed, pt verbalizes understanding Checking renal function, electrolytes and thyroid Other labs done and reviewed per endocrine at [facility] everywhere  4. Diaphoresis  [id number] BASIC METABOLIC PANEL WITH GFR    TSH   lifelong -does not seem concerning to me for any abnormality or rare diagnosis like a pheochromocytoma -seeing endocrinology encouraged him to follow-up  5. Hypertriglyceridemia  E78.1    Managed by endocrine  6. Prediabetes  R73.03    Managed by endocrine, doing well with metformin       Delsa Grana, PA-C 08/21/19 10:35 AM

## 2019-08-22 LAB — BASIC METABOLIC PANEL WITH GFR
BUN: 17 mg/dL (ref 7–25)
CO2: 27 mmol/L (ref 20–32)
Calcium: 10.2 mg/dL (ref 8.6–10.3)
Chloride: 103 mmol/L (ref 98–110)
Creat: 1.23 mg/dL (ref 0.70–1.33)
GFR, Est African American: 78 mL/min/{1.73_m2} (ref >=60)
GFR, Est Non African American: 67 mL/min/{1.73_m2} (ref >=60)
Glucose, Bld: 141 mg/dL — ABNORMAL HIGH (ref 65–99)
Potassium: 4.2 mmol/L (ref 3.5–5.3)
Sodium: 139 mmol/L (ref 135–146)

## 2019-08-22 LAB — TSH: TSH: 0.91 mIU/L (ref 0.40–4.50)

## 2019-08-24 ENCOUNTER — Other Ambulatory Visit: Payer: Self-pay

## 2020-02-01 ENCOUNTER — Other Ambulatory Visit: Payer: Self-pay | Admitting: Family Medicine

## 2020-02-01 DIAGNOSIS — F411 Generalized anxiety disorder: Secondary | ICD-10-CM

## 2020-02-01 DIAGNOSIS — I10 Essential (primary) hypertension: Secondary | ICD-10-CM

## 2020-02-01 NOTE — Telephone Encounter (Signed)
Hypertension medication request:  Last office visit pertaining to hypertension:09/08/19  BP Readings from Last 3 Encounters:  08/21/19 122/80  04/02/19 120/76  01/01/19 122/78    Lab Results  Component Value Date   CREATININE 1.23 08/21/2019   BUN 17 08/21/2019   NA 139 08/21/2019   K 4.2 08/21/2019   CL 103 08/21/2019   CO2 27 08/21/2019     No follow-ups on file.

## 2021-02-02 ENCOUNTER — Other Ambulatory Visit: Payer: Self-pay | Admitting: Family Medicine

## 2021-02-02 DIAGNOSIS — F411 Generalized anxiety disorder: Secondary | ICD-10-CM

## 2021-02-02 MED ORDER — ESCITALOPRAM OXALATE 20 MG PO TABS: 20.0000 mg | ORAL_TABLET | Freq: Every day | ORAL | 0 refills | Status: DC

## 2021-02-02 NOTE — Telephone Encounter (Signed)
Medication Refill - Medication: Escitalopram   Has the patient contacted their pharmacy? No. Pt states that he is out of refills. Please advise.  (Agent: If no, request that the patient contact the pharmacy for the refill.) (Agent: If yes, when and what did the pharmacy advise?)  Preferred Pharmacy (with phone number or street name):  [place], [facility]  [address] [place] [address]  Phone: [phone] Fax: [phone]  Hours: Not open 24 hours     Agent: Please be advised that RX refills may take up to 3 business days. We ask that you follow-up with your pharmacy.

## 2021-02-02 NOTE — Telephone Encounter (Signed)
Copied from [place] [phone]. Topic: General - Other >> Feb 02, 2021  1:57 PM Mcneil, Ja-Kwan wrote: Reason for CRM: Pt returned call and stated he recently started a new job and he is in the 3 month timeframe where he does not have any insurance. Pt stated he can not afford to come in for an appt without insurance so he will just do without the medication for now.

## 2021-02-02 NOTE — Telephone Encounter (Signed)
Requested medication (s) are due for refill today:  no  Requested medication (s) are on the active medication list: yes   Future visit scheduled:  no  Notes to clinic:  patient is overdue for follow up appointment  Left message for patient to call and schedule appt     Requested Prescriptions  Pending Prescriptions Disp Refills   escitalopram (LEXAPRO) 20 MG tablet 90 tablet 3    Sig: Take 1 tablet (20 mg total) by mouth daily.      Psychiatry:  Antidepressants - SSRI Failed - 02/02/2021  1:52 PM      Failed - Valid encounter within last 6 months    Recent Outpatient Visits           1 year ago GAD (generalized anxiety disorder)   [facility] Delsa Grana, PA-C   1 year ago Essential hypertension, benign   [place], NP   2 years ago Essential hypertension, benign   [place], NP   2 years ago Non-traumatic rhabdomyolysis   [place], FNP   2 years ago Non-traumatic rhabdomyolysis   [place], Astrid Divine, [place]

## 2021-03-22 ENCOUNTER — Other Ambulatory Visit: Payer: Self-pay | Admitting: Family Medicine

## 2021-03-22 DIAGNOSIS — I10 Essential (primary) hypertension: Secondary | ICD-10-CM

## 2021-03-22 MED ORDER — LOSARTAN POTASSIUM 25 MG PO TABS: 25.0000 mg | ORAL_TABLET | Freq: Every day | ORAL | 0 refills | Status: DC

## 2021-03-22 NOTE — Telephone Encounter (Signed)
Copied from [place] [phone]. Topic: Quick Communication - Rx Refill/Question >> Mar 22, 2021 12:24 PM Leward Quan A wrote: Medication: losartan (COZAAR) 25 MG tablet Per patient just got insurance need refill till visit in june  Has the patient contacted their pharmacy? Yes.   (Agent: If no, request that the patient contact the pharmacy for the refill.) (Agent: If yes, when and what did the pharmacy advise?)  Preferred Pharmacy (with phone number or street name): [place], [place]  Phone:  [phone] Fax:  [phone]     Agent: Please be advised that RX refills may take up to 3 business days. We ask that you follow-up with your pharmacy.

## 2021-03-22 NOTE — Telephone Encounter (Signed)
Requested Prescriptions  Pending Prescriptions Disp Refills  . losartan (COZAAR) 25 MG tablet 30 tablet 0    Sig: Take 1 tablet (25 mg total) by mouth daily.     Cardiovascular:  Angiotensin Receptor Blockers Failed - 03/22/2021 12:31 PM      Failed - Cr in normal range and within 180 days    Creat  Date Value Ref Range Status  08/21/2019 1.23 0.70 - 1.33 mg/dL Final    Comment:    For patients >54 years of age, the reference limit for Creatinine is approximately 13% higher for people identified as African-American. .          Failed - K in normal range and within 180 days    Potassium  Date Value Ref Range Status  08/21/2019 4.2 3.5 - 5.3 mmol/L Final         Failed - Valid encounter within last 6 months    Recent Outpatient Visits          1 year ago GAD (generalized anxiety disorder)   [facility] Delsa Grana, PA-C   1 year ago Essential hypertension, benign   [place], NP   2 years ago Essential hypertension, benign   [facility] Fredderick Severance, NP   2 years ago Non-traumatic rhabdomyolysis   [place], [place]   2 years ago Non-traumatic rhabdomyolysis   [place], [place], [place]      Future Appointments            In 1 month Delsa Grana, PA-C [facility], [place] - Patient is not pregnant      Passed - Last BP in normal range    BP Readings from Last 1 Encounters:  08/21/19 122/80

## 2021-04-21 ENCOUNTER — Encounter: Payer: Managed Care, Other (non HMO) | Admitting: Family Medicine

## 2021-04-28 ENCOUNTER — Encounter: Payer: Managed Care, Other (non HMO) | Admitting: Family Medicine

## 2021-05-04 ENCOUNTER — Ambulatory Visit (INDEPENDENT_AMBULATORY_CARE_PROVIDER_SITE_OTHER): Payer: BC Managed Care – PPO | Admitting: Unknown Physician Specialty

## 2021-05-04 ENCOUNTER — Other Ambulatory Visit: Payer: Self-pay

## 2021-05-04 ENCOUNTER — Encounter: Payer: Self-pay | Admitting: Unknown Physician Specialty

## 2021-05-04 VITALS — BP 142/96 | HR 86 | Temp 98.0°F | Resp 18 | Ht 73.0 in | Wt 227.6 lb

## 2021-05-04 DIAGNOSIS — E785 Hyperlipidemia, unspecified: Secondary | ICD-10-CM | POA: Diagnosis not present

## 2021-05-04 DIAGNOSIS — Z0000 Encounter for general adult medical examination without abnormal findings: Secondary | ICD-10-CM | POA: Diagnosis not present

## 2021-05-04 DIAGNOSIS — E781 Pure hyperglyceridemia: Secondary | ICD-10-CM

## 2021-05-04 DIAGNOSIS — F411 Generalized anxiety disorder: Secondary | ICD-10-CM

## 2021-05-04 DIAGNOSIS — Z1211 Encounter for screening for malignant neoplasm of colon: Secondary | ICD-10-CM

## 2021-05-04 DIAGNOSIS — I10 Essential (primary) hypertension: Secondary | ICD-10-CM | POA: Diagnosis not present

## 2021-05-04 MED ORDER — LOSARTAN POTASSIUM 25 MG PO TABS: 25.0000 mg | ORAL_TABLET | Freq: Every day | ORAL | 3 refills | Status: DC

## 2021-05-04 MED ORDER — ESCITALOPRAM OXALATE 20 MG PO TABS: 20.0000 mg | ORAL_TABLET | Freq: Every day | ORAL | 3 refills | Status: DC

## 2021-05-04 NOTE — Assessment & Plan Note (Signed)
Elevated but out of meds.  Restart.  Will monitor at home

## 2021-05-04 NOTE — Progress Notes (Signed)
BP (!) 142/96 (BP Location: Right Arm, Patient Position: Sitting)   Pulse 86   Temp 98 F (36.7 C) (Oral)   Resp 18   Ht 6\' 1"  (1.854 m)   Wt 227 lb 9.6 oz (103.2 kg)   SpO2 96%   BMI 30.03 kg/m    Subjective:    Patient ID: Jesus Barber, male    DOB: Feb 03, 1966, 55 y.o.   MRN: [id number]  HPI: Jesus Barber is a 55 y.o. male  Chief Complaint  Patient presents with   Annual Exam   Medication Refill   Diabetes: Managed by endocrinology and has been stable  Hypertension  Out of meds for 2 weeks Average home BPs: Not checking   Using medication without problems or lightheadedness No chest pain with exertion or shortness of breath No Edema  Elevated Cholesterol Using medications without problems No Muscle aches  Diet: Exercise: Drives a truck.  Not the best diet.    Depression:  Doing well on current medication.    Depression screen [facility] 2/9 05/04/2021 08/21/2019 07/20/2019 04/02/2019 01/01/2019  Decreased Interest 0 0 0 0 0  Down, Depressed, Hopeless 0 0 0 0 0  PHQ - 2 Score 0 0 0 0 0  Altered sleeping 1 0 0 0 -  Tired, decreased energy 1 0 0 0 -  Change in appetite 0 0 0 0 -  Feeling bad or failure about yourself  0 0 0 0 -  Trouble concentrating 0 0 0 0 -  Moving slowly or fidgety/restless 0 0 0 0 -  Suicidal thoughts 0 0 0 0 -  PHQ-9 Score 2 0 0 0 -  Difficult doing work/chores Not difficult at all Not difficult at all Not difficult at all Not difficult at all -    Relevant past medical, surgical, family and social history reviewed and updated as indicated. Interim medical history since our last visit reviewed. Allergies and medications reviewed and updated.  Review of Systems  Per HPI unless specifically indicated above     Objective:    BP (!) 142/96 (BP Location: Right Arm, Patient Position: Sitting)   Pulse 86   Temp 98 F (36.7 C) (Oral)   Resp 18   Ht 6\' 1"  (1.854 m)   Wt 227 lb 9.6 oz (103.2 kg)   SpO2 96%   BMI 30.03 kg/m    Wt Readings from Last 3 Encounters:  05/04/21 227 lb 9.6 oz (103.2 kg)  08/21/19 227 lb 4.8 oz (103.1 kg)  04/02/19 226 lb 8 oz (102.7 kg)    Physical Exam Constitutional:      Appearance: He is well-developed.  HENT:     Head: Normocephalic.     Right Ear: Tympanic membrane, ear canal and external ear normal.     Left Ear: Tympanic membrane, ear canal and external ear normal.     Mouth/Throat:     Pharynx: Uvula midline.  Eyes:     Pupils: Pupils are equal, round, and reactive to light.  Cardiovascular:     Rate and Rhythm: Normal rate and regular rhythm.     Heart sounds: Normal heart sounds. No murmur heard.   No friction rub. No gallop.  Pulmonary:     Effort: Pulmonary effort is normal. No respiratory distress.     Breath sounds: Normal breath sounds.  Abdominal:     General: Bowel sounds are normal. There is no distension.     Palpations: Abdomen is soft.  Tenderness: There is no abdominal tenderness.  Genitourinary:    Prostate: Normal.     Rectum: Normal.  Musculoskeletal:        General: Normal range of motion.  Skin:    General: Skin is warm and dry.  Neurological:     Mental Status: He is alert and oriented to person, place, and time.     Deep Tendon Reflexes: Reflexes are normal and symmetric.  Psychiatric:        Behavior: Behavior normal.        Thought Content: Thought content normal.        Judgment: Judgment normal.    Results for orders placed or performed in visit on 82/50/03  BASIC METABOLIC PANEL WITH GFR  Result Value Ref Range   Glucose, Bld 141 (H) 65 - 99 mg/dL   BUN 17 7 - 25 mg/dL   Creat 1.23 0.70 - 1.33 mg/dL   GFR, Est Non African American 67 > OR = 60 mL/min/1.109m2   GFR, Est African American 78 > OR = 60 mL/min/1.9m2   BUN/Creatinine Ratio NOT APPLICABLE 6 - 22 (calc)   Sodium 139 135 - 146 mmol/L   Potassium 4.2 3.5 - 5.3 mmol/L   Chloride 103 98 - 110 mmol/L   CO2 27 20 - 32 mmol/L   Calcium 10.2 8.6 - 10.3 mg/dL  TSH   Result Value Ref Range   TSH 0.91 0.40 - 4.50 mIU/L      Assessment & Plan:   Problem List Items Addressed This Visit       Unprioritized   Dyslipidemia (Chronic)   Relevant Orders   Comprehensive metabolic panel   Essential hypertension, benign (Chronic)    Elevated but out of meds.  Restart.  Will monitor at home       Relevant Medications   losartan (COZAAR) 25 MG tablet   Other Relevant Orders   Comprehensive metabolic panel   Hypertriglyceridemia - Primary (Chronic)   Relevant Medications   losartan (COZAAR) 25 MG tablet   Other Visit Diagnoses     Routine general medical examination at a health care facility       Relevant Orders   CBC with Differential/Platelet   Comprehensive metabolic panel   Hepatitis C antibody   HIV Antibody (routine testing w rflx)   Lipid panel   TSH   Colon cancer screening       Relevant Orders   Ambulatory referral to Gastroenterology   GAD (generalized anxiety disorder)       refill on lexapro, well controlled   Relevant Medications   escitalopram (LEXAPRO) 20 MG tablet       HM:  Refer for colonoscopy Shingles vaccines Covid vaccine hesitant Informed decision making for PSA  Follow up plan: Recheck 6 months

## 2021-05-05 ENCOUNTER — Other Ambulatory Visit: Payer: Self-pay | Admitting: Unknown Physician Specialty

## 2021-05-05 DIAGNOSIS — E781 Pure hyperglyceridemia: Secondary | ICD-10-CM

## 2021-05-05 LAB — CBC WITH DIFFERENTIAL/PLATELET
Absolute Monocytes: 503 cells/uL (ref 200–950)
Basophils Absolute: 19 cells/uL (ref 0–200)
Basophils Relative: 0.4 %
Eosinophils Absolute: 61 cells/uL (ref 15–500)
Eosinophils Relative: 1.3 %
HCT: 41.3 % (ref 38.5–50.0)
Hemoglobin: 14.5 g/dL (ref 13.2–17.1)
Lymphs Abs: 1213 cells/uL (ref 850–3900)
MCH: 31 pg (ref 27.0–33.0)
MCHC: 35.1 g/dL (ref 32.0–36.0)
MCV: 88.2 fL (ref 80.0–100.0)
MPV: 10.7 fL (ref 7.5–12.5)
Monocytes Relative: 10.7 %
Neutro Abs: 2905 cells/uL (ref 1500–7800)
Neutrophils Relative %: 61.8 %
Platelets: 215 10*3/uL (ref 140–400)
RBC: 4.68 10*6/uL (ref 4.20–5.80)
RDW: 12 % (ref 11.0–15.0)
Total Lymphocyte: 25.8 %
WBC: 4.7 10*3/uL (ref 3.8–10.8)

## 2021-05-05 LAB — LIPID PANEL
Cholesterol: 217 mg/dL — ABNORMAL HIGH (ref <200)
HDL: 30 mg/dL — ABNORMAL LOW (ref >=40)
Non-HDL Cholesterol (Calc): 187 mg/dL (calc) — ABNORMAL HIGH (ref <130)
Total CHOL/HDL Ratio: 7.2 (calc) — ABNORMAL HIGH (ref <5.0)
Triglycerides: 923 mg/dL — ABNORMAL HIGH (ref <150)

## 2021-05-05 LAB — COMPREHENSIVE METABOLIC PANEL
AG Ratio: 2.4 (calc) (ref 1.0–2.5)
ALT: 37 U/L (ref 9–46)
AST: 30 U/L (ref 10–35)
Albumin: 4.8 g/dL (ref 3.6–5.1)
Alkaline phosphatase (APISO): 69 U/L (ref 35–144)
BUN: 13 mg/dL (ref 7–25)
CO2: 22 mmol/L (ref 20–32)
Calcium: 9.4 mg/dL (ref 8.6–10.3)
Chloride: 104 mmol/L (ref 98–110)
Creat: 1.13 mg/dL (ref 0.70–1.33)
Globulin: 2 g/dL (calc) (ref 1.9–3.7)
Glucose, Bld: 90 mg/dL (ref 65–139)
Potassium: 3.9 mmol/L (ref 3.5–5.3)
Sodium: 137 mmol/L (ref 135–146)
Total Bilirubin: 0.4 mg/dL (ref 0.2–1.2)
Total Protein: 6.8 g/dL (ref 6.1–8.1)

## 2021-05-05 LAB — HEPATITIS C ANTIBODY
Hepatitis C Ab: NONREACTIVE
SIGNAL TO CUT-OFF: 0 (ref <1.00)

## 2021-05-05 LAB — TSH: TSH: 1.79 mIU/L (ref 0.40–4.50)

## 2021-05-05 LAB — HIV ANTIBODY (ROUTINE TESTING W REFLEX): HIV 1&2 Ab, 4th Generation: NONREACTIVE

## 2021-11-03 ENCOUNTER — Ambulatory Visit: Payer: BC Managed Care – PPO | Admitting: Nurse Practitioner

## 2021-11-03 ENCOUNTER — Ambulatory Visit: Payer: Self-pay

## 2021-11-03 NOTE — Telephone Encounter (Signed)
Pt called, he states his wife went to pick up meds today and BP med w/o insurance was too expensive. He is asking if he can not take it until his appt in Feb 2023 d/t no insurance currently. Advised him that with his medical hx he needs to continue taking bp meds and several others. Explained that he can go on [facility] website and sign up and receive discount for meds until new insurance starts back. He states he will look that up when he gets home and get his meds refilled using that.   Summary: medication question   Pt stated he can not afford the cost ($700) for his blood pressure medication due to not having any insurance. Pt requests call back to discuss because he wants to know if he can go without taking it until his appt in February. Pt requests call back. Cb# [phone]      Reason for Disposition  Health Information question, no triage required and triager able to answer question  Protocols used: Information Only Call - No Triage-A-AH

## 2021-12-26 ENCOUNTER — Ambulatory Visit (INDEPENDENT_AMBULATORY_CARE_PROVIDER_SITE_OTHER): Payer: BC Managed Care – PPO | Admitting: Nurse Practitioner

## 2021-12-26 ENCOUNTER — Other Ambulatory Visit: Payer: Self-pay

## 2021-12-26 ENCOUNTER — Encounter: Payer: Self-pay | Admitting: Nurse Practitioner

## 2021-12-26 VITALS — BP 138/86 | HR 98 | Temp 98.3°F | Resp 18 | Ht 73.0 in | Wt 238.1 lb

## 2021-12-26 DIAGNOSIS — E785 Hyperlipidemia, unspecified: Secondary | ICD-10-CM

## 2021-12-26 DIAGNOSIS — F411 Generalized anxiety disorder: Secondary | ICD-10-CM | POA: Diagnosis not present

## 2021-12-26 DIAGNOSIS — Z1211 Encounter for screening for malignant neoplasm of colon: Secondary | ICD-10-CM

## 2021-12-26 DIAGNOSIS — I10 Essential (primary) hypertension: Secondary | ICD-10-CM | POA: Diagnosis not present

## 2021-12-26 DIAGNOSIS — Z23 Encounter for immunization: Secondary | ICD-10-CM

## 2021-12-26 MED ORDER — ESCITALOPRAM OXALATE 20 MG PO TABS: 20.0000 mg | ORAL_TABLET | Freq: Every day | ORAL | 3 refills | Status: DC

## 2021-12-26 MED ORDER — LOSARTAN POTASSIUM 25 MG PO TABS: 25.0000 mg | ORAL_TABLET | Freq: Every day | ORAL | 3 refills | Status: DC

## 2021-12-26 NOTE — Progress Notes (Signed)
BP 138/86    Pulse 98    Temp 98.3 F (36.8 C) (Oral)    Resp 18    Ht 6\' 1"  (2.671 m)    Wt 238 lb 1.6 oz (108 kg)    SpO2 97%    BMI 31.41 kg/m    Subjective:    Patient ID: Jesus Barber, male    DOB: Sep 10, 1966, 56 y.o.   MRN: [id number]  HPI: Jesus Barber is a 56 y.o. male, here alone  Chief Complaint  Patient presents with   Hypertension   Hyperlipidemia   Depression    6 month follow up   HTN: He says he does not check his blood pressure at home.  He denies any chest pain, shortness of breath, headache or blurred vision.  His blood pressure was 138/86 today. He is currently taking losartan 25 mg daily.   Hyperlipidemia: He is going to restart his vascepa today.  His last LDL was not calculated due to his triglycerides being so high, they were 923.  He is fasting today and will get labs.   GAD: He says he is not depressed or anxious it is more for anger management.  He says it helps. He is currently taking lexapro 20 mg daily.  Depression screen [facility] 2/9 12/26/2021 05/04/2021 08/21/2019 07/20/2019 04/02/2019  Decreased Interest 0 0 0 0 0  Down, Depressed, Hopeless 0 0 0 0 0  PHQ - 2 Score 0 0 0 0 0  Altered sleeping 0 1 0 0 0  Tired, decreased energy 0 1 0 0 0  Change in appetite 0 0 0 0 0  Feeling bad or failure about yourself  0 0 0 0 0  Trouble concentrating 0 0 0 0 0  Moving slowly or fidgety/restless 0 0 0 0 0  Suicidal thoughts 0 0 0 0 0  PHQ-9 Score 0 2 0 0 0  Difficult doing work/chores Not difficult at all Not difficult at all Not difficult at all Not difficult at all Not difficult at all  Some recent data might be hidden   GAD 7 : Generalized Anxiety Score 12/26/2021 05/04/2021 08/21/2019 07/20/2019  Nervous, Anxious, on Edge 0 0 0 0  Control/stop worrying 0 0 0 0  Worry too much - different things 0 0 0 0  Trouble relaxing 0 0 0 0  Restless 0 0 0 0  Easily annoyed or irritable 0 1 1 3   Afraid - awful might happen 0 0 0 0  Total GAD 7 Score 0 1 1 3    Anxiety Difficulty Not difficult at all Not difficult at all Not difficult at all Somewhat difficult     Relevant past medical, surgical, family and social history reviewed and updated as indicated. Interim medical history since our last visit reviewed. Allergies and medications reviewed and updated.  Review of Systems  Constitutional: Negative for fever or weight change.  Respiratory: Negative for cough and shortness of breath.   Cardiovascular: Negative for chest pain or palpitations.  Gastrointestinal: Negative for abdominal pain, no bowel changes.  Musculoskeletal: Negative for gait problem or joint swelling.  Skin: Negative for rash.  Neurological: Negative for dizziness or headache.  No other specific complaints in a complete review of systems (except as listed in HPI above).      Objective:    BP 138/86    Pulse 98    Temp 98.3 F (36.8 C) (Oral)    Resp 18    Ht  6\' 1"  (1.854 m)    Wt 238 lb 1.6 oz (108 kg)    SpO2 97%    BMI 31.41 kg/m   Wt Readings from Last 3 Encounters:  12/26/21 238 lb 1.6 oz (108 kg)  05/04/21 227 lb 9.6 oz (103.2 kg)  08/21/19 227 lb 4.8 oz (103.1 kg)    Physical Exam  Constitutional: Patient appears well-developed and well-nourished.  No distress.  HEENT: head atraumatic, normocephalic, pupils equal and reactive to light, neck supple Cardiovascular: Normal rate, regular rhythm and normal heart sounds.  No murmur heard. No BLE edema. Pulmonary/Chest: Effort normal and breath sounds normal. No respiratory distress. Abdominal: Soft.  There is no tenderness. Psychiatric: Patient has a normal mood and affect. behavior is normal. Judgment and thought content normal.   Results for orders placed or performed in visit on 05/04/21  CBC with Differential/Platelet  Result Value Ref Range   WBC 4.7 3.8 - 10.8 Thousand/uL   RBC 4.68 4.20 - 5.80 Million/uL   Hemoglobin 14.5 13.2 - 17.1 g/dL   HCT 41.3 38.5 - 50.0 %   MCV 88.2 80.0 - 100.0 fL   MCH 31.0  27.0 - 33.0 pg   MCHC 35.1 32.0 - 36.0 g/dL   RDW 12.0 11.0 - 15.0 %   Platelets 215 140 - 400 Thousand/uL   MPV 10.7 7.5 - 12.5 fL   Neutro Abs 2,905 1,500 - 7,800 cells/uL   Lymphs Abs 1,213 850 - 3,900 cells/uL   Absolute Monocytes 503 200 - 950 cells/uL   Eosinophils Absolute 61 15 - 500 cells/uL   Basophils Absolute 19 0 - 200 cells/uL   Neutrophils Relative % 61.8 %   Total Lymphocyte 25.8 %   Monocytes Relative 10.7 %   Eosinophils Relative 1.3 %   Basophils Relative 0.4 %  Comprehensive metabolic panel  Result Value Ref Range   Glucose, Bld 90 65 - 139 mg/dL   BUN 13 7 - 25 mg/dL   Creat 1.13 0.70 - 1.33 mg/dL   BUN/Creatinine Ratio NOT APPLICABLE 6 - 22 (calc)   Sodium 137 135 - 146 mmol/L   Potassium 3.9 3.5 - 5.3 mmol/L   Chloride 104 98 - 110 mmol/L   CO2 22 20 - 32 mmol/L   Calcium 9.4 8.6 - 10.3 mg/dL   Total Protein 6.8 6.1 - 8.1 g/dL   Albumin 4.8 3.6 - 5.1 g/dL   Globulin 2.0 1.9 - 3.7 g/dL (calc)   AG Ratio 2.4 1.0 - 2.5 (calc)   Total Bilirubin 0.4 0.2 - 1.2 mg/dL   Alkaline phosphatase (APISO) 69 35 - 144 U/L   AST 30 10 - 35 U/L   ALT 37 9 - 46 U/L  Hepatitis C antibody  Result Value Ref Range   Hepatitis C Ab NON-REACTIVE NON-REACTIVE   SIGNAL TO CUT-OFF 0.00 <1.00  HIV Antibody (routine testing w rflx)  Result Value Ref Range   HIV 1&2 Ab, 4th Generation NON-REACTIVE NON-REACTIVE  Lipid panel  Result Value Ref Range   Cholesterol 217 (H) <200 mg/dL   HDL 30 (L) > OR = 40 mg/dL   Triglycerides 923 (H) <150 mg/dL   LDL Cholesterol (Calc)  mg/dL (calc)   Total CHOL/HDL Ratio 7.2 (H) <5.0 (calc)   Non-HDL Cholesterol (Calc) 187 (H) <130 mg/dL (calc)  TSH  Result Value Ref Range   TSH 1.79 0.40 - 4.50 mIU/L      Assessment & Plan:   1. Essential hypertension, benign  -  CBC with Differential/Platelet - COMPLETE METABOLIC PANEL WITH GFR - losartan (COZAAR) 25 MG tablet; Take 1 tablet (25 mg total) by mouth daily.  Dispense: 90 tablet;  Refill: 3  2. Dyslipidemia  - Lipid panel - COMPLETE METABOLIC PANEL WITH GFR  3. GAD (generalized anxiety disorder)  - escitalopram (LEXAPRO) 20 MG tablet; Take 1 tablet (20 mg total) by mouth daily.  Dispense: 90 tablet; Refill: 3  4. Encounter for screening colonoscopy  - Ambulatory referral to Gastroenterology  5. Need for influenza vaccination  - Flu Vaccine QUAD 6+ mos PF IM (Fluarix Quad PF)   Follow up plan: Return in about 6 months (around 06/25/2022) for follow up.

## 2021-12-27 LAB — CBC WITH DIFFERENTIAL/PLATELET
Absolute Monocytes: 448 cells/uL (ref 200–950)
Basophils Absolute: 22 cells/uL (ref 0–200)
Basophils Relative: 0.4 %
Eosinophils Absolute: 118 cells/uL (ref 15–500)
Eosinophils Relative: 2.1 %
HCT: 43.1 % (ref 38.5–50.0)
Hemoglobin: 15.3 g/dL (ref 13.2–17.1)
Lymphs Abs: 1540 cells/uL (ref 850–3900)
MCH: 31.4 pg (ref 27.0–33.0)
MCHC: 35.5 g/dL (ref 32.0–36.0)
MCV: 88.3 fL (ref 80.0–100.0)
MPV: 11 fL (ref 7.5–12.5)
Monocytes Relative: 8 %
Neutro Abs: 3472 cells/uL (ref 1500–7800)
Neutrophils Relative %: 62 %
Platelets: 221 10*3/uL (ref 140–400)
RBC: 4.88 10*6/uL (ref 4.20–5.80)
RDW: 12.2 % (ref 11.0–15.0)
Total Lymphocyte: 27.5 %
WBC: 5.6 10*3/uL (ref 3.8–10.8)

## 2021-12-27 LAB — COMPLETE METABOLIC PANEL WITH GFR
AG Ratio: 1.9 (calc) (ref 1.0–2.5)
ALT: 43 U/L (ref 9–46)
AST: 22 U/L (ref 10–35)
Albumin: 4.6 g/dL (ref 3.6–5.1)
Alkaline phosphatase (APISO): 98 U/L (ref 35–144)
BUN: 13 mg/dL (ref 7–25)
CO2: 25 mmol/L (ref 20–32)
Calcium: 9.6 mg/dL (ref 8.6–10.3)
Chloride: 101 mmol/L (ref 98–110)
Creat: 1 mg/dL (ref 0.70–1.30)
Globulin: 2.4 g/dL (calc) (ref 1.9–3.7)
Glucose, Bld: 89 mg/dL (ref 65–99)
Potassium: 4 mmol/L (ref 3.5–5.3)
Sodium: 137 mmol/L (ref 135–146)
Total Bilirubin: 0.7 mg/dL (ref 0.2–1.2)
Total Protein: 7 g/dL (ref 6.1–8.1)
eGFR: 89 mL/min/{1.73_m2} (ref >=60)

## 2021-12-27 LAB — LIPID PANEL
Cholesterol: 474 mg/dL — ABNORMAL HIGH (ref <200)
HDL: 33 mg/dL — ABNORMAL LOW (ref >=40)
Non-HDL Cholesterol (Calc): 441 mg/dL (calc) — ABNORMAL HIGH (ref <130)
Total CHOL/HDL Ratio: 14.4 (calc) — ABNORMAL HIGH (ref <5.0)
Triglycerides: 2415 mg/dL — ABNORMAL HIGH (ref <150)

## 2021-12-28 ENCOUNTER — Telehealth: Payer: Self-pay

## 2021-12-28 ENCOUNTER — Other Ambulatory Visit: Payer: Self-pay

## 2021-12-28 DIAGNOSIS — Z1211 Encounter for screening for malignant neoplasm of colon: Secondary | ICD-10-CM

## 2021-12-28 MED ORDER — NA SULFATE-K SULFATE-MG SULF 17.5-3.13-1.6 GM/177ML PO SOLN: 1.0000 | Freq: Once | ORAL | 0 refills | Status: AC

## 2021-12-28 NOTE — Telephone Encounter (Signed)
CALLED PATIENT NO ANSWER LEFT VOICEMAIL FOR A CALL BACK °Letter sent °

## 2021-12-28 NOTE — Addendum Note (Signed)
Addended by: Eliseo Squires on: 12/28/2021 09:45 AM   Modules accepted: Orders

## 2021-12-28 NOTE — Progress Notes (Signed)
Gastroenterology Pre-Procedure Review  Request Date: 01/26/2022 Requesting Physician: Dr. Marius Ditch   PATIENT REVIEW QUESTIONS: The patient responded to the following health history questions as indicated:    1. Are you having any GI issues? no 2. Do you have a personal history of Polyps? no 3. Do you have a family history of Colon Cancer or Polyps? no 4. Diabetes Mellitus? no 5. Joint replacements in the past 12 months?no 6. Major health problems in the past 3 months?no 7. Any artificial heart valves, MVP, or defibrillator?no    MEDICATIONS & ALLERGIES:    Patient reports the following regarding taking any anticoagulation/antiplatelet therapy:   Plavix, Coumadin, Eliquis, Xarelto, Lovenox, Pradaxa, Brilinta, or Effient? no Aspirin? no  Patient confirms/reports the following medications:  Current Outpatient Medications  Medication Sig Dispense Refill   escitalopram (LEXAPRO) 20 MG tablet Take 1 tablet (20 mg total) by mouth daily. 90 tablet 3   fenofibrate (TRICOR) 145 MG tablet Take 145 mg by mouth daily.     Icosapent Ethyl (VASCEPA) 1 g CAPS Take 2 capsules (2 g total) by mouth 2 (two) times daily. 360 capsule 1   losartan (COZAAR) 25 MG tablet Take 1 tablet (25 mg total) by mouth daily. 90 tablet 3   metFORMIN (GLUCOPHAGE-XR) 500 MG 24 hr tablet Take 2 tablets (1,000 mg total) by mouth daily. 180 tablet 0   pioglitazone (ACTOS) 30 MG tablet Take by mouth.     No current facility-administered medications for this visit.    Patient confirms/reports the following allergies:  Allergies  Allergen Reactions   Ace Inhibitors Swelling    Angioedema and rash 05/23/2018   Statins Other (See Comments)    rhabdomyolysis   Levaquin [Levofloxacin In D5w] Swelling    Angioedema and Rash 05/23/2018    No orders of the defined types were placed in this encounter.   AUTHORIZATION INFORMATION Primary Insurance: 1D#: Group #:  Secondary Insurance: 1D#: Group #:  SCHEDULE  INFORMATION: Date: 01/26/2022 Time: Location: [facility]

## 2022-01-02 ENCOUNTER — Telehealth: Payer: Self-pay

## 2022-01-02 NOTE — Telephone Encounter (Signed)
Copied from [place] [phone]. Topic: General - Other >> Jan 02, 2022  2:33 PM Leward Quan A wrote: Reason for CRM: Patient called in to inform nurse that Rx for Icosapent Ethyl (VASCEPA) 1 g CAPS will need a prior authorization. Stated that he was told to call and ask for the Bonnita Nasuti if he had any issues. Please advise and call Ph# [phone]

## 2022-01-03 ENCOUNTER — Other Ambulatory Visit: Payer: Self-pay | Admitting: Nurse Practitioner

## 2022-01-03 DIAGNOSIS — E785 Hyperlipidemia, unspecified: Secondary | ICD-10-CM

## 2022-01-03 MED ORDER — ICOSAPENT ETHYL 1 G PO CAPS: 2.0000 g | ORAL_CAPSULE | Freq: Two times a day (BID) | ORAL | 3 refills | Status: DC

## 2022-01-03 NOTE — Telephone Encounter (Signed)
Have you seen this PA

## 2022-01-26 ENCOUNTER — Ambulatory Visit
Admission: RE | Admit: 2022-01-26 | Discharge: 2022-01-26 | Disposition: A | Discharge status: Home / Self Care | Payer: BC Managed Care – PPO | Attending: Gastroenterology | Admitting: Gastroenterology

## 2022-01-26 ENCOUNTER — Ambulatory Visit: Payer: BC Managed Care – PPO | Admitting: Anesthesiology

## 2022-01-26 ENCOUNTER — Encounter
Admission: RE | Disposition: A | Discharge status: Home / Self Care | Payer: Self-pay | Source: Home / Self Care | Attending: Gastroenterology

## 2022-01-26 DIAGNOSIS — K635 Polyp of colon: Secondary | ICD-10-CM | POA: Diagnosis not present

## 2022-01-26 DIAGNOSIS — Z87891 Personal history of nicotine dependence: Secondary | ICD-10-CM | POA: Diagnosis not present

## 2022-01-26 DIAGNOSIS — N189 Chronic kidney disease, unspecified: Secondary | ICD-10-CM | POA: Diagnosis not present

## 2022-01-26 DIAGNOSIS — D124 Benign neoplasm of descending colon: Secondary | ICD-10-CM | POA: Insufficient documentation

## 2022-01-26 DIAGNOSIS — Z1211 Encounter for screening for malignant neoplasm of colon: Secondary | ICD-10-CM

## 2022-01-26 DIAGNOSIS — I129 Hypertensive chronic kidney disease with stage 1 through stage 4 chronic kidney disease, or unspecified chronic kidney disease: Secondary | ICD-10-CM | POA: Insufficient documentation

## 2022-01-26 DIAGNOSIS — E1122 Type 2 diabetes mellitus with diabetic chronic kidney disease: Secondary | ICD-10-CM | POA: Diagnosis not present

## 2022-01-26 DIAGNOSIS — R7303 Prediabetes: Secondary | ICD-10-CM

## 2022-01-26 HISTORY — PX: COLONOSCOPY WITH PROPOFOL: SHX5780

## 2022-01-26 SURGERY — COLONOSCOPY WITH PROPOFOL
Anesthesia: General

## 2022-01-26 MED ORDER — FENTANYL CITRATE (PF) 100 MCG/2ML IJ SOLN
INTRAMUSCULAR | Status: AC
  Filled 2022-01-26: qty 2

## 2022-01-26 MED ORDER — PROPOFOL 500 MG/50ML IV EMUL
INTRAVENOUS | Status: DC | PRN
  Administered 2022-01-26: 50 ug/kg/min via INTRAVENOUS

## 2022-01-26 MED ORDER — PROPOFOL 10 MG/ML IV BOLUS
INTRAVENOUS | Status: DC | PRN
  Administered 2022-01-26: 50 mg via INTRAVENOUS

## 2022-01-26 MED ORDER — STERILE WATER FOR IRRIGATION IR SOLN
Status: DC | PRN
  Administered 2022-01-26: 100 mL

## 2022-01-26 MED ORDER — LIDOCAINE HCL (PF) 2 % IJ SOLN
INTRAMUSCULAR | Status: AC
  Filled 2022-01-26: qty 5

## 2022-01-26 MED ORDER — MIDAZOLAM HCL 2 MG/2ML IJ SOLN
INTRAMUSCULAR | Status: DC | PRN
  Administered 2022-01-26: 2 mg via INTRAVENOUS

## 2022-01-26 MED ORDER — FENTANYL CITRATE (PF) 100 MCG/2ML IJ SOLN
INTRAMUSCULAR | Status: DC | PRN
  Administered 2022-01-26: 25 ug via INTRAVENOUS
  Administered 2022-01-26: 50 ug via INTRAVENOUS
  Administered 2022-01-26: 25 ug via INTRAVENOUS

## 2022-01-26 MED ORDER — SODIUM CHLORIDE 0.9 % IV SOLN
INTRAVENOUS | Status: DC
  Administered 2022-01-26: 20 mL/h via INTRAVENOUS

## 2022-01-26 MED ORDER — LIDOCAINE HCL (CARDIAC) PF 100 MG/5ML IV SOSY
PREFILLED_SYRINGE | INTRAVENOUS | Status: DC | PRN
Start: 2022-01-26 — End: 2022-01-26
  Administered 2022-01-26: 100 mg via INTRAVENOUS

## 2022-01-26 MED ORDER — PROPOFOL 500 MG/50ML IV EMUL
INTRAVENOUS | Status: AC
Start: 2022-01-26 — End: ?
  Filled 2022-01-26: qty 50

## 2022-01-26 MED ORDER — MIDAZOLAM HCL 2 MG/2ML IJ SOLN
INTRAMUSCULAR | Status: AC
  Filled 2022-01-26: qty 2

## 2022-01-26 NOTE — Anesthesia Postprocedure Evaluation (Signed)
Anesthesia Post Note ? ?Patient: Jesus Barber ? ?Procedure(s) Performed: COLONOSCOPY WITH PROPOFOL ? ?Patient location during evaluation: PACU ?Anesthesia Type: General ?Level of consciousness: awake and awake and alert ?Pain management: satisfactory to patient ?Vital Signs Assessment: post-procedure vital signs reviewed and stable ?Respiratory status: spontaneous breathing and nonlabored ventilation ?Cardiovascular status: stable ?Anesthetic complications: no ? ? ?No notable events documented. ? ? ?Last Vitals:  ?Vitals:  ? 01/26/22 0915 01/26/22 0925  ?BP: (!) 143/86 111/85  ?Pulse: 73 83  ?Resp: 20 18  ?Temp:    ?SpO2: 96% 96%  ?  ?Last Pain:  ?Vitals:  ? 01/26/22 0925  ?TempSrc:   ?PainSc: 0-No pain  ? ? ?  ?  ?  ?  ?  ?  ? ?VAN STAVEREN,Alazae Crymes ? ? ? ? ?

## 2022-01-26 NOTE — Transfer of Care (Signed)
Immediate Anesthesia Transfer of Care Note ? ?Patient: Jesus Barber ? ?Procedure(s) Performed: COLONOSCOPY WITH PROPOFOL ? ?Patient Location: PACU ? ?Anesthesia Type:General ? ?Level of Consciousness: sedated ? ?Airway & Oxygen Therapy: Patient Spontanous Breathing ? ?Post-op Assessment: Report given to RN ? ?Post vital signs: Reviewed and stable ? ?Last Vitals:  ?Vitals Value Taken Time  ?BP 138/81 01/26/22 0905  ?Temp 35.6 ?C 01/26/22 0905  ?Pulse 89 01/26/22 0906  ?Resp 15 01/26/22 0905  ?SpO2 96 % 01/26/22 0906  ?Vitals shown include unvalidated device data. ? ?Last Pain:  ?Vitals:  ? 01/26/22 0905  ?TempSrc: Tympanic  ?PainSc: Asleep  ?   ? ?  ? ?Complications: No notable events documented. ?

## 2022-01-26 NOTE — Op Note (Signed)
[facility] ?Gastroenterology ?Patient Name: Jesus Barber ?Procedure Date: 01/26/2022 8:39 AM ?MRN: [id number] ?Account #: [id number] ?Date of Birth: 20-Feb-1966 ?Admit Type: Outpatient ?Age: 56 ?Room: [facility] ENDO ROOM 1 ?Gender: Male ?Note Status: Finalized ?Instrument Name: Colonscope [id number] ?Procedure:             Colonoscopy ?Indications:           Screening for colorectal malignant neoplasm, This is  ?                       the patient's first colonoscopy ?Providers:             Lin Landsman MD, MD ?Referring MD:          Delsa Grana (Referring MD) ?Medicines:             General Anesthesia ?Complications:         No immediate complications. Estimated blood loss: None. ?Procedure:             Pre-Anesthesia Assessment: ?                       - Prior to the procedure, a History and Physical was  ?                       performed, and patient medications and allergies were  ?                       reviewed. The patient is competent. The risks and  ?                       benefits of the procedure and the sedation options and  ?                       risks were discussed with the patient. All questions  ?                       were answered and informed consent was obtained.  ?                       Patient identification and proposed procedure were  ?                       verified by the physician, the nurse, the  ?                       anesthesiologist, the anesthetist and the technician  ?                       in the pre-procedure area in the procedure room in the  ?                       endoscopy suite. Mental Status Examination: alert and  ?                       oriented. Airway Examination: normal oropharyngeal  ?                       airway and neck mobility. Respiratory Examination:  ?  clear to auscultation. CV Examination: normal.  ?                       Prophylactic Antibiotics: The patient does not require  ?                       prophylactic  antibiotics. Prior Anticoagulants: The  ?                       patient has taken no previous anticoagulant or  ?                       antiplatelet agents. ASA Grade Assessment: II - A  ?                       patient with mild systemic disease. After reviewing  ?                       the risks and benefits, the patient was deemed in  ?                       satisfactory condition to undergo the procedure. The  ?                       anesthesia plan was to use general anesthesia.  ?                       Immediately prior to administration of medications,  ?                       the patient was re-assessed for adequacy to receive  ?                       sedatives. The heart rate, respiratory rate, oxygen  ?                       saturations, blood pressure, adequacy of pulmonary  ?                       ventilation, and response to care were monitored  ?                       throughout the procedure. The physical status of the  ?                       patient was re-assessed after the procedure. ?                       After obtaining informed consent, the colonoscope was  ?                       passed under direct vision. Throughout the procedure,  ?                       the patient's blood pressure, pulse, and oxygen  ?                       saturations were monitored continuously. The  ?  Colonoscope was introduced through the anus and  ?                       advanced to the the terminal ileum, with  ?                       identification of the appendiceal orifice and IC  ?                       valve. The colonoscopy was performed without  ?                       difficulty. The patient tolerated the procedure well.  ?                       The quality of the bowel preparation was evaluated  ?                       using the BBPS [facility] Bowel Preparation Scale) with  ?                       scores of: Right Colon = 3, Transverse Colon = 3 and  ?                       Left Colon =  3 (entire mucosa seen well with no  ?                       residual staining, small fragments of stool or opaque  ?                       liquid). The total BBPS score equals 9. ?Findings: ?     The perianal and digital rectal examinations were normal. Pertinent  ?     negatives include normal sphincter tone and no palpable rectal lesions. ?     The terminal ileum appeared normal. ?     A 4 mm polyp was found in the descending colon. The polyp was sessile.  ?     The polyp was removed with a cold snare. Resection and retrieval were  ?     complete. Estimated blood loss: none. ?     The retroflexed view of the distal rectum and anal verge was normal and  ?     showed no anal or rectal abnormalities. ?Impression:            - The examined portion of the ileum was normal. ?                       - One 4 mm polyp in the descending colon, removed with  ?                       a cold snare. Resected and retrieved. ?                       - The distal rectum and anal verge are normal on  ?                       retroflexion view. ?Recommendation:        - Discharge  patient to home (with escort). ?                       - Resume previous diet today. ?                       - Continue present medications. ?                       - Await pathology results. ?                       - Repeat colonoscopy in 7-10 years for surveillance  ?                       based on pathology results. ?Procedure Code(s):     --- Professional --- ?                       [phone], Colonoscopy, flexible; with removal of  ?                       tumor(s), polyp(s), or other lesion(s) by snare  ?                       technique ?Diagnosis Code(s):     --- Professional --- ?                       Z12.11, Encounter for screening for malignant neoplasm  ?                       of colon ?                       K63.5, Polyp of colon ?CPT copyright 2019 American Medical Association. All rights reserved. ?The codes documented in this report are preliminary  and upon coder review may  ?be revised to meet current compliance requirements. ?Dr. Ulyess Mort ?Shyna Duignan Raeanne Gathers MD, MD ?01/26/2022 8:59:16 AM ?This report has been signed electronically. ?Number of Addenda: 0 ?Note Initiated On: 01/26/2022 8:39 AM ?Scope Withdrawal Time: 0 hours 7 minutes 41 seconds  ?Total Procedure Duration: 0 hours 9 minutes 15 seconds  ?Estimated Blood Loss:  Estimated blood loss: none. ?     [facility] ?

## 2022-01-26 NOTE — Anesthesia Preprocedure Evaluation (Signed)
Anesthesia Evaluation  ?Patient identified by MRN, date of birth, ID band ?Patient awake ? ? ? ?Reviewed: ?Allergy & Precautions, NPO status , Patient's Chart, lab work & pertinent test results ? ?Airway ?Mallampati: II ? ?TM Distance: >3 FB ?Neck ROM: full ? ? ? Dental ? ?(+) Teeth Intact ?  ?Pulmonary ?neg pulmonary ROS, former smoker,  ?  ?Pulmonary exam normal ?breath sounds clear to auscultation ? ? ? ? ? ? Cardiovascular ?Exercise Tolerance: Good ?hypertension, Pt. on medications ?negative cardio ROS ?Normal cardiovascular exam ?Rhythm:Regular  ? ?  ?Neuro/Psych ?negative neurological ROS ? negative psych ROS  ? GI/Hepatic ?negative GI ROS, Neg liver ROS,   ?Endo/Other  ?negative endocrine ROSdiabetes, Well Controlled, Type 2 ? Renal/GU ?negative Renal ROS  ?negative genitourinary ?  ?Musculoskeletal ?negative musculoskeletal ROS ?(+)  ? Abdominal ?Normal abdominal exam  (+)   ?Peds ?negative pediatric ROS ?(+)  Hematology ?negative hematology ROS ?(+)   ?Anesthesia Other Findings ?Past Medical History: ?No date: Ankle fracture ?No date: Back pain ?    Comment:  Herniated Disk ?No date: Chronic kidney disease ?No date: Elevated liver enzymes ?No date: Fatty liver ?08/21/2019: High triglycerides ?No date: High triglycerides ?No date: Hyperlipidemia ?No date: Hypertension ?No date: Hypogonadism in male ?No date: IFG (impaired fasting glucose) ? ?Past Surgical History: ?No date: BACK SURGERY ?    Comment:  Discectomy ? ?BMI   ? Body Mass Index: 30.34 kg/m?  ?  ? ? Reproductive/Obstetrics ?negative OB ROS ? ?  ? ? ? ? ? ? ? ? ? ? ? ? ? ?  ?  ? ? ? ? ? ? ? ?Anesthesia Physical ?Anesthesia Plan ? ?ASA: 2 ? ?Anesthesia Plan: General  ? ?Post-op Pain Management:   ? ?Induction: Intravenous ? ?PONV Risk Score and Plan: Propofol infusion and TIVA ? ?Airway Management Planned: Natural Airway and Nasal Cannula ? ?Additional Equipment:  ? ?Intra-op Plan:  ? ?Post-operative Plan:   ? ?Informed Consent: I have reviewed the patients History and Physical, chart, labs and discussed the procedure including the risks, benefits and alternatives for the proposed anesthesia with the patient or authorized representative who has indicated his/her understanding and acceptance.  ? ? ? ?Dental Advisory Given ? ?Plan Discussed with: CRNA and Surgeon ? ?Anesthesia Plan Comments:   ? ? ? ? ? ? ?Anesthesia Quick Evaluation ? ?

## 2022-01-26 NOTE — H&P (Signed)
?Cephas Darby, MD ?[address]  ?[address]  ?[place], [place] [address]  ?Main: [phone]  ?Fax: [phone] ?Pager: [phone] ? ?Primary Care Physician:  Delsa Grana, PA-C ?Primary Gastroenterologist:  Dr. Cephas Darby ? ?Pre-Procedure History & Physical: ?HPI:  Korver Graybeal is a 56 y.o. male is here for an colonoscopy. ?  ?Past Medical History:  ?Diagnosis Date  ? Ankle fracture   ? Back pain   ? Herniated Disk  ? Chronic kidney disease   ? Elevated liver enzymes   ? Fatty liver   ? High triglycerides 08/21/2019  ? High triglycerides   ? Hyperlipidemia   ? Hypertension   ? Hypogonadism in male   ? IFG (impaired fasting glucose)   ? ? ?Past Surgical History:  ?Procedure Laterality Date  ? BACK SURGERY    ? Discectomy  ? ? ?Prior to Admission medications   ?Medication Sig Start Date End Date Taking? Authorizing Provider  ?escitalopram (LEXAPRO) 20 MG tablet Take 1 tablet (20 mg total) by mouth daily. 12/26/21  Yes Bo Merino, FNP  ?fenofibrate (TRICOR) 145 MG tablet Take 145 mg by mouth daily. 12/31/18  Yes [provider]  ?icosapent Ethyl (VASCEPA) 1 g capsule Take 2 capsules (2 g total) by mouth 2 (two) times daily. 01/03/22  Yes Bo Merino, FNP  ?losartan (COZAAR) 25 MG tablet Take 1 tablet (25 mg total) by mouth daily. 12/26/21  Yes Bo Merino, FNP  ?metFORMIN (GLUCOPHAGE-XR) 500 MG 24 hr tablet Take 2 tablets (1,000 mg total) by mouth daily. 04/02/19  Yes Poulose, Bethel Born, NP  ?pioglitazone (ACTOS) 30 MG tablet Take by mouth. 06/13/21 06/13/22 Yes [provider]  ? ? ?Allergies as of 12/28/2021 - Review Complete 12/28/2021  ?Allergen Reaction Noted  ? Ace inhibitors Swelling 05/23/2018  ? Statins Other (See Comments) 07/20/2019  ? Levaquin [levofloxacin in d5w] Swelling 05/23/2018  ? ? ?Family History  ?Problem Relation Age of Onset  ? Cancer Father   ?     lung  ? Alzheimer's disease Father   ? Hyperlipidemia Paternal Grandmother   ? ? ?Social History   ? ?Socioeconomic History  ? Marital status: Married  ?  Spouse name: Roland Rack  ? Number of children: 1  ? Years of education: Not on file  ? Highest education level: High school graduate  ?Occupational History  ? Not on file  ?Tobacco Use  ? Smoking status: Former  ?  Years: 25.00  ?  Types: Cigarettes  ?  Quit date: 11/20/2003  ?  Years since quitting: 18.1  ? Smokeless tobacco: Current  ?Vaping Use  ? Vaping Use: Never used  ?Substance and Sexual Activity  ? Alcohol use: Yes  ?  Comment: on occasion   ? Drug use: No  ? Sexual activity: Yes  ?  Partners: Female  ?Other Topics Concern  ? Not on file  ?Social History Narrative  ? Not on file  ? ?Social Determinants of Health  ? ?Financial Resource Strain: High Risk  ? Difficulty of Paying Living Expenses: Hard  ?Food Insecurity: Food Insecurity Present  ? Worried About Charity fundraiser in the Last Year: Often true  ? Ran Out of Food in the Last Year: Often true  ?Transportation Needs: No Transportation Needs  ? Lack of Transportation (Medical): No  ? Lack of Transportation (Non-Medical): No  ?Physical Activity: Sufficiently Active  ? Days of Exercise per Week: 4 days  ? Minutes of  Exercise per Session: 40 min  ?Stress: Stress Concern Present  ? Feeling of Stress : To some extent  ?Social Connections: Moderately Integrated  ? Frequency of Communication with Friends and Family: Three times a week  ? Frequency of Social Gatherings with Friends and Family: Once a week  ? Attends Religious Services: More than 4 times per year  ? Active Member of Clubs or Organizations: No  ? Attends Archivist Meetings: Never  ? Marital Status: Married  ?Intimate Partner Violence: Not At Risk  ? Fear of Current or Ex-Partner: No  ? Emotionally Abused: No  ? Physically Abused: No  ? Sexually Abused: No  ? ? ?Review of Systems: ?See HPI, otherwise negative ROS ? ?Physical Exam: ?BP (!) 142/93   Pulse 82   Temp (!) 96 ?F (35.6 ?C) (Temporal)   Resp 20   Ht '6\' 1"'$  (1.854 m)    Wt 104.3 kg   SpO2 97%   BMI 30.34 kg/m?  ?General:   Alert,  pleasant and cooperative in NAD ?Head:  Normocephalic and atraumatic. ?Neck:  Supple; no masses or thyromegaly. ?Lungs:  Clear throughout to auscultation.    ?Heart:  Regular rate and rhythm. ?Abdomen:  Soft, nontender and nondistended. Normal bowel sounds, without guarding, and without rebound.   ?Neurologic:  Alert and  oriented x4;  grossly normal neurologically. ? ?Impression/Plan: ?Tabb Croghan is here for an colonoscopy to be performed for colon cancer screening ? ?Risks, benefits, limitations, and alternatives regarding  colonoscopy have been reviewed with the patient.  Questions have been answered.  All parties agreeable. ? ? ?Sherri Sear, MD  01/26/2022, 8:33 AM ?

## 2022-01-29 ENCOUNTER — Encounter: Payer: Self-pay | Admitting: Gastroenterology

## 2022-01-29 LAB — SURGICAL PATHOLOGY

## 2022-04-14 ENCOUNTER — Ambulatory Visit (INDEPENDENT_AMBULATORY_CARE_PROVIDER_SITE_OTHER): Payer: BC Managed Care – PPO

## 2022-04-14 ENCOUNTER — Ambulatory Visit
Admission: EM | Admit: 2022-04-14 | Discharge: 2022-04-14 | Disposition: A | Discharge status: Home / Self Care | Payer: BC Managed Care – PPO | Attending: Emergency Medicine | Admitting: Emergency Medicine

## 2022-04-14 DIAGNOSIS — R059 Cough, unspecified: Secondary | ICD-10-CM | POA: Diagnosis not present

## 2022-04-14 DIAGNOSIS — R6883 Chills (without fever): Secondary | ICD-10-CM | POA: Diagnosis not present

## 2022-04-14 DIAGNOSIS — J069 Acute upper respiratory infection, unspecified: Secondary | ICD-10-CM | POA: Insufficient documentation

## 2022-04-14 DIAGNOSIS — R079 Chest pain, unspecified: Secondary | ICD-10-CM | POA: Diagnosis not present

## 2022-04-14 LAB — GLUCOSE, CAPILLARY: Glucose-Capillary: 129 mg/dL — ABNORMAL HIGH (ref 70–99)

## 2022-04-14 MED ORDER — BENZONATATE 100 MG PO CAPS: 100.0000 mg | ORAL_CAPSULE | Freq: Three times a day (TID) | ORAL | 0 refills | Status: AC | PRN

## 2022-04-14 MED ORDER — MELOXICAM 7.5 MG PO TABS: 7.5000 mg | ORAL_TABLET | Freq: Every day | ORAL | 0 refills | Status: AC

## 2022-04-14 NOTE — ED Provider Notes (Signed)
Patient Contact: 9:39 AM (approximate)   History   Cough   HPI  Jesus Barber is a 56 y.o. male presents to the emergency department with nonproductive cough for the past 2 to 3 days.  Patient states that he has been symptomatic with viral URI-like symptoms for the past 2 weeks.  He denies current chest pain, chest tightness or shortness of breath.  He reports that his wife recently tested positive for COVID-19.      Physical Exam   Triage Vital Signs: ED Triage Vitals  Enc Vitals Group     BP 04/14/22 0904 115/87     Pulse Rate 04/14/22 0904 95     Resp 04/14/22 0904 18     Temp 04/14/22 0904 98.1 F (36.7 C)     Temp Source 04/14/22 0904 Oral     SpO2 04/14/22 0904 97 %     Weight --      Height --      Head Circumference --      Peak Flow --      Pain Score 04/14/22 0905 0     Pain Loc --      Pain Edu? --      Excl. in [place]? --     Most recent vital signs: Vitals:   04/14/22 0904  BP: 115/87  Pulse: 95  Resp: 18  Temp: 98.1 F (36.7 C)  SpO2: 97%     General: Alert and in no acute distress. Eyes:  PERRL. EOMI. Head: No acute traumatic findings ENT:      Ears:       Nose: No congestion/rhinnorhea.      Mouth/Throat: Mucous membranes are moist. Neck: No stridor. No cervical spine tenderness to palpation. Cardiovascular:  Good peripheral perfusion Respiratory: Normal respiratory effort without tachypnea or retractions. Lungs CTAB. Good air entry to the bases with no decreased or absent breath sounds. Gastrointestinal: Bowel sounds 4 quadrants. Soft and nontender to palpation. No guarding or rigidity. No palpable masses. No distention. No CVA tenderness. Musculoskeletal: Full range of motion to all extremities.  Neurologic:  No gross focal neurologic deficits are appreciated.  Skin:   No rash noted Other:   ED Results / Procedures / Treatments   Labs (all labs ordered are listed, but only abnormal results are displayed) Labs Reviewed   GLUCOSE, CAPILLARY - Abnormal; Notable for the following components:      Result Value   Glucose-Capillary 129 (*)    All other components within normal limits  CBG MONITORING, ED        RADIOLOGY  I personally viewed and evaluated these images as part of my medical decision making, as well as reviewing the written report by the radiologist.  ED Provider Interpretation: No consolidations, opacities or infiltrates to suggest pneumonia.   PROCEDURES:  Critical Care performed: No  Procedures   MEDICATIONS ORDERED IN ED: Medications - No data to display   IMPRESSION / MDM / [place] / ED COURSE  I reviewed the triage vital signs and the nursing notes.                              Assessment and plan:  Cough:  56 year old male presents to the urgent care with cough, chills and other viral URI-like symptoms.  Patient's wife recently tested positive for COVID.  Suspect COVID-19 infection.  No signs of post viral pneumonia on chest x-ray.  Recommended Tessalon Perles and daily meloxicam for body aches.  I question patient about diabetes and patient states that he was prescribed metformin and Actos in the past for prediabetes.  I recommended taking low-dose meloxicam for only the next 2 to 3 days.  Return precautions were given to return with new or worsening symptoms.     FINAL CLINICAL IMPRESSION(S) / ED DIAGNOSES   Final diagnoses:  Viral URI with cough     Rx / DC Orders   ED Discharge Orders          Ordered    benzonatate (TESSALON PERLES) 100 MG capsule  3 times daily PRN        04/14/22 1036    meloxicam (MOBIC) 7.5 MG tablet  Daily        04/14/22 1047             Note:  This document was prepared using Dragon voice recognition software and may include unintentional dictation errors.   [place], [place] 04/14/22 1109

## 2022-04-14 NOTE — Discharge Instructions (Signed)
You can take 2 Tessalon Perles at night before bed and 1 Mucinex DM

## 2022-04-14 NOTE — ED Triage Notes (Signed)
Pt present  non productive cough, pt state that two weeks ago he had symptoms of chills and back pain. Those symptoms has subsided but he is experiencing a cough. Pt states his wife came here to this location earlier part of the week and tested positive for covid.

## 2022-06-22 ENCOUNTER — Ambulatory Visit: Payer: BC Managed Care – PPO | Admitting: Nurse Practitioner

## 2022-06-22 ENCOUNTER — Encounter: Payer: Self-pay | Admitting: Nurse Practitioner

## 2022-06-22 VITALS — BP 142/86 | HR 110 | Temp 98.6°F | Resp 16 | Ht 73.0 in | Wt 235.6 lb

## 2022-06-22 DIAGNOSIS — E785 Hyperlipidemia, unspecified: Secondary | ICD-10-CM

## 2022-06-22 DIAGNOSIS — R7303 Prediabetes: Secondary | ICD-10-CM | POA: Diagnosis not present

## 2022-06-22 DIAGNOSIS — I10 Essential (primary) hypertension: Secondary | ICD-10-CM | POA: Diagnosis not present

## 2022-06-22 DIAGNOSIS — F411 Generalized anxiety disorder: Secondary | ICD-10-CM | POA: Diagnosis not present

## 2022-06-22 DIAGNOSIS — E781 Pure hyperglyceridemia: Secondary | ICD-10-CM

## 2022-06-22 NOTE — Assessment & Plan Note (Signed)
Continue taking Lexapro 20 mg daily.

## 2022-06-22 NOTE — Assessment & Plan Note (Signed)
Patient is currently taking fenofibrate 145 mg daily and Vascepa 2 g 2 times a day he just recently restarted this a couple weeks ago.  He was unable to take this medication due to his insurance running out.  We had a long conversation regarding what to do to help decrease his glycerides which includes decreasing alcohol consumption, red meat and fried foods.

## 2022-06-22 NOTE — Assessment & Plan Note (Signed)
Last A1c was 5.8 on 04/28/2021 we will get labs today.  Patient was prescribed metformin and Actos by Dr. Sanda Klein, but he has not been taking it recently.

## 2022-06-22 NOTE — Assessment & Plan Note (Signed)
Blood pressure not at goal today.  Patient just recently restarted taking losartan 25 mg daily.  He had been out of his medication due to insurance coverage.

## 2022-06-22 NOTE — Progress Notes (Signed)
BP (!) 142/86   Pulse (!) 110 Comment: Pt sat for 5 mins  Temp 98.6 F (37 C) (Oral)   Resp 16   Ht '6\' 1"'$  (1.854 m)   Wt 235 lb 9.6 oz (106.9 kg)   SpO2 94%   BMI 31.08 kg/m    Subjective:    Patient ID: Jesus Barber, male    DOB: [birth year], 56 y.o.   MRN: [id number]  HPI: Jesus Barber is a 56 y.o. male  Chief Complaint  Patient presents with   Follow-up   Hyperlipidemia   Hypertension   Anxiety   PreDM   HTN: His blood pressure today is 142/86.  He does not check his blood pressure at home.  He denies any chest pain, headaches, shortness of breath or blurred vision.  He is currently taking losartan 25 mg daily.  Patient states he was out of insurance for several months.  So has not been taking his medications regularly.  States he has health insurance now and is hoping to be able to continue with his medication daily.  Hyperlipidemia: His last LDL was unable to be calculated due to his high triglycerides his triglycerides were 2415.  Patient restarted his fenofibrate 145 mg daily and Vascepa 2 g 2 times a day a couple weeks ago.  Patient states he was unable to afford this medication when his insurance ran out.  Had a long discussion about his triglycerides.  Patient currently drinks approximately 2 beers a day except on weekends and drinks over a sixpack.  Discussed decreasing alcohol consumption, red meat and fried foods.  Prediabetes: His last A1c was 5.8 on 04/28/2021.  We will recheck today.  Patient denies any polyuria, polydipsia or polyphasia.  He was prescribed metformin and Actos by Dr. Sanda Klein.  Patient has not really been taking it.  Labs today.  GAD: Patient currently takes Lexapro 20 mg daily for his mood.  Patient states that he still has stressful moments.  But states overall he is doing well.    06/22/2022    8:29 AM 12/26/2021    3:24 PM 05/04/2021    3:39 PM 08/21/2019   10:50 AM  GAD 7 : Generalized Anxiety Score  Nervous, Anxious, on Edge 0 0 0 0   Control/stop worrying 0 0 0 0  Worry too much - different things 0 0 0 0  Trouble relaxing 0 0 0 0  Restless 0 0 0 0  Easily annoyed or irritable 0 0 1 1  Afraid - awful might happen 0 0 0 0  Total GAD 7 Score 0 0 1 1  Anxiety Difficulty Not difficult at all Not difficult at all Not difficult at all Not difficult at all        06/22/2022    8:28 AM 12/26/2021    3:24 PM 05/04/2021    3:38 PM 08/21/2019   10:20 AM 07/20/2019    9:50 AM  Depression screen PHQ 2/9  Decreased Interest 0 0 0 0 0  Down, Depressed, Hopeless 0 0 0 0 0  PHQ - 2 Score 0 0 0 0 0  Altered sleeping 0 0 1 0 0  Tired, decreased energy 0 0 1 0 0  Change in appetite 0 0 0 0 0  Feeling bad or failure about yourself  0 0 0 0 0  Trouble concentrating 0 0 0 0 0  Moving slowly or fidgety/restless 0 0 0 0 0  Suicidal thoughts 0 0 0  0 0  PHQ-9 Score 0 0 2 0 0  Difficult doing work/chores Not difficult at all Not difficult at all Not difficult at all Not difficult at all Not difficult at all     Relevant past medical, surgical, family and social history reviewed and updated as indicated. Interim medical history since our last visit reviewed. Allergies and medications reviewed and updated.  Review of Systems  Constitutional: Negative for fever or weight change.  Respiratory: Negative for cough and shortness of breath.   Cardiovascular: Negative for chest pain or palpitations.  Gastrointestinal: Negative for abdominal pain, no bowel changes.  Musculoskeletal: Negative for gait problem or joint swelling.  Skin: Negative for rash.  Neurological: Negative for dizziness or headache.  No other specific complaints in a complete review of systems (except as listed in HPI above).      Objective:    BP (!) 142/86   Pulse (!) 110 Comment: Pt sat for 5 mins  Temp 98.6 F (37 C) (Oral)   Resp 16   Ht '6\' 1"'$  (1.854 m)   Wt 235 lb 9.6 oz (106.9 kg)   SpO2 94%   BMI 31.08 kg/m   Wt Readings from Last 3 Encounters:   06/22/22 235 lb 9.6 oz (106.9 kg)  01/26/22 230 lb (104.3 kg)  12/26/21 238 lb 1.6 oz (108 kg)    Physical Exam  Constitutional: Patient appears well-developed and well-nourished. Obese  No distress.  HEENT: head atraumatic, normocephalic, pupils equal and reactive to light,  neck supple Cardiovascular: Normal rate, regular rhythm and normal heart sounds.  No murmur heard. No BLE edema. Pulmonary/Chest: Effort normal and breath sounds normal. No respiratory distress. Abdominal: Soft.  There is no tenderness. Psychiatric: Patient has a normal mood and affect. behavior is normal. Judgment and thought content normal.  Results for orders placed or performed during the hospital encounter of 04/14/22  Glucose, capillary  Result Value Ref Range   Glucose-Capillary 129 (H) 70 - 99 mg/dL      Assessment & Plan:   Problem List Items Addressed This Visit       Cardiovascular and Mediastinum   Essential hypertension, benign - Primary (Chronic)    Blood pressure not at goal today.  Patient just recently restarted taking losartan 25 mg daily.  He had been out of his medication due to insurance coverage.       Relevant Orders   CBC with Differential/Platelet   COMPLETE METABOLIC PANEL WITH GFR     Other   Dyslipidemia (Chronic)   Relevant Orders   Lipid panel   COMPLETE METABOLIC PANEL WITH GFR   Hypertriglyceridemia (Chronic)    Patient is currently taking fenofibrate 145 mg daily and Vascepa 2 g 2 times a day he just recently restarted this a couple weeks ago.  He was unable to take this medication due to his insurance running out.  We had a long conversation regarding what to do to help decrease his glycerides which includes decreasing alcohol consumption, red meat and fried foods.      Prediabetes    Last A1c was 5.8 on 04/28/2021 we will get labs today.  Patient was prescribed metformin and Actos by Dr. Sanda Klein, but he has not been taking it recently.      Relevant Orders    Hemoglobin A1c   GAD (generalized anxiety disorder)    Continue taking Lexapro 20 mg daily.        Follow up plan: Return in about 3 months (  around 09/22/2022) for follow up.

## 2022-06-23 LAB — HEMOGLOBIN A1C
Hgb A1c MFr Bld: 6.1 % of total Hgb — ABNORMAL HIGH (ref <5.7)
Mean Plasma Glucose: 128 mg/dL
eAG (mmol/L): 7.1 mmol/L

## 2022-06-23 LAB — CBC WITH DIFFERENTIAL/PLATELET
Absolute Monocytes: 474 cells/uL (ref 200–950)
Basophils Absolute: 18 cells/uL (ref 0–200)
Basophils Relative: 0.4 %
Eosinophils Absolute: 101 cells/uL (ref 15–500)
Eosinophils Relative: 2.2 %
HCT: 44 % (ref 38.5–50.0)
Hemoglobin: 15.6 g/dL (ref 13.2–17.1)
Lymphs Abs: 1431 cells/uL (ref 850–3900)
MCH: 31.7 pg (ref 27.0–33.0)
MCHC: 35.5 g/dL (ref 32.0–36.0)
MCV: 89.4 fL (ref 80.0–100.0)
MPV: 10.7 fL (ref 7.5–12.5)
Monocytes Relative: 10.3 %
Neutro Abs: 2576 cells/uL (ref 1500–7800)
Neutrophils Relative %: 56 %
Platelets: 192 10*3/uL (ref 140–400)
RBC: 4.92 10*6/uL (ref 4.20–5.80)
RDW: 12.3 % (ref 11.0–15.0)
Total Lymphocyte: 31.1 %
WBC: 4.6 10*3/uL (ref 3.8–10.8)

## 2022-06-23 LAB — COMPLETE METABOLIC PANEL WITH GFR
AG Ratio: 2.2 (calc) (ref 1.0–2.5)
ALT: 77 U/L — ABNORMAL HIGH (ref 9–46)
AST: 44 U/L — ABNORMAL HIGH (ref 10–35)
Albumin: 5.1 g/dL (ref 3.6–5.1)
Alkaline phosphatase (APISO): 74 U/L (ref 35–144)
BUN: 17 mg/dL (ref 7–25)
CO2: 26 mmol/L (ref 20–32)
Calcium: 10 mg/dL (ref 8.6–10.3)
Chloride: 101 mmol/L (ref 98–110)
Creat: 1.19 mg/dL (ref 0.70–1.30)
Globulin: 2.3 g/dL (calc) (ref 1.9–3.7)
Glucose, Bld: 145 mg/dL — ABNORMAL HIGH (ref 65–99)
Potassium: 4.1 mmol/L (ref 3.5–5.3)
Sodium: 138 mmol/L (ref 135–146)
Total Bilirubin: 0.7 mg/dL (ref 0.2–1.2)
Total Protein: 7.4 g/dL (ref 6.1–8.1)
eGFR: 73 mL/min/{1.73_m2} (ref >=60)

## 2022-06-23 LAB — LIPID PANEL
Cholesterol: 370 mg/dL — ABNORMAL HIGH (ref <200)
HDL: 34 mg/dL — ABNORMAL LOW (ref >=40)
Non-HDL Cholesterol (Calc): 336 mg/dL (calc) — ABNORMAL HIGH (ref <130)
Total CHOL/HDL Ratio: 10.9 (calc) — ABNORMAL HIGH (ref <5.0)
Triglycerides: 1916 mg/dL — ABNORMAL HIGH (ref <150)

## 2022-06-28 ENCOUNTER — Telehealth: Payer: Self-pay

## 2022-06-28 ENCOUNTER — Other Ambulatory Visit: Payer: Self-pay | Admitting: Nurse Practitioner

## 2022-06-28 DIAGNOSIS — E785 Hyperlipidemia, unspecified: Secondary | ICD-10-CM

## 2022-06-28 DIAGNOSIS — R7301 Impaired fasting glucose: Secondary | ICD-10-CM

## 2022-06-28 MED ORDER — METFORMIN HCL ER 500 MG PO TB24: 1000.0000 mg | ORAL_TABLET | Freq: Every day | ORAL | 3 refills | Status: DC

## 2022-06-28 NOTE — Telephone Encounter (Signed)
Pt given lab results per notes of Almyra Free, NP on 06/28/22. Pt verbalized understanding. Pt is asking is about refill on metformin since A1C went up. He states he has stopped taking it a while back and unsure who prescribed it. I advised him I would send it to [place] and see if she can refill. No further assistance needed.

## 2022-06-28 NOTE — Telephone Encounter (Signed)
Please advise.Its been 3 years since refill

## 2022-08-27 ENCOUNTER — Telehealth: Payer: Self-pay | Admitting: Unknown Physician Specialty

## 2022-08-27 DIAGNOSIS — I10 Essential (primary) hypertension: Secondary | ICD-10-CM

## 2022-08-28 ENCOUNTER — Other Ambulatory Visit: Payer: Self-pay | Admitting: Emergency Medicine

## 2022-08-28 NOTE — Telephone Encounter (Signed)
Requested Prescriptions  Pending Prescriptions Disp Refills  . losartan (COZAAR) 25 MG tablet [Pharmacy Med Name: LOSARTAN '25MG'$  TABLETS] 90 tablet 3    Sig: TAKE 1 TABLET(25 MG) BY MOUTH DAILY     Cardiovascular:  Angiotensin Receptor Blockers Failed - 08/27/2022  3:12 AM      Failed - Last BP in normal range    BP Readings from Last 1 Encounters:  06/22/22 (!) 142/86         Passed - Cr in normal range and within 180 days    Creat  Date Value Ref Range Status  06/22/2022 1.19 0.70 - 1.30 mg/dL Final         Passed - K in normal range and within 180 days    Potassium  Date Value Ref Range Status  06/22/2022 4.1 3.5 - 5.3 mmol/L Final         Passed - Patient is not pregnant      Passed - Valid encounter within last 6 months    Recent Outpatient Visits          2 months ago Essential hypertension, benign   [facility] Bo Merino, FNP   8 months ago Essential hypertension, benign   [facility] Bo Merino, FNP   1 year ago Hypertriglyceridemia   [place], NP   3 years ago GAD (generalized anxiety disorder)   [facility] Delsa Grana, PA-C   3 years ago Essential hypertension, benign   [place], [place] Born, NP      Future Appointments            In 3 weeks Reece Packer, Myna Hidalgo, [facility], [facility]

## 2022-09-24 ENCOUNTER — Ambulatory Visit: Payer: BC Managed Care – PPO | Admitting: Nurse Practitioner

## 2023-01-23 ENCOUNTER — Other Ambulatory Visit: Payer: Self-pay | Admitting: Nurse Practitioner

## 2023-01-23 DIAGNOSIS — E785 Hyperlipidemia, unspecified: Secondary | ICD-10-CM

## 2023-01-23 DIAGNOSIS — F411 Generalized anxiety disorder: Secondary | ICD-10-CM

## 2023-01-23 NOTE — Telephone Encounter (Signed)
Patient called, left VM to return the call to the office to scheduled an appt for medication refill request.   

## 2023-01-23 NOTE — Telephone Encounter (Signed)
Requested Prescriptions  Pending Prescriptions Disp Refills   escitalopram (LEXAPRO) 20 MG tablet [Pharmacy Med Name: ESCITALOPRAM '20MG'$  TABLETS] 30 tablet 0    Sig: TAKE 1 TABLET(20 MG) BY MOUTH DAILY     Psychiatry:  Antidepressants - SSRI Failed - 01/23/2023  7:09 AM      Failed - Valid encounter within last 6 months    Recent Outpatient Visits           7 months ago Essential hypertension, benign   [facility] Bo Merino, FNP   1 year ago Essential hypertension, benign   [facility] Bo Merino, FNP   1 year ago Hypertriglyceridemia   [facility] Kathrine Haddock, NP   3 years ago GAD (generalized anxiety disorder)   [facility] Delsa Grana, PA-C   3 years ago Essential hypertension, benign   [place], NP               VASCEPA 1 g capsule [Pharmacy Med Name: VASCEPA 1GM CAPSULES] 120 capsule 0    Sig: TAKE 2 CAPSULES(2 GRAMS) BY MOUTH TWICE DAILY     Off-Protocol Failed - 01/23/2023  7:09 AM      Failed - Medication not assigned to a protocol, review manually.      Passed - Valid encounter within last 12 months    Recent Outpatient Visits           7 months ago Essential hypertension, benign   [facility] Bo Merino, FNP   1 year ago Essential hypertension, benign   [facility] Bo Merino, FNP   1 year ago Hypertriglyceridemia   [facility] Kathrine Haddock, NP   3 years ago GAD (generalized anxiety disorder)   [facility] Delsa Grana, PA-C   3 years ago Essential hypertension, benign   [place], NP

## 2023-02-03 NOTE — Progress Notes (Unsigned)
   Established Patient Office Visit  Subjective   Patient ID: Leonardo Koob, male    DOB: February 01, 1966  Age: 57 y.o. MRN: [id number]  No chief complaint on file.   HPI  Patient is here to follow up on chronic medical conditions.   Hypertension:  -Medications: Losartan 25 mg -Patient is compliant with above medications and reports no side effects. -Checking BP at home (average): *** -Highest BP at home: *** -Lowest BP at home: *** -Denies any SOB, CP, vision changes, LE edema or symptoms of hypotension -Diet: *** -Exercise: ***  HLD:  -Medications: Tricor 145 mg, Vascepa 2 g BID -Patient is compliant with above medications and reports no side effects. *** -Last lipid panel: Lipid Panel     Component Value Date/Time   CHOL 370 (H) 06/22/2022 0858   CHOL 148 01/13/2016 1151   TRIG 1,916 (H) 06/22/2022 0858   HDL 34 (L) 06/22/2022 0858   HDL 32 (L) 01/13/2016 1151   CHOLHDL 10.9 (H) 06/22/2022 0858   VLDL NOT CALC 07/20/2016 0907   LDLCALC  06/22/2022 0858     Comment:     . LDL cholesterol not calculated. Triglyceride levels greater than 400 mg/dL invalidate calculated LDL results. . Reference range: <100 . Desirable range <100 mg/dL for primary prevention;   <70 mg/dL for patients with CHD or diabetic patients  with > or = 2 CHD risk factors. Marland Kitchen LDL-C is now calculated using the Martin-Hopkins  calculation, which is a validated novel method providing  better accuracy than the Friedewald equation in the  estimation of LDL-C.  Cresenciano Genre et al. Annamaria Helling. [id number]): 2061-2068  (http://education.[facility].com/faq/FAQ164)    LABVLDL Comment 01/13/2016 1151     Pre-Diabetes: -Last A1c 6.1 8/23 -Currently on Metformin 1000 mg XR once daily and Actos 30 mg   Anxiety:  -Currently on Lexapro 20 mg   {History (Optional):23778}  ROS    Objective:     There were no vitals taken for this visit. {Vitals History (Optional):23777}  Physical  Exam   No results found for any visits on 02/05/23.  {Labs (Optional):23779}  The ASCVD Risk score (Arnett DK, et al., 2019) failed to calculate for the following reasons:   The valid total cholesterol range is 130 to 320 mg/dL    Assessment & Plan:   Problem List Items Addressed This Visit   None   No follow-ups on file.    Teodora Medici, DO

## 2023-02-05 ENCOUNTER — Ambulatory Visit (INDEPENDENT_AMBULATORY_CARE_PROVIDER_SITE_OTHER): Payer: Medicaid Other | Admitting: Internal Medicine

## 2023-02-05 ENCOUNTER — Encounter: Payer: Self-pay | Admitting: Internal Medicine

## 2023-02-05 VITALS — BP 148/82 | HR 95 | Temp 98.0°F | Resp 16 | Ht 73.0 in | Wt 215.9 lb

## 2023-02-05 DIAGNOSIS — I10 Essential (primary) hypertension: Secondary | ICD-10-CM

## 2023-02-05 DIAGNOSIS — E785 Hyperlipidemia, unspecified: Secondary | ICD-10-CM | POA: Diagnosis not present

## 2023-02-05 DIAGNOSIS — R7303 Prediabetes: Secondary | ICD-10-CM | POA: Diagnosis not present

## 2023-02-05 DIAGNOSIS — F411 Generalized anxiety disorder: Secondary | ICD-10-CM | POA: Diagnosis not present

## 2023-02-05 LAB — POCT GLYCOSYLATED HEMOGLOBIN (HGB A1C): Hemoglobin A1C: 5.2 % (ref 4.0–5.6)

## 2023-02-05 MED ORDER — ESCITALOPRAM OXALATE 20 MG PO TABS: 20.0000 mg | ORAL_TABLET | Freq: Every day | ORAL | 1 refills | Status: DC

## 2023-02-05 MED ORDER — METFORMIN HCL ER 500 MG PO TB24: 1000.0000 mg | ORAL_TABLET | Freq: Every day | ORAL | 3 refills | Status: DC

## 2023-02-05 MED ORDER — FENOFIBRATE 145 MG PO TABS: 145.0000 mg | ORAL_TABLET | Freq: Every day | ORAL | 1 refills | Status: DC

## 2023-02-05 MED ORDER — ADULT BLOOD PRESSURE CUFF LG KIT: 1.0000 | PACK | Freq: Every day | 0 refills | Status: DC

## 2023-02-05 MED ORDER — ICOSAPENT ETHYL 1 G PO CAPS: ORAL_CAPSULE | ORAL | 1 refills | Status: DC

## 2023-02-05 MED ORDER — LOSARTAN POTASSIUM 50 MG PO TABS: 50.0000 mg | ORAL_TABLET | Freq: Every day | ORAL | 1 refills | Status: DC

## 2023-05-06 ENCOUNTER — Telehealth: Payer: Self-pay

## 2023-05-06 NOTE — Telephone Encounter (Signed)
PA started on vascepa through cover my meds on 05/06/23

## 2023-05-06 NOTE — Telephone Encounter (Signed)
Chris from [employer] called in wanting to know why patient can't cont taking the brand name of vascepa , since it is on the preferred list for medicaid and the generic is non-preferred

## 2023-05-07 NOTE — Telephone Encounter (Signed)
Left message for patient to call office.  

## 2023-05-07 NOTE — Telephone Encounter (Signed)
Left Jesus Barber a message to call me back regarding patients medication

## 2023-05-08 NOTE — Telephone Encounter (Signed)
Please re write for brand name

## 2023-05-09 ENCOUNTER — Other Ambulatory Visit: Payer: Self-pay | Admitting: Internal Medicine

## 2023-05-09 DIAGNOSIS — E785 Hyperlipidemia, unspecified: Secondary | ICD-10-CM

## 2023-05-09 MED ORDER — ICOSAPENT ETHYL 1 G PO CAPS: 2.0000 g | ORAL_CAPSULE | Freq: Two times a day (BID) | ORAL | 2 refills | Status: DC

## 2023-07-30 ENCOUNTER — Other Ambulatory Visit: Payer: Self-pay | Admitting: Internal Medicine

## 2023-07-30 DIAGNOSIS — E785 Hyperlipidemia, unspecified: Secondary | ICD-10-CM

## 2023-07-30 DIAGNOSIS — I10 Essential (primary) hypertension: Secondary | ICD-10-CM

## 2023-07-31 NOTE — Telephone Encounter (Signed)
Requested medication (s) are due for refill today: yes  Requested medication (s) are on the active medication list:yes  Last refill:  02/05/23 #90 1 RF  Future visit scheduled:yes  Notes to clinic:  overdue lab work    Requested Prescriptions  Pending Prescriptions Disp Refills   fenofibrate (TRICOR) 145 MG tablet [Pharmacy Med Name: FENOFIBRATE 145MG  TABLETS] 90 tablet 1    Sig: TAKE 1 TABLET(145 MG) BY MOUTH DAILY     Cardiovascular:  Antilipid - Fibric Acid Derivatives Failed - 07/30/2023  7:01 AM      Failed - ALT in normal range and within 360 days    ALT  Date Value Ref Range Status  06/22/2022 77 (H) 9 - 46 U/L Final   ALT (SGPT) Piccolo, Waived  Date Value Ref Range Status  01/13/2016 51 (H) 10 - 47 U/L Final         Failed - AST in normal range and within 360 days    AST  Date Value Ref Range Status  06/22/2022 44 (H) 10 - 35 U/L Final   AST (SGOT) Piccolo, Waived  Date Value Ref Range Status  01/13/2016 46 (H) 11 - 38 U/L Final         Failed - Cr in normal range and within 360 days    Creat  Date Value Ref Range Status  06/22/2022 1.19 0.70 - 1.30 mg/dL Final         Failed - HGB in normal range and within 360 days    Hemoglobin  Date Value Ref Range Status  06/22/2022 15.6 13.2 - 17.1 g/dL Final         Failed - HCT in normal range and within 360 days    HCT  Date Value Ref Range Status  06/22/2022 44.0 38.5 - 50.0 % Final         Failed - PLT in normal range and within 360 days    Platelets  Date Value Ref Range Status  06/22/2022 192 140 - 400 Thousand/uL Final         Failed - WBC in normal range and within 360 days    WBC  Date Value Ref Range Status  06/22/2022 4.6 3.8 - 10.8 Thousand/uL Final         Failed - eGFR is 30 or above and within 360 days    GFR, Est African American  Date Value Ref Range Status  08/21/2019 78 > OR = 60 mL/min/1.62m2 Final   GFR, Est Non African American  Date Value Ref Range Status  08/21/2019 67 >  OR = 60 mL/min/1.29m2 Final   eGFR  Date Value Ref Range Status  06/22/2022 73 > OR = 60 mL/min/1.34m2 Final         Failed - Lipid Panel in normal range within the last 12 months    Cholesterol, Total  Date Value Ref Range Status  01/13/2016 148 100 - 199 mg/dL Final   Cholesterol  Date Value Ref Range Status  06/22/2022 370 (H) <200 mg/dL Final   LDL Cholesterol (Calc)  Date Value Ref Range Status  06/22/2022  mg/dL (calc) Final    Comment:    . LDL cholesterol not calculated. Triglyceride levels greater than 400 mg/dL invalidate calculated LDL results. . Reference range: <100 . Desirable range <100 mg/dL for primary prevention;   <70 mg/dL for patients with CHD or diabetic patients  with > or = 2 CHD risk factors. Marland Kitchen LDL-C is now calculated using  the Martin-Hopkins  calculation, which is a validated novel method providing  better accuracy than the Friedewald equation in the  estimation of LDL-C.  Horald Pollen et al. Lenox Ahr. 0102;725(36): 2061-2068  (http://education.[facility].com/faq/FAQ164)    HDL  Date Value Ref Range Status  06/22/2022 34 (L) > OR = 40 mg/dL Final  64/40/3474 32 (L) >39 mg/dL Final   Triglycerides  Date Value Ref Range Status  06/22/2022 1,916 (H) <150 mg/dL Final    Comment:    Verified by repeat analysis. Marland Kitchen . If a non-fasting specimen was collected, consider repeat triglyceride testing on a fasting specimen if clinically indicated.  Perry Mount et al. J. of Clin. Lipidol. 2015;9:129-169. . . There is increased risk of pancreatitis when the  triglyceride concentration is very high  (> or = 500 mg/dL, especially if > or = 2595 mg/dL).  Perry Mount et al. J. of Clin. Lipidol. 2015;9:129-169. Marland Kitchen          Passed - Valid encounter within last 12 months    Recent Outpatient Visits           5 months ago Essential hypertension, benign   [place] [facility] Margarita Mail, DO   1 year ago Essential hypertension,  benign   [place] [facility] Berniece Salines, FNP   1 year ago Essential hypertension, benign   [facility] Berniece Salines, FNP   2 years ago Hypertriglyceridemia   [facility] Gabriel Cirri, NP   3 years ago GAD (generalized anxiety disorder)   [place] [facility] Danelle Berry, PA-C       Future Appointments             In 1 week Zane Herald, Rudolpho Sevin, FNP [facility], [facility]             losartan (COZAAR) 50 MG tablet [Pharmacy Med Name: LOSARTAN 50MG  TABLETS] 90 tablet 1    Sig: TAKE 1 TABLET BY MOUTH DAILY     Cardiovascular:  Angiotensin Receptor Blockers Failed - 07/30/2023  7:01 AM      Failed - Cr in normal range and within 180 days    Creat  Date Value Ref Range Status  06/22/2022 1.19 0.70 - 1.30 mg/dL Final         Failed - K in normal range and within 180 days    Potassium  Date Value Ref Range Status  06/22/2022 4.1 3.5 - 5.3 mmol/L Final         Failed - Last BP in normal range    BP Readings from Last 1 Encounters:  02/05/23 (!) 148/82         Passed - Patient is not pregnant      Passed - Valid encounter within last 6 months    Recent Outpatient Visits           5 months ago Essential hypertension, benign   [facility] Margarita Mail, DO   1 year ago Essential hypertension, benign   [facility] Berniece Salines, FNP   1 year ago Essential hypertension, benign   [facility] Berniece Salines, FNP   2 years ago Hypertriglyceridemia   [facility] Gabriel Cirri, NP   3 years ago GAD (generalized anxiety disorder)   [facility] Danelle Berry, [place]       Future Appointments  In 1 week Zane Herald, Rudolpho Sevin, FNP [facility], [facility]

## 2023-08-07 NOTE — Progress Notes (Deleted)
There were no vitals taken for this visit.   Subjective:    Patient ID: Jesus Barber, male    DOB: September 25, 1966, 57 y.o.   MRN: [id number]  HPI: Jesus Barber is a 57 y.o. male  No chief complaint on file.  Hypertension:  -Medications: losartan 50 mg daily -Patient is compliant with above medications and reports no side effects. -Checking BP at home (average): does not check blood pressure at home.   -Denies any SOB, CP, vision changes, LE edema or symptoms of hypotension -Diet: recommend DASH diet  -Exercise: recommend 150 min of physical activity weekly     HLD:  -Medications: fenofibrate 145 mg daily, vascepa 2 g BID -Patient is compliant with above medications and reports no side effects.  -Last lipid panel:  Lipid Panel     Component Value Date/Time   CHOL 370 (H) 06/22/2022 0858   CHOL 148 01/13/2016 1151   TRIG 1,916 (H) 06/22/2022 0858   HDL 34 (L) 06/22/2022 0858   HDL 32 (L) 01/13/2016 1151   CHOLHDL 10.9 (H) 06/22/2022 0858   VLDL NOT CALC 07/20/2016 0907   LDLCALC  06/22/2022 0858     Comment:     . LDL cholesterol not calculated. Triglyceride levels greater than 400 mg/dL invalidate calculated LDL results. . Reference range: <100 . Desirable range <100 mg/dL for primary prevention;   <70 mg/dL for patients with CHD or diabetic patients  with > or = 2 CHD risk factors. Marland Kitchen LDL-C is now calculated using the Martin-Hopkins  calculation, which is a validated novel method providing  better accuracy than the Friedewald equation in the  estimation of LDL-C.  Horald Pollen et al. Lenox Ahr. 8657;846(96): 2061-2068  (http://education.[facility].com/faq/FAQ164)    LABVLDL Comment 01/13/2016 1151     Last office visit had a long discussion about his triglycerides.  Patient currently drinks approximately 2 beers a day except on weekends and drinks over a sixpack.  Discussed decreasing alcohol consumption, red meat and fried foods.  Prediabetes: last  A1C was 5.2 on 02/05/2023, prior to that it was 5.8.  patient is currently on metformin 1000 mg daily  anxiety Medication lexapro 20 mg daily Compliant yes Side effects none PHQ9 negative GAD negative     02/05/2023    8:58 AM 06/22/2022    8:29 AM 12/26/2021    3:24 PM 05/04/2021    3:39 PM  GAD 7 : Generalized Anxiety Score  Nervous, Anxious, on Edge 0 0 0 0  Control/stop worrying 0 0 0 0  Worry too much - different things 0 0 0 0  Trouble relaxing 0 0 0 0  Restless 0 0 0 0  Easily annoyed or irritable 1 0 0 1  Afraid - awful might happen 0 0 0 0  Total GAD 7 Score 1 0 0 1  Anxiety Difficulty Not difficult at all Not difficult at all Not difficult at all Not difficult at all        02/05/2023    8:56 AM 06/22/2022    8:28 AM 12/26/2021    3:24 PM 05/04/2021    3:38 PM 08/21/2019   10:20 AM  Depression screen PHQ 2/9  Decreased Interest 0 0 0 0 0  Down, Depressed, Hopeless 0 0 0 0 0  PHQ - 2 Score 0 0 0 0 0  Altered sleeping 1 0 0 1 0  Tired, decreased energy 0 0 0 1 0  Change in appetite 0 0 0 0 0  Feeling bad or failure about yourself  0 0 0 0 0  Trouble concentrating 0 0 0 0 0  Moving slowly or fidgety/restless 0 0 0 0 0  Suicidal thoughts 0 0 0 0 0  PHQ-9 Score 1 0 0 2 0  Difficult doing work/chores Not difficult at all Not difficult at all Not difficult at all Not difficult at all Not difficult at all    Obesity:  Current weight : *** BMI: *** Previous weight:*** Treatment Tried: lifestyle modification Comorbidities: prediabetes, htn, hld, anxiety   Relevant past medical, surgical, family and social history reviewed and updated as indicated. Interim medical history since our last visit reviewed. Allergies and medications reviewed and updated.  Review of Systems  Constitutional: Negative for fever or weight change.  Respiratory: Negative for cough and shortness of breath.   Cardiovascular: Negative for chest pain or palpitations.  Gastrointestinal: Negative for  abdominal pain, no bowel changes.  Musculoskeletal: Negative for gait problem or joint swelling.  Skin: Negative for rash.  Neurological: Negative for dizziness or headache.  No other specific complaints in a complete review of systems (except as listed in HPI above).      Objective:    There were no vitals taken for this visit.  Wt Readings from Last 3 Encounters:  02/05/23 215 lb 14.4 oz (97.9 kg)  06/22/22 235 lb 9.6 oz (106.9 kg)  01/26/22 230 lb (104.3 kg)    Physical Exam  Constitutional: Patient appears well-developed and well-nourished. Obese  No distress.  HEENT: head atraumatic, normocephalic, pupils equal and reactive to light,  neck supple Cardiovascular: Normal rate, regular rhythm and normal heart sounds.  No murmur heard. No BLE edema. Pulmonary/Chest: Effort normal and breath sounds normal. No respiratory distress. Abdominal: Soft.  There is no tenderness. Psychiatric: Patient has a normal mood and affect. behavior is normal. Judgment and thought content normal.  Results for orders placed or performed in visit on 02/05/23  POCT HgB A1C  Result Value Ref Range   Hemoglobin A1C 5.2 4.0 - 5.6 %   HbA1c POC (<> result, manual entry)     HbA1c, POC (prediabetic range)     HbA1c, POC (controlled diabetic range)        Assessment & Plan:   Problem List Items Addressed This Visit   None     Follow up plan: No follow-ups on file.

## 2023-08-08 ENCOUNTER — Ambulatory Visit: Payer: Medicaid Other | Admitting: Nurse Practitioner

## 2023-08-10 NOTE — Patient Instructions (Signed)

## 2023-08-14 ENCOUNTER — Ambulatory Visit: Payer: Medicaid Other | Admitting: Nurse Practitioner

## 2023-08-14 ENCOUNTER — Encounter: Payer: Self-pay | Admitting: Nurse Practitioner

## 2023-08-14 VITALS — BP 142/88 | HR 82 | Temp 98.5°F | Ht 72.1 in | Wt 209.8 lb

## 2023-08-14 DIAGNOSIS — E781 Pure hyperglyceridemia: Secondary | ICD-10-CM

## 2023-08-14 DIAGNOSIS — F411 Generalized anxiety disorder: Secondary | ICD-10-CM | POA: Diagnosis not present

## 2023-08-14 DIAGNOSIS — Z7689 Persons encountering health services in other specified circumstances: Secondary | ICD-10-CM

## 2023-08-14 DIAGNOSIS — I10 Essential (primary) hypertension: Secondary | ICD-10-CM

## 2023-08-14 DIAGNOSIS — E785 Hyperlipidemia, unspecified: Secondary | ICD-10-CM

## 2023-08-14 DIAGNOSIS — R7303 Prediabetes: Secondary | ICD-10-CM

## 2023-08-14 DIAGNOSIS — N400 Enlarged prostate without lower urinary tract symptoms: Secondary | ICD-10-CM

## 2023-08-14 LAB — MICROALBUMIN, URINE WAIVED
Creatinine, Urine Waived: 300 mg/dL (ref 10–300)
Microalb, Ur Waived: 30 mg/L — ABNORMAL HIGH (ref 0–19)
Microalb/Creat Ratio: <30 mg/g (ref <30)

## 2023-08-14 MED ORDER — ICOSAPENT ETHYL 1 G PO CAPS
2.0000 g | ORAL_CAPSULE | Freq: Two times a day (BID) | ORAL | 4 refills | Status: AC
Start: 2023-08-14 — End: ?

## 2023-08-14 MED ORDER — ESCITALOPRAM OXALATE 10 MG PO TABS: 10.0000 mg | ORAL_TABLET | Freq: Every day | ORAL | 4 refills | Status: DC

## 2023-08-14 NOTE — Assessment & Plan Note (Signed)
Chronic, ongoing.  Elevations above goal today, but just found out daughter got in her first fender bender.  Will maintain Losartan at 50 MG at this time and plan on return in 4 weeks, if still elevated then will increase to 100 MG.  Discussed with patient today.  Recommend he monitor BP at least a few mornings a week at home and document.  DASH diet at home.  Labs today: CBC, CMP, TSH, urine ALB.  Return in 4 weeks.

## 2023-08-14 NOTE — Assessment & Plan Note (Signed)
Chronic, stable per patient.  Will restart Vascepa, had stopped due to past insurance not covering.  Recheck labs today and add additional medication as needed, was on others in past.  Focus on healthy diet at home.

## 2023-08-14 NOTE — Assessment & Plan Note (Signed)
Chronic, stable.  Denies SI/HI.  Has been out of medication for one week, would like to eventually come off of this.  Will trial restarting Lexapro at lower dose 10 MG daily and see if tolerates and less irritability.  Could consider further reduction in future.  We discussed if his wife reports he is more irritable on 10 MG we can go back up to 20 MG as needed.

## 2023-08-14 NOTE — Assessment & Plan Note (Signed)
Last A1c in March 5.2%, has been off Metformin.  Will recheck today and continue focus on diet at home.  Restart medication as needed.

## 2023-08-14 NOTE — Progress Notes (Signed)
New Patient Office Visit  Subjective    Patient ID: Jesus Barber, male    DOB: [birth year]  Age: 57 y.o. MRN: [id number]  CC:  Chief Complaint  Patient presents with   Establish Care    Requesting lab work if possible- cholesterol and glucose, was on Metformin and Fenofibrate previously   Hypertension   IFG    HPI Jesus Barber presents for new patient visit to establish care.  Introduced to Publishing rights manager role and practice setting.  All questions answered.  Discussed provider/patient relationship and expectations.  HYPERTENSION / HYPERLIPIDEMIA Taking Losartan 50 MG daily.  History of elevated triglycerides, he reports very high -- did take medication for this at time.  Endocrinologist had him on Metformin, Vascepa, and Fenofibrate -- had some prediabetes at one time. Last A1c March 2024 - 5.2%. They stopped his Metformin.  He has not had labs checked, lipid panel, since having all medications stopped. Satisfied with current treatment? yes Duration of hypertension: chronic BP monitoring frequency: not checking BP range:  BP medication side effects: no Duration of hyperlipidemia: chronic Cholesterol medication side effects: no Cholesterol supplements: none Medication compliance: good compliance Aspirin: no Recent stressors: no Recurrent headaches: no Visual changes: no Palpitations: no Dyspnea: no Chest pain: no Lower extremity edema: no Dizzy/lightheaded: no   ANXIETY/STRESS Taking Lexapro 20 MG daily, has been out of this for one week.  Would like to try lowering. Duration:stable Anxious mood: yes occasional Excessive worrying: no Irritability: sometimes Sweating: no Nausea: no Palpitations:no Hyperventilation: no Panic attacks: no Agoraphobia: no  Obscessions/compulsions: no Depressed mood: no    17-Aug-2023    3:23 PM 02/05/2023    8:56 AM 06/22/2022    8:28 AM 12/26/2021    3:24 PM 05/04/2021    3:38 PM  Depression screen PHQ 2/9  Decreased  Interest 0 0 0 0 0  Down, Depressed, Hopeless 0 0 0 0 0  PHQ - 2 Score 0 0 0 0 0  Altered sleeping 1 1 0 0 1  Tired, decreased energy 1 0 0 0 1  Change in appetite 0 0 0 0 0  Feeling bad or failure about yourself  0 0 0 0 0  Trouble concentrating 0 0 0 0 0  Moving slowly or fidgety/restless 0 0 0 0 0  Suicidal thoughts 0 0 0 0 0  PHQ-9 Score 2 1 0 0 2  Difficult doing work/chores Not difficult at all Not difficult at all Not difficult at all Not difficult at all Not difficult at all  Anhedonia: no Weight changes: no Insomnia: yes hard to fall asleep due to sleep schedule Hypersomnia: no Fatigue/loss of energy: no Feelings of worthlessness: no Feelings of guilt: no Impaired concentration/indecisiveness: no Suicidal ideations: no  Crying spells: no Recent Stressors/Life Changes: no   Relationship problems: no   Family stress: no     Financial stress: no    Job stress: no    Recent death/loss: no     17-Aug-2023    3:22 PM 02/05/2023    8:58 AM 06/22/2022    8:29 AM 12/26/2021    3:24 PM  GAD 7 : Generalized Anxiety Score  Nervous, Anxious, on Edge 0 0 0 0  Control/stop worrying 0 0 0 0  Worry too much - different things 0 0 0 0  Trouble relaxing 0 0 0 0  Restless 0 0 0 0  Easily annoyed or irritable 1 1 0 0  Afraid - awful might  happen 0 0 0 0  Total GAD 7 Score 1 1 0 0  Anxiety Difficulty Not difficult at all Not difficult at all Not difficult at all Not difficult at all   Outpatient Encounter Medications as of 08/14/2023  Medication Sig   escitalopram (LEXAPRO) 10 MG tablet Take 1 tablet (10 mg total) by mouth daily.   losartan (COZAAR) 50 MG tablet TAKE 1 TABLET BY MOUTH DAILY   [DISCONTINUED] escitalopram (LEXAPRO) 20 MG tablet Take 1 tablet (20 mg total) by mouth daily.   icosapent Ethyl (VASCEPA) 1 g capsule Take 2 capsules (2 g total) by mouth 2 (two) times daily.   [DISCONTINUED] Blood Pressure Monitoring (ADULT BLOOD PRESSURE CUFF LG) KIT 1 each by Does not apply  route daily.   [DISCONTINUED] fenofibrate (TRICOR) 145 MG tablet TAKE 1 TABLET(145 MG) BY MOUTH DAILY   [DISCONTINUED] icosapent Ethyl (VASCEPA) 1 g capsule Take 2 capsules (2 g total) by mouth 2 (two) times daily.   [DISCONTINUED] metFORMIN (GLUCOPHAGE-XR) 500 MG 24 hr tablet Take 2 tablets (1,000 mg total) by mouth daily.   [DISCONTINUED] pioglitazone (ACTOS) 30 MG tablet Take by mouth.   No facility-administered encounter medications on file as of 08/14/2023.    Past Medical History:  Diagnosis Date   Ankle fracture    Back pain    Herniated Disk   Chronic kidney disease    Elevated liver enzymes    Fatty liver    High triglycerides 08/21/2019   High triglycerides    Hyperlipidemia    Hypertension    Hypogonadism in male    IFG (impaired fasting glucose)     Past Surgical History:  Procedure Laterality Date   BACK SURGERY     Discectomy   COLONOSCOPY WITH PROPOFOL N/A 01/26/2022   Procedure: COLONOSCOPY WITH PROPOFOL;  Surgeon: Toney Reil, MD;  Location: [facility] ENDOSCOPY;  Service: Gastroenterology;  Laterality: N/A;    Family History  Problem Relation Age of Onset   Cancer Father        lung   Alzheimer's disease Father    Heart disease Brother    Hyperlipidemia Paternal Grandmother     Social History   Socioeconomic History   Marital status: Married    Spouse name: Ladean Raya   Number of children: 1   Years of education: Not on file   Highest education level: High school graduate  Occupational History   Not on file  Tobacco Use   Smoking status: Former    Current packs/day: 0.00    Types: Cigarettes    Start date: 11/19/1978    Quit date: 11/20/2003    Years since quitting: 19.7   Smokeless tobacco: Former  Building services engineer status: Never Used  Substance and Sexual Activity   Alcohol use: Yes    Comment: on occasion    Drug use: No   Sexual activity: Yes    Partners: Female  Other Topics Concern   Not on file  Social History Narrative    Not on file   Social Determinants of Health   Financial Resource Strain: Low Risk  (08/14/2023)   Overall Financial Resource Strain (CARDIA)    Difficulty of Paying Living Expenses: Not very hard  Food Insecurity: No Food Insecurity (08/14/2023)   Hunger Vital Sign    Worried About Running Out of Food in the Last Year: Never true    Ran Out of Food in the Last Year: Never true  Transportation Needs: No Transportation Needs (  05/04/2021)   PRAPARE - Administrator, Civil Service (Medical): No    Lack of Transportation (Non-Medical): No  Physical Activity: Sufficiently Active (08/14/2023)   Exercise Vital Sign    Days of Exercise per Week: 5 days    Minutes of Exercise per Session: 30 min  Stress: No Stress Concern Present (08/14/2023)   [employer] of Occupational Health - Occupational Stress Questionnaire    Feeling of Stress : Not at all  Social Connections: Moderately Isolated (08/14/2023)   Social Connection and Isolation Panel [NHANES]    Frequency of Communication with Friends and Family: Three times a week    Frequency of Social Gatherings with Friends and Family: Three times a week    Attends Religious Services: Never    Active Member of Clubs or Organizations: No    Attends Banker Meetings: Never    Marital Status: Married  Catering manager Violence: Not At Risk (08/14/2023)   Humiliation, Afraid, Rape, and Kick questionnaire    Fear of Current or Ex-Partner: No    Emotionally Abused: No    Physically Abused: No    Sexually Abused: No    Review of Systems  Constitutional:  Negative for chills, diaphoresis, fever and weight loss.  Respiratory:  Negative for cough, shortness of breath and wheezing.   Cardiovascular:  Negative for chest pain, palpitations, orthopnea and leg swelling.  Gastrointestinal: Negative.   Neurological: Negative.   Endo/Heme/Allergies: Negative.   Psychiatric/Behavioral:  Negative for depression, substance abuse and  suicidal ideas. The patient is nervous/anxious and has insomnia.       Objective    BP (!) 142/88 (BP Location: Left Arm, Patient Position: Sitting, Cuff Size: Normal)   Pulse 82   Temp 98.5 F (36.9 C) (Oral)   Ht 6' 0.1" (1.831 m)   Wt 209 lb 12.8 oz (95.2 kg)   SpO2 96%   BMI 28.38 kg/m   Physical Exam Vitals and nursing note reviewed.  Constitutional:      General: He is awake. He is not in acute distress.    Appearance: He is well-developed and well-groomed. He is not ill-appearing or toxic-appearing.  HENT:     Head: Normocephalic.     Right Ear: Hearing and external ear normal.     Left Ear: Hearing and external ear normal.  Eyes:     General: Lids are normal.     Extraocular Movements: Extraocular movements intact.     Conjunctiva/sclera: Conjunctivae normal.  Neck:     Thyroid: No thyromegaly.     Vascular: No carotid bruit.  Cardiovascular:     Rate and Rhythm: Normal rate and regular rhythm.     Heart sounds: Normal heart sounds. No murmur heard.    No gallop.  Pulmonary:     Effort: No accessory muscle usage or respiratory distress.     Breath sounds: Normal breath sounds.  Abdominal:     General: Bowel sounds are normal. There is no distension.     Palpations: Abdomen is soft.     Tenderness: There is no abdominal tenderness.  Musculoskeletal:     Cervical back: Full passive range of motion without pain.     Right lower leg: No edema.     Left lower leg: No edema.  Lymphadenopathy:     Cervical: No cervical adenopathy.  Skin:    General: Skin is warm.     Capillary Refill: Capillary refill takes less than 2 seconds.  Neurological:  Mental Status: He is alert and oriented to person, place, and time.     Deep Tendon Reflexes: Reflexes are normal and symmetric.     Reflex Scores:      Brachioradialis reflexes are 2+ on the right side and 2+ on the left side.      Patellar reflexes are 2+ on the right side and 2+ on the left side. Psychiatric:         Attention and Perception: Attention normal.        Mood and Affect: Mood normal.        Speech: Speech normal.        Behavior: Behavior normal. Behavior is cooperative.        Thought Content: Thought content normal.    Last CBC Lab Results  Component Value Date   WBC 4.6 06/22/2022   HGB 15.6 06/22/2022   HCT 44.0 06/22/2022   MCV 89.4 06/22/2022   MCH 31.7 06/22/2022   RDW 12.3 06/22/2022   PLT 192 06/22/2022   Last metabolic panel Lab Results  Component Value Date   GLUCOSE 145 (H) 06/22/2022   NA 138 06/22/2022   K 4.1 06/22/2022   CL 101 06/22/2022   CO2 26 06/22/2022   BUN 17 06/22/2022   CREATININE 1.19 06/22/2022   EGFR 73 06/22/2022   CALCIUM 10.0 06/22/2022   PROT 7.4 06/22/2022   ALBUMIN 3.8 05/22/2018   LABGLOB 2.1 08/12/2015   AGRATIO 2.2 08/12/2015   BILITOT 0.7 06/22/2022   ALKPHOS 109 05/22/2018   AST 44 (H) 06/22/2022   ALT 77 (H) 06/22/2022   ANIONGAP 12 05/22/2018   Last lipids Lab Results  Component Value Date   CHOL 370 (H) 06/22/2022   HDL 34 (L) 06/22/2022   LDLCALC  06/22/2022     Comment:     . LDL cholesterol not calculated. Triglyceride levels greater than 400 mg/dL invalidate calculated LDL results. . Reference range: <100 . Desirable range <100 mg/dL for primary prevention;   <70 mg/dL for patients with CHD or diabetic patients  with > or = 2 CHD risk factors. Marland Kitchen LDL-C is now calculated using the Martin-Hopkins  calculation, which is a validated novel method providing  better accuracy than the Friedewald equation in the  estimation of LDL-C.  Horald Pollen et al. Lenox Ahr. 1914;782(95): 2061-2068  (http://education.[facility].com/faq/FAQ164)    TRIG 1,916 (H) 06/22/2022   CHOLHDL 10.9 (H) 06/22/2022   Last hemoglobin A1c Lab Results  Component Value Date   HGBA1C 5.2 02/05/2023   Last thyroid functions Lab Results  Component Value Date   TSH 1.79 05/04/2021       Assessment & Plan:   Problem List Items  Addressed This Visit       Cardiovascular and Mediastinum   Essential hypertension, benign - Primary (Chronic)    Chronic, ongoing.  Elevations above goal today, but just found out daughter got in her first fender bender.  Will maintain Losartan at 50 MG at this time and plan on return in 4 weeks, if still elevated then will increase to 100 MG.  Discussed with patient today.  Recommend he monitor BP at least a few mornings a week at home and document.  DASH diet at home.  Labs today: CBC, CMP, TSH, urine ALB.  Return in 4 weeks.       Relevant Medications   icosapent Ethyl (VASCEPA) 1 g capsule   Other Relevant Orders   CBC with Differential/Platelet   Comprehensive metabolic panel  TSH     Other   Dyslipidemia (Chronic)    Chronic, stable per patient.  Will restart Vascepa, had stopped due to past insurance not covering.  Recheck labs today and add additional medication as needed, was on others in past.  Focus on healthy diet at home.      Relevant Medications   icosapent Ethyl (VASCEPA) 1 g capsule   Other Relevant Orders   Comprehensive metabolic panel   Lipid Panel w/o Chol/HDL Ratio   Hypertriglyceridemia (Chronic)    Chronic, stable per patient.  Will restart Vascepa, had stopped due to past insurance not covering.  Recheck labs today and add additional medication as needed, was on others in past.  Focus on healthy diet at home.      Relevant Medications   icosapent Ethyl (VASCEPA) 1 g capsule   Other Relevant Orders   Comprehensive metabolic panel   Lipid Panel w/o Chol/HDL Ratio   GAD (generalized anxiety disorder)    Chronic, stable.  Denies SI/HI.  Has been out of medication for one week, would like to eventually come off of this.  Will trial restarting Lexapro at lower dose 10 MG daily and see if tolerates and less irritability.  Could consider further reduction in future.  We discussed if his wife reports he is more irritable on 10 MG we can go back up to 20 MG as  needed.        Relevant Medications   escitalopram (LEXAPRO) 10 MG tablet   Prediabetes    Last A1c in March 5.2%, has been off Metformin.  Will recheck today and continue focus on diet at home.  Restart medication as needed.      Relevant Orders   HgB A1c   Microalbumin, Urine Waived   Other Visit Diagnoses     Benign prostatic hyperplasia without lower urinary tract symptoms       PSA on labs today.   Relevant Orders   PSA   Encounter to establish care       New to clinic, introduced to provider and clinic setting.       Return in about 4 weeks (around 09/11/2023) for HTN -- may need to increase medication.   Marjie Skiff, NP

## 2023-08-15 ENCOUNTER — Other Ambulatory Visit: Payer: Self-pay | Admitting: Nurse Practitioner

## 2023-08-15 DIAGNOSIS — E781 Pure hyperglyceridemia: Secondary | ICD-10-CM

## 2023-08-15 LAB — COMPREHENSIVE METABOLIC PANEL
ALT: 32 IU/L (ref 0–44)
AST: 24 IU/L (ref 0–40)
Albumin: 4.5 g/dL (ref 3.8–4.9)
Alkaline Phosphatase: 91 IU/L (ref 44–121)
BUN/Creatinine Ratio: 14 (ref 9–20)
BUN: 13 mg/dL (ref 6–24)
Bilirubin Total: 0.5 mg/dL (ref 0.0–1.2)
CO2: 19 mmol/L — ABNORMAL LOW (ref 20–29)
Calcium: 9.4 mg/dL (ref 8.7–10.2)
Chloride: 98 mmol/L (ref 96–106)
Creatinine, Ser: 0.91 mg/dL (ref 0.76–1.27)
Globulin, Total: 2.4 g/dL (ref 1.5–4.5)
Glucose: 90 mg/dL (ref 70–99)
Potassium: 3.8 mmol/L (ref 3.5–5.2)
Sodium: 136 mmol/L (ref 134–144)
Total Protein: 6.9 g/dL (ref 6.0–8.5)
eGFR: 99 mL/min/{1.73_m2} (ref >59)

## 2023-08-15 LAB — CBC WITH DIFFERENTIAL/PLATELET
Basophils Absolute: 0 10*3/uL (ref 0.0–0.2)
Basos: 1 %
EOS (ABSOLUTE): 0.1 10*3/uL (ref 0.0–0.4)
Eos: 2 %
Hematocrit: 46.3 % (ref 37.5–51.0)
Hemoglobin: 16.5 g/dL (ref 13.0–17.7)
Immature Grans (Abs): 0 10*3/uL (ref 0.0–0.1)
Immature Granulocytes: 0 %
Lymphocytes Absolute: 1.9 10*3/uL (ref 0.7–3.1)
Lymphs: 36 %
MCH: 32.2 pg (ref 26.6–33.0)
MCHC: 35.6 g/dL (ref 31.5–35.7)
MCV: 90 fL (ref 79–97)
Monocytes Absolute: 0.5 10*3/uL (ref 0.1–0.9)
Monocytes: 9 %
Neutrophils Absolute: 2.7 10*3/uL (ref 1.4–7.0)
Neutrophils: 52 %
Platelets: 190 10*3/uL (ref 150–450)
RBC: 5.12 x10E6/uL (ref 4.14–5.80)
RDW: 12.1 % (ref 11.6–15.4)
WBC: 5.2 10*3/uL (ref 3.4–10.8)

## 2023-08-15 LAB — LIPID PANEL W/O CHOL/HDL RATIO
Cholesterol, Total: 492 mg/dL — ABNORMAL HIGH (ref 100–199)
HDL: 21 mg/dL — ABNORMAL LOW (ref >39)
Triglycerides: 2526 mg/dL (ref 0–149)

## 2023-08-15 LAB — HEMOGLOBIN A1C
Est. average glucose Bld gHb Est-mCnc: 117 mg/dL
Hgb A1c MFr Bld: 5.7 % — ABNORMAL HIGH (ref 4.8–5.6)

## 2023-08-15 LAB — PSA: Prostate Specific Ag, Serum: 0.4 ng/mL (ref 0.0–4.0)

## 2023-08-15 LAB — TSH: TSH: 3.3 u[IU]/mL (ref 0.450–4.500)

## 2023-08-15 MED ORDER — FENOFIBRATE 145 MG PO TABS: 145.0000 mg | ORAL_TABLET | Freq: Every day | ORAL | 4 refills | Status: DC

## 2023-08-15 NOTE — Progress Notes (Signed)
Contacted via MyChart -- needs fasting labs in 2 weeks   Good evening Scott, your labs have returned: - You definitely need to return to [facility] which I sent in, but I am also going to send in Fenofibrate to restart and would like to recheck labs first thing in morning fasting in 2 weeks. High triglycerides can place you at risk for pancreatitis and heart issues.  You were right about those labs.;) - Kidney function, creatinine and eGFR, remains normal, as is liver function, AST and ALT.  - A1c is 5.7%, which is in prediabetic range, continue focus on diet and exercise. - Remainder of labs look great.  Any questions? Keep being awesome!!  Thank you for allowing me to participate in your care.  I appreciate you. Kindest regards, Rolen Conger

## 2023-09-08 NOTE — Patient Instructions (Incomplete)

## 2023-09-12 ENCOUNTER — Ambulatory Visit: Payer: Medicaid Other | Admitting: Family Medicine

## 2023-09-12 VITALS — BP 146/93 | HR 75 | Temp 98.0°F | Ht 72.05 in | Wt 211.2 lb

## 2023-09-12 DIAGNOSIS — I10 Essential (primary) hypertension: Secondary | ICD-10-CM

## 2023-09-12 DIAGNOSIS — E781 Pure hyperglyceridemia: Secondary | ICD-10-CM | POA: Diagnosis not present

## 2023-09-12 DIAGNOSIS — Z23 Encounter for immunization: Secondary | ICD-10-CM

## 2023-09-12 NOTE — Progress Notes (Signed)
Subjective    Patient ID: Jesus Barber, male    DOB: 13-Nov-1966  Age: 57 y.o. MRN: [id number]  CC:  Chief Complaint  Patient presents with   Hypertension   HPI HYPERTENSION / HYPERLIPIDEMIA He remains taking Losartan 25 MG daily and will start taking 50 MG today, admits he is not checking BP at home. He does not add salt to foods, admits to increased vegetable intake, grilled foods over fried, and some physical activity during the week. He has restarted Fenofibrate 145 MG and Vascepa 1g daily over the last month. Triglycerides elevated at last lab check 1 month ago. Denies side effects from medications.   Satisfied with current treatment? yes Duration of hypertension: chronic BP monitoring frequency: not checking BP range:  BP medication side effects: no Duration of hyperlipidemia: chronic Cholesterol medication side effects: no Cholesterol supplements: none Medication compliance: good compliance Aspirin: no Recent stressors: no Recurrent headaches: no Visual changes: no Palpitations: no Dyspnea: no Chest pain: no Lower extremity edema: no Dizzy/lightheaded: no   Outpatient Encounter Medications as of 09/12/2023  Medication Sig   escitalopram (LEXAPRO) 10 MG tablet Take 1 tablet (10 mg total) by mouth daily.   fenofibrate (TRICOR) 145 MG tablet Take 1 tablet (145 mg total) by mouth daily.   icosapent Ethyl (VASCEPA) 1 g capsule Take 2 capsules (2 g total) by mouth 2 (two) times daily.   losartan (COZAAR) 50 MG tablet TAKE 1 TABLET BY MOUTH DAILY   No facility-administered encounter medications on file as of 09/12/2023.    Past Medical History:  Diagnosis Date   Ankle fracture    Back pain    Herniated Disk   Chronic kidney disease    Elevated liver enzymes    Fatty liver    High triglycerides 08/21/2019   High triglycerides    Hyperlipidemia    Hypertension    Hypogonadism in male    IFG (impaired fasting glucose)     Past Surgical History:   Procedure Laterality Date   BACK SURGERY     Discectomy   COLONOSCOPY WITH PROPOFOL N/A 01/26/2022   Procedure: COLONOSCOPY WITH PROPOFOL;  Surgeon: Toney Reil, MD;  Location: [facility] ENDOSCOPY;  Service: Gastroenterology;  Laterality: N/A;    Family History  Problem Relation Age of Onset   Cancer Father        lung   Alzheimer's disease Father    Heart disease Brother    Hyperlipidemia Paternal Grandmother     Social History   Socioeconomic History   Marital status: Married    Spouse name: Ladean Raya   Number of children: 1   Years of education: Not on file   Highest education level: High school graduate  Occupational History   Not on file  Tobacco Use   Smoking status: Former    Current packs/day: 0.00    Types: Cigarettes    Start date: 11/19/1978    Quit date: 11/20/2003    Years since quitting: 19.8   Smokeless tobacco: Former  Building services engineer status: Never Used  Substance and Sexual Activity   Alcohol use: Yes    Comment: on occasion    Drug use: No   Sexual activity: Yes    Partners: Female  Other Topics Concern   Not on file  Social History Narrative   Not on file   Social Determinants of Health   Financial Resource Strain: Low Risk  (08/14/2023)   Overall Physicist, medical Strain (  CARDIA)    Difficulty of Paying Living Expenses: Not very hard  Food Insecurity: No Food Insecurity (08/14/2023)   Hunger Vital Sign    Worried About Running Out of Food in the Last Year: Never true    Ran Out of Food in the Last Year: Never true  Transportation Needs: No Transportation Needs (05/04/2021)   PRAPARE - Administrator, Civil Service (Medical): No    Lack of Transportation (Non-Medical): No  Physical Activity: Sufficiently Active (08/14/2023)   Exercise Vital Sign    Days of Exercise per Week: 5 days    Minutes of Exercise per Session: 30 min  Stress: No Stress Concern Present (08/14/2023)   [employer] of Occupational Health -  Occupational Stress Questionnaire    Feeling of Stress : Not at all  Social Connections: Moderately Isolated (08/14/2023)   Social Connection and Isolation Panel [NHANES]    Frequency of Communication with Friends and Family: Three times a week    Frequency of Social Gatherings with Friends and Family: Three times a week    Attends Religious Services: Never    Active Member of Clubs or Organizations: No    Attends Banker Meetings: Never    Marital Status: Married  Catering manager Violence: Not At Risk (08/14/2023)   Humiliation, Afraid, Rape, and Kick questionnaire    Fear of Current or Ex-Partner: No    Emotionally Abused: No    Physically Abused: No    Sexually Abused: No    Review of Systems  Constitutional:  Negative for weight loss.  Respiratory: Negative.    Cardiovascular: Negative.  Negative for claudication.  Gastrointestinal:  Negative for abdominal pain.  Neurological:  Negative for dizziness, weakness and headaches.  Endo/Heme/Allergies: Negative.       Objective    BP (!) 146/93   Pulse 75   Temp 98 F (36.7 C) (Oral)   Ht 6' 0.05" (1.83 m)   Wt 211 lb 3.2 oz (95.8 kg)   SpO2 98%   BMI 28.61 kg/m   Physical Exam Vitals and nursing note reviewed.  Constitutional:      General: He is awake. He is not in acute distress.    Appearance: Normal appearance. He is well-developed and well-groomed. He is obese. He is not ill-appearing.  HENT:     Head: Normocephalic and atraumatic.     Right Ear: Hearing and external ear normal. No drainage.     Left Ear: Hearing and external ear normal. No drainage.     Nose: Nose normal.  Eyes:     General: Lids are normal.        Right eye: No discharge.        Left eye: No discharge.     Conjunctiva/sclera: Conjunctivae normal.  Cardiovascular:     Rate and Rhythm: Normal rate and regular rhythm.     Pulses:          Radial pulses are 2+ on the right side and 2+ on the left side.       Posterior tibial  pulses are 2+ on the right side and 2+ on the left side.     Heart sounds: Normal heart sounds, S1 normal and S2 normal. No murmur heard.    No gallop.  Pulmonary:     Effort: Pulmonary effort is normal. No accessory muscle usage or respiratory distress.     Breath sounds: Normal breath sounds.  Abdominal:     Tenderness: There is  no abdominal tenderness.  Musculoskeletal:        General: Normal range of motion.     Cervical back: Full passive range of motion without pain and normal range of motion.     Right lower leg: No edema.     Left lower leg: No edema.  Skin:    General: Skin is warm and dry.     Capillary Refill: Capillary refill takes less than 2 seconds.  Neurological:     Mental Status: He is alert and oriented to person, place, and time.  Psychiatric:        Attention and Perception: Attention normal.        Mood and Affect: Mood normal.        Speech: Speech normal.        Behavior: Behavior normal. Behavior is cooperative.        Thought Content: Thought content normal.    Last CBC Lab Results  Component Value Date   WBC 5.2 08/14/2023   HGB 16.5 08/14/2023   HCT 46.3 08/14/2023   MCV 90 08/14/2023   MCH 32.2 08/14/2023   RDW 12.1 08/14/2023   PLT 190 08/14/2023   Last metabolic panel Lab Results  Component Value Date   GLUCOSE 90 08/14/2023   NA 136 08/14/2023   K 3.8 08/14/2023   CL 98 08/14/2023   CO2 19 (L) 08/14/2023   BUN 13 08/14/2023   CREATININE 0.91 08/14/2023   EGFR 99 08/14/2023   CALCIUM 9.4 08/14/2023   PROT 6.9 08/14/2023   ALBUMIN 4.5 08/14/2023   LABGLOB 2.4 08/14/2023   AGRATIO 2.2 08/12/2015   BILITOT 0.5 08/14/2023   ALKPHOS 91 08/14/2023   AST 24 08/14/2023   ALT 32 08/14/2023   ANIONGAP 12 05/22/2018   Last lipids Lab Results  Component Value Date   CHOL 492 (H) 08/14/2023   HDL 21 (L) 08/14/2023   LDLCALC Comment (A) 08/14/2023   TRIG 2,526 (HH) 08/14/2023   CHOLHDL 10.9 (H) 06/22/2022   Last hemoglobin A1c Lab  Results  Component Value Date   HGBA1C 5.7 (H) 08/14/2023   Last thyroid functions Lab Results  Component Value Date   TSH 3.300 08/14/2023       Assessment & Plan:   Problem List Items Addressed This Visit     Essential hypertension, benign - Primary (Chronic)    Chronic, uncontrolled. CMP done today. BP elevated in office today 146/93. Currently still taking Losartan 25 MG, misunderstood to increase this dose to 50 MG. Educated to increase Losartan to 50 MG daily, check BP at home and bring log in at next visit. Provided instructions on DASH diet and recommended 150 mins of physical activity weekly. Return in 2 weeks. Call sooner if concerns arise.       Relevant Orders   Comprehensive metabolic panel   Hypertriglyceridemia (Chronic)    Chronic, uncontrolled. Lipid panel rechecked today. Continue with current regimen of Fenofibrate and Vascepa daily.       Relevant Orders   Comprehensive metabolic panel   Lipid Panel w/o Chol/HDL Ratio    Return in about 2 weeks (around 09/26/2023) for Follow up BP and triglycerides.   Weber Cooks, NP

## 2023-09-12 NOTE — Assessment & Plan Note (Signed)
Chronic, uncontrolled. Lipid panel rechecked today. Continue with current regimen of Fenofibrate and Vascepa daily.

## 2023-09-12 NOTE — Assessment & Plan Note (Addendum)
Chronic, uncontrolled. CMP done today. BP elevated in office today 146/93. Currently still taking Losartan 25 MG, misunderstood to increase this dose to 50 MG. Educated to increase Losartan to 50 MG daily, check BP at home and bring log in at next visit. Provided instructions on DASH diet and recommended 150 mins of physical activity weekly. Return in 2 weeks. Call sooner if concerns arise.

## 2023-09-13 LAB — COMPREHENSIVE METABOLIC PANEL
ALT: 28 [IU]/L (ref 0–44)
AST: 22 [IU]/L (ref 0–40)
Albumin: 4.8 g/dL (ref 3.8–4.9)
Alkaline Phosphatase: 82 [IU]/L (ref 44–121)
BUN/Creatinine Ratio: 13 (ref 9–20)
BUN: 14 mg/dL (ref 6–24)
Bilirubin Total: 0.6 mg/dL (ref 0.0–1.2)
CO2: 22 mmol/L (ref 20–29)
Calcium: 9.9 mg/dL (ref 8.7–10.2)
Chloride: 100 mmol/L (ref 96–106)
Creatinine, Ser: 1.1 mg/dL (ref 0.76–1.27)
Globulin, Total: 2.5 g/dL (ref 1.5–4.5)
Glucose: 93 mg/dL (ref 70–99)
Potassium: 3.9 mmol/L (ref 3.5–5.2)
Sodium: 138 mmol/L (ref 134–144)
Total Protein: 7.3 g/dL (ref 6.0–8.5)
eGFR: 79 mL/min/{1.73_m2} (ref >59)

## 2023-09-13 LAB — LIPID PANEL W/O CHOL/HDL RATIO
Cholesterol, Total: 371 mg/dL — ABNORMAL HIGH (ref 100–199)
HDL: 26 mg/dL — ABNORMAL LOW (ref >39)
Triglycerides: 1368 mg/dL (ref 0–149)

## 2023-09-13 NOTE — Progress Notes (Signed)
Hi Jesus Barber, your triglycerides remain elevated but are trending down with the treatment of Vascepa and Fenofibrate, continue taking medications as prescribed. Will recheck these levels at your next visit. Your electrolytes, kidney function, and liver function returned as normal. Thank you for allowing me to participate in your care.

## 2023-09-22 NOTE — Patient Instructions (Addendum)
Validate BP website  Start taking two of your 50 MG Losartan to = 100 MG. Then I will send a 100 MG pill to take once you have completed all your 50 MG.  Preventing High Cholesterol Cholesterol is a white, waxy substance similar to fat that the human body needs to help build cells. The liver makes all the cholesterol that a person's body needs. Having high cholesterol (hypercholesterolemia) increases your risk for heart disease and stroke. Extra or excess cholesterol comes from the food that you eat. High cholesterol can often be prevented with diet and lifestyle changes. If you already have high cholesterol, you can control it with diet, lifestyle changes, and medicines. How can high cholesterol affect me? If you have high cholesterol, fatty deposits (plaques) may build up on the walls of your blood vessels. The blood vessels that carry blood away from your heart are called arteries. Plaques make the arteries narrower and stiffer. This in turn can: Restrict or block blood flow and cause blood clots to form. Increase your risk for heart attack and stroke. What can increase my risk for high cholesterol? This condition is more likely to develop in people who: Eat foods that are high in saturated fat or cholesterol. Saturated fat is mostly found in foods that come from animal sources. Are overweight. Are not getting enough exercise. Use products that contain nicotine or tobacco, such as cigarettes, e-cigarettes, and chewing tobacco. Have a family history of high cholesterol (familial hypercholesterolemia). What actions can I take to prevent this? Nutrition  Eat less saturated fat. Avoid trans fats (partially hydrogenated oils). These are often found in margarine and in some baked goods, fried foods, and snacks bought in packages. Avoid precooked or cured meat, such as bacon, sausages, or meat loaves. Avoid foods and drinks that have added sugars. Eat more fruits, vegetables, and whole  grains. Choose healthy sources of protein, such as fish, poultry, lean cuts of red meat, beans, peas, lentils, and nuts. Choose healthy sources of fat, such as: Nuts. Vegetable oils, especially olive oil. Fish that have healthy fats, such as omega-3 fatty acids. These fish include mackerel or salmon. Lifestyle Lose weight if you are overweight. Maintaining a healthy body mass index (BMI) can help prevent or control high cholesterol. It can also lower your risk for diabetes and high blood pressure. Ask your health care provider to help you with a diet and exercise plan to lose weight safely. Do not use any products that contain nicotine or tobacco. These products include cigarettes, chewing tobacco, and vaping devices, such as e-cigarettes. If you need help quitting, ask your health care provider. Alcohol use Do not drink alcohol if: Your health care provider tells you not to drink. You are pregnant, may be pregnant, or are planning to become pregnant. If you drink alcohol: Limit how much you have to: 0-1 drink a day for women. 0-2 drinks a day for men. Know how much alcohol is in your drink. In the [place] one drink equals one 12 oz bottle of beer (355 mL), one 5 oz glass of wine (148 mL), or one 1 oz glass of hard liquor (44 mL). Activity  Get enough exercise. Do exercises as told by your health care provider. Each week, do at least 150 minutes of exercise that takes a medium level of effort (moderate-intensity exercise). This kind of exercise: Makes your heart beat faster while allowing you to still be able to talk. Can be done in short sessions several times a  day or longer sessions a few times a week. For example, on 5 days each week, you could walk fast or ride your bike 3 times a day for 10 minutes each time. Medicines Your health care provider may recommend medicines to help lower cholesterol. This may be a medicine to lower the amount of cholesterol that your liver makes. You may need  medicine if: Diet and lifestyle changes have not lowered your cholesterol enough. You have high cholesterol and other risk factors for heart disease or stroke. Take over-the-counter and prescription medicines only as told by your health care provider. General information Manage your risk factors for high cholesterol. Talk with your health care provider about all your risk factors and how to lower your risk. Manage other conditions that you have, such as diabetes or high blood pressure (hypertension). Have blood tests to check your cholesterol levels at regular points in time as told by your health care provider. Keep all follow-up visits. This is important. Where to find more information American Heart Association: www.heart.org National Heart, Lung, and Blood Institute: [employer].is Summary High cholesterol increases your risk for heart disease and stroke. By keeping your cholesterol level low, you can reduce your risk for these conditions. High cholesterol can often be prevented with diet and lifestyle changes. Work with your health care provider to manage your risk factors, and have your blood tested regularly. This information is not intended to replace advice given to you by your health care provider. Make sure you discuss any questions you have with your health care provider. Document Revised: 06/08/2022 Document Reviewed: 01/09/2021 Elsevier Patient Education  2024 [employer].

## 2023-09-27 ENCOUNTER — Encounter: Payer: Self-pay | Admitting: Nurse Practitioner

## 2023-09-27 ENCOUNTER — Ambulatory Visit: Payer: Medicaid Other | Admitting: Nurse Practitioner

## 2023-09-27 VITALS — BP 145/93 | HR 88 | Temp 98.1°F | Ht 72.0 in | Wt 211.8 lb

## 2023-09-27 DIAGNOSIS — E781 Pure hyperglyceridemia: Secondary | ICD-10-CM | POA: Diagnosis not present

## 2023-09-27 DIAGNOSIS — I10 Essential (primary) hypertension: Secondary | ICD-10-CM | POA: Diagnosis not present

## 2023-09-27 MED ORDER — LOSARTAN POTASSIUM 100 MG PO TABS: 100.0000 mg | ORAL_TABLET | Freq: Every day | ORAL | 4 refills | Status: DC

## 2023-09-27 NOTE — Progress Notes (Signed)
BP (!) 145/93 (BP Location: Right Arm, Patient Position: Sitting, Cuff Size: Large)   Pulse 88   Temp 98.1 F (36.7 C) (Oral)   Ht 6' (1.829 m)   Wt 211 lb 12.8 oz (96.1 kg)   SpO2 98%   BMI 28.73 kg/m    Subjective:    Patient ID: Jesus Barber, male    DOB: January 04, 1966, 57 y.o.   MRN: [id number]  HPI: Jesus Barber is a 57 y.o. male  Chief Complaint  Patient presents with   Follow-up   Blood Pressure Check   Hyperlipidemia    No new concerns today    HYPERTENSION without Chronic Kidney Disease & HLD Taking Losartan 50 MG daily + Vascepa/Fenofibrate. Hypertension status: uncontrolled  Satisfied with current treatment? yes Duration of hypertension: chronic BP monitoring frequency:  not checking BP range:  BP medication side effects:  no Medication compliance: good compliance Aspirin: no Recurrent headaches: no Visual changes: no Palpitations: no Dyspnea: no Chest pain: no Lower extremity edema: no Dizzy/lightheaded: no  Relevant past medical, surgical, family and social history reviewed and updated as indicated. Interim medical history since our last visit reviewed. Allergies and medications reviewed and updated.  Review of Systems  Constitutional:  Negative for activity change, diaphoresis, fatigue and fever.  Respiratory:  Negative for cough, chest tightness, shortness of breath and wheezing.   Cardiovascular:  Negative for chest pain, palpitations and leg swelling.  Gastrointestinal: Negative.   Neurological: Negative.   Psychiatric/Behavioral: Negative.     Per HPI unless specifically indicated above     Objective:    BP (!) 145/93 (BP Location: Right Arm, Patient Position: Sitting, Cuff Size: Large)   Pulse 88   Temp 98.1 F (36.7 C) (Oral)   Ht 6' (1.829 m)   Wt 211 lb 12.8 oz (96.1 kg)   SpO2 98%   BMI 28.73 kg/m   Wt Readings from Last 3 Encounters:  09/27/23 211 lb 12.8 oz (96.1 kg)  09/12/23 211 lb 3.2 oz (95.8 kg)   08/14/23 209 lb 12.8 oz (95.2 kg)    Physical Exam Vitals and nursing note reviewed.  Constitutional:      General: He is awake. He is not in acute distress.    Appearance: He is well-developed and well-groomed. He is not ill-appearing or toxic-appearing.  HENT:     Head: Normocephalic.     Right Ear: Hearing and external ear normal.     Left Ear: Hearing and external ear normal.  Eyes:     General: Lids are normal.     Extraocular Movements: Extraocular movements intact.     Conjunctiva/sclera: Conjunctivae normal.  Neck:     Thyroid: No thyromegaly.     Vascular: No carotid bruit.  Cardiovascular:     Rate and Rhythm: Normal rate and regular rhythm.     Heart sounds: Normal heart sounds. No murmur heard.    No gallop.  Pulmonary:     Effort: No accessory muscle usage or respiratory distress.     Breath sounds: Normal breath sounds.  Abdominal:     General: Bowel sounds are normal. There is no distension.     Palpations: Abdomen is soft.     Tenderness: There is no abdominal tenderness.  Musculoskeletal:     Cervical back: Full passive range of motion without pain.     Right lower leg: No edema.     Left lower leg: No edema.  Lymphadenopathy:     Cervical:  No cervical adenopathy.  Skin:    General: Skin is warm.     Capillary Refill: Capillary refill takes less than 2 seconds.  Neurological:     Mental Status: He is alert and oriented to person, place, and time.     Deep Tendon Reflexes: Reflexes are normal and symmetric.     Reflex Scores:      Brachioradialis reflexes are 2+ on the right side and 2+ on the left side.      Patellar reflexes are 2+ on the right side and 2+ on the left side. Psychiatric:        Attention and Perception: Attention normal.        Mood and Affect: Mood normal.        Speech: Speech normal.        Behavior: Behavior normal. Behavior is cooperative.        Thought Content: Thought content normal.    Results for orders placed or  performed in visit on 09/12/23  Comprehensive metabolic panel  Result Value Ref Range   Glucose 93 70 - 99 mg/dL   BUN 14 6 - 24 mg/dL   Creatinine, Ser 2.44 0.76 - 1.27 mg/dL   eGFR 79 >01 UU/VOZ/3.66   BUN/Creatinine Ratio 13 9 - 20   Sodium 138 134 - 144 mmol/L   Potassium 3.9 3.5 - 5.2 mmol/L   Chloride 100 96 - 106 mmol/L   CO2 22 20 - 29 mmol/L   Calcium 9.9 8.7 - 10.2 mg/dL   Total Protein 7.3 6.0 - 8.5 g/dL   Albumin 4.8 3.8 - 4.9 g/dL   Globulin, Total 2.5 1.5 - 4.5 g/dL   Bilirubin Total 0.6 0.0 - 1.2 mg/dL   Alkaline Phosphatase 82 44 - 121 IU/L   AST 22 0 - 40 IU/L   ALT 28 0 - 44 IU/L  Lipid Panel w/o Chol/HDL Ratio  Result Value Ref Range   Cholesterol, Total 371 (H) 100 - 199 mg/dL   Triglycerides 4,403 (HH) 0 - 149 mg/dL   HDL 26 (L) >47 mg/dL   VLDL Cholesterol Cal Comment (A) 5 - 40 mg/dL   LDL Chol Calc (NIH) Comment (A) 0 - 99 mg/dL   LDL CALC COMMENT: Comment       Assessment & Plan:   Problem List Items Addressed This Visit       Cardiovascular and Mediastinum   Essential hypertension, benign - Primary (Chronic)    Chronic, ongoing.  Continues to have BP above goal.  Will increase Losartan to 100 MG, educated him on this and that he can take two of his 50 MG tablets until gone and then start 100 MG.  Discussed with patient today.  Recommend he monitor BP at least a few mornings a week at home and document.  DASH diet at home.  Labs today: CMP.  Return in 4 weeks.       Relevant Medications   losartan (COZAAR) 100 MG tablet     Other   Hypertriglyceridemia (Chronic)    Chronic, ongoing.  Continue Vascepa and Fenofibrate.  Recheck labs today and add additional medication as needed, was on others in past.  Focus on healthy diet at home.      Relevant Medications   losartan (COZAAR) 100 MG tablet   Other Relevant Orders   Comprehensive metabolic panel   Lipid Panel w/o Chol/HDL Ratio     Follow up plan: Return in about 4 weeks (around  10/25/2023) for  HTN/HLD.

## 2023-09-27 NOTE — Assessment & Plan Note (Signed)
Chronic, ongoing.  Continues to have BP above goal.  Will increase Losartan to 100 MG, educated him on this and that he can take two of his 50 MG tablets until gone and then start 100 MG.  Discussed with patient today.  Recommend he monitor BP at least a few mornings a week at home and document.  DASH diet at home.  Labs today: CMP.  Return in 4 weeks.

## 2023-09-27 NOTE — Assessment & Plan Note (Signed)
Chronic, ongoing.  Continue Vascepa and Fenofibrate.  Recheck labs today and add additional medication as needed, was on others in past.  Focus on healthy diet at home.

## 2023-09-28 ENCOUNTER — Other Ambulatory Visit: Payer: Self-pay | Admitting: Nurse Practitioner

## 2023-09-28 LAB — COMPREHENSIVE METABOLIC PANEL
ALT: 26 [IU]/L (ref 0–44)
AST: 24 [IU]/L (ref 0–40)
Albumin: 4.8 g/dL (ref 3.8–4.9)
Alkaline Phosphatase: 69 [IU]/L (ref 44–121)
BUN/Creatinine Ratio: 15 (ref 9–20)
BUN: 16 mg/dL (ref 6–24)
Bilirubin Total: 0.7 mg/dL (ref 0.0–1.2)
CO2: 19 mmol/L — ABNORMAL LOW (ref 20–29)
Calcium: 9.4 mg/dL (ref 8.7–10.2)
Chloride: 103 mmol/L (ref 96–106)
Creatinine, Ser: 1.1 mg/dL (ref 0.76–1.27)
Globulin, Total: 2.1 g/dL (ref 1.5–4.5)
Glucose: 96 mg/dL (ref 70–99)
Potassium: 3.6 mmol/L (ref 3.5–5.2)
Sodium: 141 mmol/L (ref 134–144)
Total Protein: 6.9 g/dL (ref 6.0–8.5)
eGFR: 79 mL/min/{1.73_m2} (ref >59)

## 2023-09-28 LAB — LIPID PANEL W/O CHOL/HDL RATIO
Cholesterol, Total: 222 mg/dL — ABNORMAL HIGH (ref 100–199)
HDL: 35 mg/dL — ABNORMAL LOW (ref >39)
LDL Chol Calc (NIH): 90 mg/dL (ref 0–99)
Triglycerides: 589 mg/dL (ref 0–149)
VLDL Cholesterol Cal: 97 mg/dL — ABNORMAL HIGH (ref 5–40)

## 2023-09-28 NOTE — Progress Notes (Signed)
Contacted via MyChart   Good evening Scott, your labs are slowly getting better.  Lipid panel continues to show elevation in triglycerides but they are coming down.  We may need to consider starting something like Praluent or Repatha since you are allergic to statins.  We will discussed next visit.  Any questions? Keep being excellent!!  Thank you for allowing me to participate in your care.  I appreciate you. Kindest regards, Haille Pardi

## 2023-10-26 NOTE — Patient Instructions (Signed)

## 2023-10-29 ENCOUNTER — Ambulatory Visit: Payer: Medicaid Other | Admitting: Nurse Practitioner

## 2023-10-29 ENCOUNTER — Encounter: Payer: Self-pay | Admitting: Nurse Practitioner

## 2023-10-29 VITALS — BP 121/77 | HR 88 | Temp 98.4°F | Ht 72.0 in | Wt 216.2 lb

## 2023-10-29 DIAGNOSIS — R7989 Other specified abnormal findings of blood chemistry: Secondary | ICD-10-CM

## 2023-10-29 DIAGNOSIS — R7303 Prediabetes: Secondary | ICD-10-CM

## 2023-10-29 DIAGNOSIS — I10 Essential (primary) hypertension: Secondary | ICD-10-CM

## 2023-10-29 DIAGNOSIS — F411 Generalized anxiety disorder: Secondary | ICD-10-CM | POA: Diagnosis not present

## 2023-10-29 DIAGNOSIS — E785 Hyperlipidemia, unspecified: Secondary | ICD-10-CM

## 2023-10-29 DIAGNOSIS — E781 Pure hyperglyceridemia: Secondary | ICD-10-CM | POA: Diagnosis not present

## 2023-10-29 MED ORDER — ESCITALOPRAM OXALATE 20 MG PO TABS: 20.0000 mg | ORAL_TABLET | Freq: Every day | ORAL | 4 refills | Status: DC

## 2023-10-29 NOTE — Assessment & Plan Note (Signed)
Chronic, stable with BP much improved today.  Will continue Losartan 100 MG, educated him on this. Discussed with patient today.  Recommend he monitor BP at least a few mornings a week at home and document.  DASH diet at home.  Labs today: CMP.  Praised for success with lowering BP level.

## 2023-10-29 NOTE — Assessment & Plan Note (Signed)
Last A1c in September was 5.7%, has been off Metformin.  Will recheck today and continue focus on diet at home.  Restart medication as needed.

## 2023-10-29 NOTE — Assessment & Plan Note (Signed)
Chronic, ongoing.  Recent LDL 90 and Trigs >500.  Recheck labs today and add additional medication as needed.  Did not tolerate any statins in past.  Discussed possibility of starting Repatha or Praluent if ongoing elevations and educated on this.  He is agreeable to this if needed.  Also discussed rechecking testosterone as history of low documented on problem list, if low treating this could also help overall health.

## 2023-10-29 NOTE — Assessment & Plan Note (Signed)
Chronic, stable.  Denies SI/HI.  Will continue Lexapro at 20 MG, which works better for him.  Could consider reduction in future.  Overall tolerates 20 MG best for mood, less irritability.

## 2023-10-29 NOTE — Assessment & Plan Note (Addendum)
History of documented on problem list, but do not see labs for this in Epic. Recheck testosterone, if low treating this could also help overall health.  Educated on this.

## 2023-10-29 NOTE — Progress Notes (Signed)
BP 121/77   Pulse 88   Temp 98.4 F (36.9 C) (Oral)   Ht 6' (1.829 m)   Wt 216 lb 3.2 oz (98.1 kg)   SpO2 95%   BMI 29.32 kg/m    Subjective:    Patient ID: Jesus Barber, male    DOB: 1965/12/24, 57 y.o.   MRN: [id number]  HPI: Jesus Barber is a 57 y.o. male  Chief Complaint  Patient presents with   Hyperlipidemia   Hypertension   HYPERTENSION / HYPERLIPIDEMIA Taking Losartan 100 MG daily + Vascepa/Fenofibrate (last trigs was 589). He has tried multiple statins in past with reactions to these.  Saw endo for a period due to elevation in triglycerides, last 04/28/21.    Ongoing elevations in A1c, prediabetic range, last check in September was 5.7%. Continues on Lexapro for mood, is taking 20 MG -- we tried reducing to 10 MG but was more irritable. Satisfied with current treatment? yes Duration of hypertension: chronic BP monitoring frequency: not checking BP range:  BP medication side effects: no Duration of hyperlipidemia: chronic Cholesterol medication side effects: no Cholesterol supplements: fish oil Medication compliance: good compliance Aspirin: no Recent stressors:  Recurrent headaches: no Visual changes: no Palpitations: no Dyspnea: no Chest pain: no Lower extremity edema: no Dizzy/lightheaded: no     10/29/2023    8:28 AM 09/12/2023    9:27 AM 08/14/2023    3:23 PM 02/05/2023    8:56 AM 06/22/2022    8:28 AM  Depression screen PHQ 2/9  Decreased Interest 0 0 0 0 0  Down, Depressed, Hopeless 0 0 0 0 0  PHQ - 2 Score 0 0 0 0 0  Altered sleeping 0 0 1 1 0  Tired, decreased energy 0 0 1 0 0  Change in appetite 0 0 0 0 0  Feeling bad or failure about yourself  0 0 0 0 0  Trouble concentrating 0 0 0 0 0  Moving slowly or fidgety/restless 0 0 0 0 0  Suicidal thoughts 0 0 0 0 0  PHQ-9 Score 0 0 2 1 0  Difficult doing work/chores Not difficult at all Not difficult at all Not difficult at all Not difficult at all Not difficult at all        10/29/2023    8:28 AM 09/12/2023    9:28 AM 08/14/2023    3:22 PM 02/05/2023    8:58 AM  GAD 7 : Generalized Anxiety Score  Nervous, Anxious, on Edge 0 0 0 0  Control/stop worrying 0 0 0 0  Worry too much - different things 0 0 0 0  Trouble relaxing 0 0 0 0  Restless 0 0 0 0  Easily annoyed or irritable 0 0 1 1  Afraid - awful might happen 0 0 0 0  Total GAD 7 Score 0 0 1 1  Anxiety Difficulty Not difficult at all Not difficult at all Not difficult at all Not difficult at all      Relevant past medical, surgical, family and social history reviewed and updated as indicated. Interim medical history since our last visit reviewed. Allergies and medications reviewed and updated.  Review of Systems  Constitutional:  Negative for activity change, diaphoresis, fatigue and fever.  Respiratory:  Negative for cough, chest tightness, shortness of breath and wheezing.   Cardiovascular:  Negative for chest pain, palpitations and leg swelling.  Gastrointestinal: Negative.   Neurological: Negative.   Psychiatric/Behavioral: Negative.  Per HPI unless specifically indicated above     Objective:    BP 121/77   Pulse 88   Temp 98.4 F (36.9 C) (Oral)   Ht 6' (1.829 m)   Wt 216 lb 3.2 oz (98.1 kg)   SpO2 95%   BMI 29.32 kg/m   Wt Readings from Last 3 Encounters:  10/29/23 216 lb 3.2 oz (98.1 kg)  09/27/23 211 lb 12.8 oz (96.1 kg)  09/12/23 211 lb 3.2 oz (95.8 kg)    Physical Exam Vitals and nursing note reviewed.  Constitutional:      General: He is awake. He is not in acute distress.    Appearance: He is well-developed and well-groomed. He is not ill-appearing or toxic-appearing.  HENT:     Head: Normocephalic.     Right Ear: Hearing and external ear normal.     Left Ear: Hearing and external ear normal.  Eyes:     General: Lids are normal.     Extraocular Movements: Extraocular movements intact.     Conjunctiva/sclera: Conjunctivae normal.  Neck:     Thyroid: No  thyromegaly.     Vascular: No carotid bruit.  Cardiovascular:     Rate and Rhythm: Normal rate and regular rhythm.     Heart sounds: Normal heart sounds. No murmur heard.    No gallop.  Pulmonary:     Effort: No accessory muscle usage or respiratory distress.     Breath sounds: Normal breath sounds.  Abdominal:     General: Bowel sounds are normal. There is no distension.     Palpations: Abdomen is soft.     Tenderness: There is no abdominal tenderness.  Musculoskeletal:     Cervical back: Full passive range of motion without pain.     Right lower leg: No edema.     Left lower leg: No edema.  Lymphadenopathy:     Cervical: No cervical adenopathy.  Skin:    General: Skin is warm.     Capillary Refill: Capillary refill takes less than 2 seconds.  Neurological:     Mental Status: He is alert and oriented to person, place, and time.     Deep Tendon Reflexes: Reflexes are normal and symmetric.     Reflex Scores:      Brachioradialis reflexes are 2+ on the right side and 2+ on the left side.      Patellar reflexes are 2+ on the right side and 2+ on the left side. Psychiatric:        Attention and Perception: Attention normal.        Mood and Affect: Mood normal.        Speech: Speech normal.        Behavior: Behavior normal. Behavior is cooperative.        Thought Content: Thought content normal.     Results for orders placed or performed in visit on 09/27/23  Comprehensive metabolic panel  Result Value Ref Range   Glucose 96 70 - 99 mg/dL   BUN 16 6 - 24 mg/dL   Creatinine, Ser 0.86 0.76 - 1.27 mg/dL   eGFR 79 >57 QI/ONG/2.95   BUN/Creatinine Ratio 15 9 - 20   Sodium 141 134 - 144 mmol/L   Potassium 3.6 3.5 - 5.2 mmol/L   Chloride 103 96 - 106 mmol/L   CO2 19 (L) 20 - 29 mmol/L   Calcium 9.4 8.7 - 10.2 mg/dL   Total Protein 6.9 6.0 - 8.5 g/dL  Albumin 4.8 3.8 - 4.9 g/dL   Globulin, Total 2.1 1.5 - 4.5 g/dL   Bilirubin Total 0.7 0.0 - 1.2 mg/dL   Alkaline Phosphatase  69 44 - 121 IU/L   AST 24 0 - 40 IU/L   ALT 26 0 - 44 IU/L  Lipid Panel w/o Chol/HDL Ratio  Result Value Ref Range   Cholesterol, Total 222 (H) 100 - 199 mg/dL   Triglycerides 952 (HH) 0 - 149 mg/dL   HDL 35 (L) >84 mg/dL   VLDL Cholesterol Cal 97 (H) 5 - 40 mg/dL   LDL Chol Calc (NIH) 90 0 - 99 mg/dL      Assessment & Plan:   Problem List Items Addressed This Visit       Cardiovascular and Mediastinum   Essential hypertension, benign - Primary (Chronic)    Chronic, stable with BP much improved today.  Will continue Losartan 100 MG, educated him on this. Discussed with patient today.  Recommend he monitor BP at least a few mornings a week at home and document.  DASH diet at home.  Labs today: CMP.  Praised for success with lowering BP level.         Other   Dyslipidemia (Chronic)    Chronic, ongoing.  Recent LDL 90 and Trigs >500.  Recheck labs today and add additional medication as needed.  Did not tolerate any statins in past.  Discussed possibility of starting Repatha or Praluent if ongoing elevations and educated on this.  He is agreeable to this if needed.  Also discussed rechecking testosterone as history of low documented on problem list, if low treating this could also help overall health.      Relevant Orders   Comprehensive metabolic panel   Lipid Panel w/o Chol/HDL Ratio   Hypertriglyceridemia (Chronic)    Chronic, ongoing.  Recent LDL 90 and Trigs >500.  Recheck labs today and add additional medication as needed.  Did not tolerate any statins in past.  Discussed possibility of starting Repatha or Praluent if ongoing elevations and educated on this.  He is agreeable to this if needed.  Also discussed rechecking testosterone as history of low documented on problem list, if low treating this could also help overall health.      Relevant Orders   Comprehensive metabolic panel   Lipid Panel w/o Chol/HDL Ratio   GAD (generalized anxiety disorder)    Chronic, stable.   Denies SI/HI.  Will continue Lexapro at 20 MG, which works better for him.  Could consider reduction in future.  Overall tolerates 20 MG best for mood, less irritability.      Relevant Medications   escitalopram (LEXAPRO) 20 MG tablet   Low serum testosterone level    History of documented on problem list, but do not see labs for this in Epic. Recheck testosterone, if low treating this could also help overall health.  Educated on this.      Relevant Orders   Testosterone, free, total([facility])   Prediabetes    Last A1c in September was 5.7%, has been off Metformin.  Will recheck today and continue focus on diet at home.  Restart medication as needed.      Relevant Orders   HgB A1c     Follow up plan: Return in about 6 months (around 04/28/2024) for HTN/HLD, ANXIETY.

## 2023-10-30 ENCOUNTER — Other Ambulatory Visit: Payer: Self-pay | Admitting: Nurse Practitioner

## 2023-10-30 DIAGNOSIS — R7989 Other specified abnormal findings of blood chemistry: Secondary | ICD-10-CM

## 2023-10-30 LAB — COMPREHENSIVE METABOLIC PANEL
ALT: 32 [IU]/L (ref 0–44)
AST: 26 [IU]/L (ref 0–40)
Albumin: 4.9 g/dL (ref 3.8–4.9)
Alkaline Phosphatase: 70 [IU]/L (ref 44–121)
BUN/Creatinine Ratio: 19 (ref 9–20)
BUN: 21 mg/dL (ref 6–24)
Bilirubin Total: 0.5 mg/dL (ref 0.0–1.2)
CO2: 22 mmol/L (ref 20–29)
Calcium: 9.5 mg/dL (ref 8.7–10.2)
Chloride: 100 mmol/L (ref 96–106)
Creatinine, Ser: 1.11 mg/dL (ref 0.76–1.27)
Globulin, Total: 2.4 g/dL (ref 1.5–4.5)
Glucose: 102 mg/dL — ABNORMAL HIGH (ref 70–99)
Potassium: 3.6 mmol/L (ref 3.5–5.2)
Sodium: 139 mmol/L (ref 134–144)
Total Protein: 7.3 g/dL (ref 6.0–8.5)
eGFR: 78 mL/min/{1.73_m2} (ref >59)

## 2023-10-30 LAB — LIPID PANEL W/O CHOL/HDL RATIO
Cholesterol, Total: 286 mg/dL — ABNORMAL HIGH (ref 100–199)
HDL: 34 mg/dL — ABNORMAL LOW (ref >39)
Triglycerides: 879 mg/dL (ref 0–149)

## 2023-10-30 LAB — TESTOSTERONE, FREE, TOTAL, SHBG
Sex Hormone Binding: 9.9 nmol/L — ABNORMAL LOW (ref 19.3–76.4)
Testosterone, Free: 6.9 pg/mL — ABNORMAL LOW (ref 7.2–24.0)
Testosterone: 133 ng/dL — ABNORMAL LOW (ref 264–916)

## 2023-10-30 LAB — HEMOGLOBIN A1C
Est. average glucose Bld gHb Est-mCnc: 128 mg/dL
Hgb A1c MFr Bld: 6.1 % — ABNORMAL HIGH (ref 4.8–5.6)

## 2023-10-30 NOTE — Progress Notes (Signed)
Contacted via MyChart -- needs lab visit in 4 weeks please, also please call and ensure he received message as not always checking MyChart on review:  Good afternoon Scott, your labs have returned: - Triglycerides are creeping up again.  I would recommend we try to get Repatha or Praluent covered, the injectables every 2 weeks I talked about yesterday.  This may offer better control to level.  Continue current medications.  Do you want me to try sending on of these in? - Kidney and liver function are normal. - A1c, diabetes testing, is creeping up a bit more to 6.1%.  Anything 6.5% or greater is considered diabetes.  Watch diet closely. - Testosterone is very low.  Remember I said <300 is low, your level is 133.  I would like to recheck in 4 weeks and if remains low get you into urology to discuss treatment options.  This may also help overall health if treated.  Staff will call to schedule a lab only visit.  Any questions? Keep being stellar!!  Thank you for allowing me to participate in your care.  I appreciate you. Kindest regards, Isha Seefeld

## 2023-11-29 ENCOUNTER — Other Ambulatory Visit: Payer: Medicaid Other

## 2023-11-29 DIAGNOSIS — R7989 Other specified abnormal findings of blood chemistry: Secondary | ICD-10-CM

## 2023-11-30 NOTE — Progress Notes (Signed)
 Good morning, please let Glendia know labs have returned and levels remain low.  I do recommend a visit with urology to discuss treatment.  Would he like to pursue this?  Let me know and I can place referral.  Low testosterone  can cause lots of symptoms, including fatigue being increase and mood changes.  Any questions? Keep being stellar!!  Thank you for allowing me to participate in your care.  I appreciate you. Kindest regards, Kerrington Sova

## 2023-12-02 ENCOUNTER — Other Ambulatory Visit: Payer: Self-pay | Admitting: Nurse Practitioner

## 2023-12-02 DIAGNOSIS — R7989 Other specified abnormal findings of blood chemistry: Secondary | ICD-10-CM

## 2023-12-02 LAB — TESTOSTERONE, FREE, TOTAL, SHBG
Sex Hormone Binding: 10.9 nmol/L — ABNORMAL LOW (ref 19.3–76.4)
Testosterone, Free: 8.2 pg/mL (ref 7.2–24.0)
Testosterone: 240 ng/dL — ABNORMAL LOW (ref 264–916)

## 2024-04-28 ENCOUNTER — Ambulatory Visit: Payer: Self-pay | Admitting: Nurse Practitioner

## 2024-04-28 ENCOUNTER — Encounter: Payer: Self-pay | Admitting: Nurse Practitioner

## 2024-04-28 VITALS — BP 136/84 | HR 108 | Temp 98.3°F | Ht 72.0 in | Wt 227.8 lb

## 2024-04-28 DIAGNOSIS — I10 Essential (primary) hypertension: Secondary | ICD-10-CM | POA: Diagnosis not present

## 2024-04-28 DIAGNOSIS — R7989 Other specified abnormal findings of blood chemistry: Secondary | ICD-10-CM

## 2024-04-28 DIAGNOSIS — E785 Hyperlipidemia, unspecified: Secondary | ICD-10-CM | POA: Diagnosis not present

## 2024-04-28 DIAGNOSIS — R7303 Prediabetes: Secondary | ICD-10-CM | POA: Diagnosis not present

## 2024-04-28 DIAGNOSIS — E781 Pure hyperglyceridemia: Secondary | ICD-10-CM | POA: Diagnosis not present

## 2024-04-28 DIAGNOSIS — F411 Generalized anxiety disorder: Secondary | ICD-10-CM

## 2024-04-28 LAB — MICROALBUMIN, URINE WAIVED
Creatinine, Urine Waived: 200 mg/dL (ref 10–300)
Microalb, Ur Waived: 30 mg/L — ABNORMAL HIGH (ref 0–19)
Microalb/Creat Ratio: <30 mg/g (ref <30)

## 2024-04-28 LAB — BAYER DCA HB A1C WAIVED: HB A1C (BAYER DCA - WAIVED): 6.1 % — ABNORMAL HIGH (ref 4.8–5.6)

## 2024-04-28 MED ORDER — METFORMIN HCL ER 500 MG PO TB24: 500.0000 mg | ORAL_TABLET | Freq: Every day | ORAL | 4 refills | Status: DC

## 2024-04-28 MED ORDER — LOSARTAN POTASSIUM-HCTZ 100-12.5 MG PO TABS: 1.0000 | ORAL_TABLET | Freq: Every day | ORAL | 1 refills | Status: DC

## 2024-04-28 NOTE — Patient Instructions (Addendum)
 Be Involved in Caring For Your Health:  Taking Medications When medications are taken as directed, they can greatly improve your health. But if they are not taken as prescribed, they may not work. In some cases, not taking them correctly can be harmful. To help ensure your treatment remains effective and safe, understand your medications and how to take them. Bring your medications to each visit for review by your provider.  Your lab results, notes, and after visit summary will be available on My Chart. We strongly encourage you to use this feature. If lab results are abnormal the clinic will contact you with the appropriate steps. If the clinic does not contact you assume the results are satisfactory. You can always view your results on My Chart. If you have questions regarding your health or results, please contact the clinic during office hours. You can also ask questions on My Chart.  We at [facility] are grateful that you chose [place] to provide your care. We strive to provide evidence-based and compassionate care and are always looking for feedback. If you get a survey from the clinic please complete this so we can hear your opinions.  DASH Eating Plan DASH stands for Dietary Approaches to Stop Hypertension. The DASH eating plan is a healthy eating plan that has been shown to: Lower high blood pressure (hypertension). Reduce your risk for type 2 diabetes, heart disease, and stroke. Help with weight loss. What are tips for following this plan? Reading food labels Check food labels for the amount of salt (sodium) per serving. Choose foods with less than 5 percent of the Daily Value (DV) of sodium. In general, foods with less than 300 milligrams (mg) of sodium per serving fit into this eating plan. To find whole grains, look for the word "whole" as the first word in the ingredient list. Shopping Buy products labeled as "low-sodium" or "no salt added." Buy fresh foods. Avoid canned  foods and pre-made or frozen meals. Cooking Try not to add salt when you cook. Use salt-free seasonings or herbs instead of table salt or sea salt. Check with your health care provider or pharmacist before using salt substitutes. Do not fry foods. Cook foods in healthy ways, such as baking, boiling, grilling, roasting, or broiling. Cook using oils that are good for your heart. These include olive, canola, avocado, soybean, and sunflower oil. Meal planning  Eat a balanced diet. This should include: 4 or more servings of fruits and 4 or more servings of vegetables each day. Try to fill half of your plate with fruits and vegetables. 6-8 servings of whole grains each day. 6 or less servings of lean meat, poultry, or fish each day. 1 oz is 1 serving. A 3 oz (85 g) serving of meat is about the same size as the palm of your hand. One egg is 1 oz (28 g). 2-3 servings of low-fat dairy each day. One serving is 1 cup (237 mL). 1 serving of nuts, seeds, or beans 5 times each week. 2-3 servings of heart-healthy fats. Healthy fats called omega-3 fatty acids are found in foods such as walnuts, flaxseeds, fortified milks, and eggs. These fats are also found in cold-water fish, such as sardines, salmon, and mackerel. Limit how much you eat of: Canned or prepackaged foods. Food that is high in trans fat, such as fried foods. Food that is high in saturated fat, such as fatty meat. Desserts and other sweets, sugary drinks, and other foods with added sugar. Full-fat  dairy products. Do not salt foods before eating. Do not eat more than 4 egg yolks a week. Try to eat at least 2 vegetarian meals a week. Eat more home-cooked food and less restaurant, buffet, and fast food. Lifestyle When eating at a restaurant, ask if your food can be made with less salt or no salt. If you drink alcohol: Limit how much you have to: 0-1 drink a day if you are male. 0-2 drinks a day if you are male. Know how much alcohol is in  your drink. In the [place] one drink is one 12 oz bottle of beer (355 mL), one 5 oz glass of wine (148 mL), or one 1 oz glass of hard liquor (44 mL). General information Avoid eating more than 2,300 mg of salt a day. If you have hypertension, you may need to reduce your sodium intake to 1,500 mg a day. Work with your provider to stay at a healthy body weight or lose weight. Ask what the best weight range is for you. On most days of the week, get at least 30 minutes of exercise that causes your heart to beat faster. This may include walking, swimming, or biking. Work with your provider or dietitian to adjust your eating plan to meet your specific calorie needs. What foods should I eat? Fruits All fresh, dried, or frozen fruit. Canned fruits that are in their natural juice and do not have sugar added to them. Vegetables Fresh or frozen vegetables that are raw, steamed, roasted, or grilled. Low-sodium or reduced-sodium tomato and vegetable juice. Low-sodium or reduced-sodium tomato sauce and tomato paste. Low-sodium or reduced-sodium canned vegetables. Grains Whole-grain or whole-wheat bread. Whole-grain or whole-wheat pasta. Brown rice. Orpah Cobb. Bulgur. Whole-grain and low-sodium cereals. Pita bread. Low-fat, low-sodium crackers. Whole-wheat flour tortillas. Meats and other proteins Skinless chicken or [place]. Ground chicken or [place]. Pork with fat trimmed off. Fish and seafood. Egg whites. Dried beans, peas, or lentils. Unsalted nuts, nut butters, and seeds. Unsalted canned beans. Lean cuts of beef with fat trimmed off. Low-sodium, lean precooked or cured meat, such as sausages or meat loaves. Dairy Low-fat (1%) or fat-free (skim) milk. Reduced-fat, low-fat, or fat-free cheeses. Nonfat, low-sodium ricotta or cottage cheese. Low-fat or nonfat yogurt. Low-fat, low-sodium cheese. Fats and oils Soft margarine without trans fats. Vegetable oil. Reduced-fat, low-fat, or light mayonnaise and salad  dressings (reduced-sodium). Canola, safflower, olive, avocado, soybean, and sunflower oils. Avocado. Seasonings and condiments Herbs. Spices. Seasoning mixes without salt. Other foods Unsalted popcorn and pretzels. Fat-free sweets. The items listed above may not be all the foods and drinks you can have. Talk to a dietitian to learn more. What foods should I avoid? Fruits Canned fruit in a light or heavy syrup. Fried fruit. Fruit in cream or butter sauce. Vegetables Creamed or fried vegetables. Vegetables in a cheese sauce. Regular canned vegetables that are not marked as low-sodium or reduced-sodium. Regular canned tomato sauce and paste that are not marked as low-sodium or reduced-sodium. Regular tomato and vegetable juices that are not marked as low-sodium or reduced-sodium. Rosita Fire. Olives. Grains Baked goods made with fat, such as croissants, muffins, or some breads. Dry pasta or rice meal packs. Meats and other proteins Fatty cuts of meat. Ribs. Fried meat. Tomasa Blase. Bologna, salami, and other precooked or cured meats, such as sausages or meat loaves, that are not lean and low in sodium. Fat from the back of a pig (fatback). Bratwurst. Salted nuts and seeds. Canned beans with added salt. Canned  or smoked fish. Whole eggs or egg yolks. Chicken or [place] with skin. Dairy Whole or 2% milk, cream, and half-and-half. Whole or full-fat cream cheese. Whole-fat or sweetened yogurt. Full-fat cheese. Nondairy creamers. Whipped toppings. Processed cheese and cheese spreads. Fats and oils Butter. Stick margarine. Lard. Shortening. Ghee. Bacon fat. Tropical oils, such as coconut, palm kernel, or palm oil. Seasonings and condiments Onion salt, garlic salt, seasoned salt, table salt, and sea salt. Worcestershire sauce. Tartar sauce. Barbecue sauce. Teriyaki sauce. Soy sauce, including reduced-sodium soy sauce. Steak sauce. Canned and packaged gravies. Fish sauce. Oyster sauce. Cocktail sauce. Store-bought  horseradish. Ketchup. Mustard. Meat flavorings and tenderizers. Bouillon cubes. Hot sauces. Pre-made or packaged marinades. Pre-made or packaged taco seasonings. Relishes. Regular salad dressings. Other foods Salted popcorn and pretzels. The items listed above may not be all the foods and drinks you should avoid. Talk to a dietitian to learn more. Where to find more information National Heart, Lung, and Blood Institute (NHLBI): BuffaloDryCleaner.gl American Heart Association (AHA): heart.org Academy of Nutrition and Dietetics: eatright.org National Kidney Foundation (NKF): kidney.org This information is not intended to replace advice given to you by your health care provider. Make sure you discuss any questions you have with your health care provider. Document Revised: 11/22/2022 Document Reviewed: 11/22/2022 Elsevier Patient Education  2024 [employer].

## 2024-04-28 NOTE — Assessment & Plan Note (Signed)
 Chronic, ongoing.  Recheck labs today and add additional medication as needed.  Did not tolerate any statins in past.  Discussed possibility of starting Repatha or Praluent if ongoing elevations and educated on this.  He is agreeable to this if needed.  Also discussed rechecking testosterone  as history of low documented on problem list, if low treating this could also help overall health.

## 2024-04-28 NOTE — Progress Notes (Signed)
 BP 136/84 (BP Location: Left Arm, Patient Position: Sitting)   Pulse (!) 108   Temp 98.3 F (36.8 C) (Oral)   Ht 6' (1.829 m)   Wt 227 lb 12.8 oz (103.3 kg)   SpO2 94%   BMI 30.90 kg/m    Subjective:    Patient ID: Jesus Barber, male    DOB: 1966/03/21, 58 y.o.   MRN: [id number]  HPI: Jesus Barber is a 58 y.o. male  Chief Complaint  Patient presents with   Anxiety   Hyperlipidemia   Hypertension   Impaired Fasting Glucose Took Metformin  in the past. HbA1C:  Lab Results  Component Value Date   HGBA1C 6.1 (H) 10/29/2023  Duration of elevated blood sugar:  Polydipsia: no Polyuria: no Weight change: no Visual disturbance: no Glucose Monitoring: no    Accucheck frequency: Not Checking    Fasting glucose:     Post prandial:  Diabetic Education: Not Completed Family history of diabetes: no   HYPERTENSION / HYPERLIPIDEMIA Continues Losartan  100 MG daily + Vascepa /Fenofibrate . He has tried multiple statins in past with reactions to these. Saw endo for a period due to elevation in triglycerides, last 04/28/21.   Satisfied with current treatment? yes Duration of hypertension: chronic BP monitoring frequency: not checking BP range:  BP medication side effects: no Duration of hyperlipidemia: chronic Cholesterol medication side effects: no Cholesterol supplements: fish oil  Medication compliance: good compliance Aspirin : no Recent stressors:  Recurrent headaches: no Visual changes: no Palpitations: no Dyspnea: no Chest pain: no Lower extremity edema: no Dizzy/lightheaded: no   DEPRESSION/ANXIETY Continues on Lexapro .  History of low testosterone  on last labs, no current treatment. Mood status: stable Satisfied with current treatment?: yes Symptom severity: mild  Duration of current treatment : chronic Side effects: no Medication compliance: good compliance Psychotherapy/counseling: none Depressed mood: no Anxious mood: no Anhedonia:  no Significant weight loss or gain: no Insomnia: none Fatigue: yes Feelings of worthlessness or guilt: no Impaired concentration/indecisiveness: no Suicidal ideations: no Hopelessness: no Crying spells: no    04/28/2024    8:35 AM 10/29/2023    8:28 AM 09/12/2023    9:27 AM 08/14/2023    3:23 PM 02/05/2023    8:56 AM  Depression screen PHQ 2/9  Decreased Interest 0 0 0 0 0  Down, Depressed, Hopeless 0 0 0 0 0  PHQ - 2 Score 0 0 0 0 0  Altered sleeping 0 0 0 1 1  Tired, decreased energy 0 0 0 1 0  Change in appetite 0 0 0 0 0  Feeling bad or failure about yourself  0 0 0 0 0  Trouble concentrating 0 0 0 0 0  Moving slowly or fidgety/restless 0 0 0 0 0  Suicidal thoughts 0 0 0 0 0  PHQ-9 Score 0 0 0 2 1  Difficult doing work/chores Not difficult at all Not difficult at all Not difficult at all Not difficult at all Not difficult at all       04/28/2024    8:35 AM 10/29/2023    8:28 AM 09/12/2023    9:28 AM 08/14/2023    3:22 PM  GAD 7 : Generalized Anxiety Score  Nervous, Anxious, on Edge 0 0 0 0  Control/stop worrying 0 0 0 0  Worry too much - different things 0 0 0 0  Trouble relaxing 0 0 0 0  Restless 0 0 0 0  Easily annoyed or irritable 0 0 0 1  Afraid -  awful might happen 0 0 0 0  Total GAD 7 Score 0 0 0 1  Anxiety Difficulty Not difficult at all Not difficult at all Not difficult at all Not difficult at all   Relevant past medical, surgical, family and social history reviewed and updated as indicated. Interim medical history since our last visit reviewed. Allergies and medications reviewed and updated.  Review of Systems  Constitutional:  Positive for fatigue. Negative for activity change, diaphoresis and fever.  Respiratory:  Negative for cough, chest tightness, shortness of breath and wheezing.   Cardiovascular:  Negative for chest pain, palpitations and leg swelling.  Gastrointestinal: Negative.   Neurological: Negative.   Psychiatric/Behavioral: Negative.       Per HPI unless specifically indicated above     Objective:    BP 136/84 (BP Location: Left Arm, Patient Position: Sitting)   Pulse (!) 108   Temp 98.3 F (36.8 C) (Oral)   Ht 6' (1.829 m)   Wt 227 lb 12.8 oz (103.3 kg)   SpO2 94%   BMI 30.90 kg/m   Wt Readings from Last 3 Encounters:  04/28/24 227 lb 12.8 oz (103.3 kg)  10/29/23 216 lb 3.2 oz (98.1 kg)  09/27/23 211 lb 12.8 oz (96.1 kg)    Physical Exam Vitals and nursing note reviewed.  Constitutional:      General: He is awake. He is not in acute distress.    Appearance: He is well-developed and well-groomed. He is not ill-appearing or toxic-appearing.  HENT:     Head: Normocephalic.     Right Ear: Hearing and external ear normal.     Left Ear: Hearing and external ear normal.  Eyes:     General: Lids are normal.     Extraocular Movements: Extraocular movements intact.     Conjunctiva/sclera: Conjunctivae normal.  Neck:     Thyroid: No thyromegaly.     Vascular: No carotid bruit.  Cardiovascular:     Rate and Rhythm: Normal rate and regular rhythm.     Heart sounds: Normal heart sounds. No murmur heard.    No gallop.  Pulmonary:     Effort: No accessory muscle usage or respiratory distress.     Breath sounds: Normal breath sounds.  Abdominal:     General: Bowel sounds are normal. There is no distension.     Palpations: Abdomen is soft.     Tenderness: There is no abdominal tenderness.  Musculoskeletal:     Cervical back: Full passive range of motion without pain.     Right lower leg: No edema.     Left lower leg: No edema.  Lymphadenopathy:     Cervical: No cervical adenopathy.  Skin:    General: Skin is warm.     Capillary Refill: Capillary refill takes less than 2 seconds.  Neurological:     Mental Status: He is alert and oriented to person, place, and time.     Deep Tendon Reflexes: Reflexes are normal and symmetric.     Reflex Scores:      Brachioradialis reflexes are 2+ on the right side and 2+  on the left side.      Patellar reflexes are 2+ on the right side and 2+ on the left side. Psychiatric:        Attention and Perception: Attention normal.        Mood and Affect: Mood normal.        Speech: Speech normal.        Behavior:  Behavior normal. Behavior is cooperative.        Thought Content: Thought content normal.    Results for orders placed or performed in visit on 11/29/23  Testosterone , free, total([facility])   Collection Time: 11/29/23  8:22 AM  Result Value Ref Range   Testosterone  240 (L) 264 - 916 ng/dL   Testosterone , Free 8.2 7.2 - 24.0 pg/mL   Sex Hormone Binding 10.9 (L) 19.3 - 76.4 nmol/L      Assessment & Plan:   Problem List Items Addressed This Visit       Cardiovascular and Mediastinum   Essential hypertension, benign - Primary (Chronic)   Chronic, ongoing.  BP above goal today.  Change to Losartan -HCTZ 100-12.5 MG dosing, educated him on this and side effects to report. Discussed with patient today.  Recommend he monitor BP at least a few mornings a week at home and document.  DASH diet at home.  Labs today: CBC, TSH, CMP, urine ALB.       Relevant Medications   losartan -hydrochlorothiazide (HYZAAR) 100-12.5 MG tablet   Other Relevant Orders   Microalbumin, Urine Waived   CBC with Differential/Platelet   TSH     Other   Hypertriglyceridemia (Chronic)   Chronic, ongoing. Recheck labs today and add additional medication as needed.  Did not tolerate any statins in past.  Discussed possibility of starting Repatha or Praluent if ongoing elevations and educated on this.  He is agreeable to this if needed.  Also discussed rechecking testosterone  as history of low documented on problem list, if low treating this could also help overall health.      Relevant Medications   losartan -hydrochlorothiazide (HYZAAR) 100-12.5 MG tablet   Other Relevant Orders   Comprehensive metabolic panel with GFR   Lipid Panel w/o Chol/HDL Ratio   Dyslipidemia  (Chronic)   Chronic, ongoing.  Recheck labs today and add additional medication as needed.  Did not tolerate any statins in past.  Discussed possibility of starting Repatha or Praluent if ongoing elevations and educated on this.  He is agreeable to this if needed.  Also discussed rechecking testosterone  as history of low documented on problem list, if low treating this could also help overall health.      Relevant Orders   Comprehensive metabolic panel with GFR   Lipid Panel w/o Chol/HDL Ratio   Prediabetes   Ongoing.  A1c remains 6.1% today, recommend restart Metformin  XR 500 MG, but daily dosing only.  Discussed plan with him.  Also recheck testosterone  and send to urology as needed to correct if low.  Focus on diet and regular exercise.      Relevant Orders   Bayer DCA Hb A1c Waived   Microalbumin, Urine Waived   Low serum testosterone  level   Low, <300, on recent labs.  Recheck today and consider referral to urology if ongoing lows, as correcting this may also help overall health.      Relevant Orders   Testosterone , free, total([facility])   GAD (generalized anxiety disorder)   Chronic, stable.  Denies SI/HI.  Will continue Lexapro  at 20 MG, which works better for him.  Could consider reduction in future.  Overall tolerates 20 MG best for mood, less irritability.        Follow up plan: Return in about 4 weeks (around 05/26/2024) for HTN AND PREDIABETES.

## 2024-04-28 NOTE — Assessment & Plan Note (Signed)
 Low, <300, on recent labs.  Recheck today and consider referral to urology if ongoing lows, as correcting this may also help overall health.

## 2024-04-28 NOTE — Assessment & Plan Note (Signed)
 Ongoing.  A1c remains 6.1% today, recommend restart Metformin  XR 500 MG, but daily dosing only.  Discussed plan with him.  Also recheck testosterone  and send to urology as needed to correct if low.  Focus on diet and regular exercise.

## 2024-04-28 NOTE — Assessment & Plan Note (Signed)
 Chronic, stable.  Denies SI/HI.  Will continue Lexapro at 20 MG, which works better for him.  Could consider reduction in future.  Overall tolerates 20 MG best for mood, less irritability.

## 2024-04-28 NOTE — Assessment & Plan Note (Signed)
 Chronic, ongoing.  BP above goal today.  Change to Losartan -HCTZ 100-12.5 MG dosing, educated him on this and side effects to report. Discussed with patient today.  Recommend he monitor BP at least a few mornings a week at home and document.  DASH diet at home.  Labs today: CBC, TSH, CMP, urine ALB.

## 2024-04-29 ENCOUNTER — Ambulatory Visit: Payer: Self-pay | Admitting: Nurse Practitioner

## 2024-04-29 NOTE — Progress Notes (Signed)
 Contacted via MyChart -- but please call to ensure he sees and get answers Good evening Jesus Barber, your labs have returned: - Lipid panel shows ongoing elevation in triglycerides.  I would recommend we look into getting you onto Repatha or Praluent, injections to help with cholesterol labs.  Would you be okay with this? Since you did not tolerate statins. - Kidney function, creatinine and eGFR, remains normal, as is liver function, AST and ALT.  - Testosterone  level remains low, are you okay with the urology referral we discussed? Please let me know.  Any questions? Keep being amazing!!  Thank you for allowing me to participate in your care.  I appreciate you. Kindest regards, Cliffton Spradley

## 2024-04-30 LAB — CBC WITH DIFFERENTIAL/PLATELET
Basophils Absolute: 0 10*3/uL (ref 0.0–0.2)
Basos: 0 %
EOS (ABSOLUTE): 0.1 10*3/uL (ref 0.0–0.4)
Eos: 2 %
Hematocrit: 43.7 % (ref 37.5–51.0)
Hemoglobin: 14.8 g/dL (ref 13.0–17.7)
Immature Grans (Abs): 0 10*3/uL (ref 0.0–0.1)
Immature Granulocytes: 0 %
Lymphocytes Absolute: 1.5 10*3/uL (ref 0.7–3.1)
Lymphs: 37 %
MCH: 31.4 pg (ref 26.6–33.0)
MCHC: 33.9 g/dL (ref 31.5–35.7)
MCV: 93 fL (ref 79–97)
Monocytes Absolute: 0.5 10*3/uL (ref 0.1–0.9)
Monocytes: 12 %
Neutrophils Absolute: 2 10*3/uL (ref 1.4–7.0)
Neutrophils: 49 %
Platelets: 181 10*3/uL (ref 150–450)
RBC: 4.72 x10E6/uL (ref 4.14–5.80)
RDW: 12.2 % (ref 11.6–15.4)
WBC: 4.1 10*3/uL (ref 3.4–10.8)

## 2024-04-30 LAB — COMPREHENSIVE METABOLIC PANEL WITH GFR
ALT: 37 IU/L (ref 0–44)
AST: 29 IU/L (ref 0–40)
Albumin: 4.3 g/dL (ref 3.8–4.9)
Alkaline Phosphatase: 76 IU/L (ref 44–121)
BUN/Creatinine Ratio: 13 (ref 9–20)
BUN: 14 mg/dL (ref 6–24)
Bilirubin Total: 0.5 mg/dL (ref 0.0–1.2)
CO2: 15 mmol/L — ABNORMAL LOW (ref 20–29)
Calcium: 9 mg/dL (ref 8.7–10.2)
Chloride: 101 mmol/L (ref 96–106)
Creatinine, Ser: 1.06 mg/dL (ref 0.76–1.27)
Globulin, Total: 2.4 g/dL (ref 1.5–4.5)
Glucose: 130 mg/dL — ABNORMAL HIGH (ref 70–99)
Potassium: 3.9 mmol/L (ref 3.5–5.2)
Sodium: 139 mmol/L (ref 134–144)
Total Protein: 6.7 g/dL (ref 6.0–8.5)
eGFR: 82 mL/min/{1.73_m2} (ref >59)

## 2024-04-30 LAB — LIPID PANEL W/O CHOL/HDL RATIO
Cholesterol, Total: 357 mg/dL — ABNORMAL HIGH (ref 100–199)
HDL: 23 mg/dL — ABNORMAL LOW (ref >39)
Triglycerides: 1803 mg/dL (ref 0–149)

## 2024-04-30 LAB — TESTOSTERONE, FREE, TOTAL, SHBG
Sex Hormone Binding: 8.3 nmol/L — ABNORMAL LOW (ref 19.3–76.4)
Testosterone, Free: 10.5 pg/mL (ref 7.2–24.0)
Testosterone: 142 ng/dL — ABNORMAL LOW (ref 264–916)

## 2024-04-30 LAB — TSH: TSH: 1.67 u[IU]/mL (ref 0.450–4.500)

## 2024-05-02 ENCOUNTER — Other Ambulatory Visit: Payer: Self-pay | Admitting: Internal Medicine

## 2024-05-02 DIAGNOSIS — E785 Hyperlipidemia, unspecified: Secondary | ICD-10-CM

## 2024-05-05 NOTE — Telephone Encounter (Signed)
 Requested medication (s) are due for refill today: no  Requested medication (s) are on the active medication list: yes   Last refill:  08/14/23 #360 4 refills  Future visit scheduled: yes 05/29/24  Notes to clinic:  medication not assigned to a protocol. Pharmacy requesting 90 day supply.      Requested Prescriptions  Pending Prescriptions Disp Refills   icosapent  Ethyl (VASCEPA ) 1 g capsule [Pharmacy Med Name: ICOSAPENT  ETHYL 1GM CAPSULES] 360 capsule 4    Sig: TAKE 2 CAPSULES(2 GRAMS) BY MOUTH TWICE DAILY     Off-Protocol Failed - 05/05/2024 12:31 PM      Failed - Medication not assigned to a protocol, review manually.      Passed - Valid encounter within last 12 months    Recent Outpatient Visits           1 week ago Essential hypertension, benign   [facility] [place], Lavelle Posey, NP

## 2024-05-23 NOTE — Patient Instructions (Signed)
Wegovy or Zepbound  Focus on DASH diet for high blood pressure or Mediterranean diet  Be Involved in Caring For Your Health:  Taking Medications When medications are taken as directed, they can greatly improve your health. But if they are not taken as prescribed, they may not work. In some cases, not taking them correctly can be harmful. To help ensure your treatment remains effective and safe, understand your medications and how to take them. Bring your medications to each visit for review by your provider.  Your lab results, notes, and after visit summary will be available on My Chart. We strongly encourage you to use this feature. If lab results are abnormal the clinic will contact you with the appropriate steps. If the clinic does not contact you assume the results are satisfactory. You can always view your results on My Chart. If you have questions regarding your health or results, please contact the clinic during office hours. You can also ask questions on My Chart.  We at [facility] are grateful that you chose us to provide your care. We strive to provide evidence-based and compassionate care and are always looking for feedback. If you get a survey from the clinic please complete this so we can hear your opinions.  Preventing High Cholesterol Cholesterol is a white, waxy substance similar to fat that the human body needs to help build cells. The liver makes all the cholesterol that a person's body needs. Having high cholesterol (hypercholesterolemia) increases your risk for heart disease and stroke. Extra or excess cholesterol comes from the food that you eat. High cholesterol can often be prevented with diet and lifestyle changes. If you already have high cholesterol, you can control it with diet, lifestyle changes, and medicines. How can high cholesterol affect me? If you have high cholesterol, fatty deposits (plaques) may build up on the walls of your blood vessels. The blood  vessels that carry blood away from your heart are called arteries. Plaques make the arteries narrower and stiffer. This in turn can: Restrict or block blood flow and cause blood clots to form. Increase your risk for heart attack and stroke. What can increase my risk for high cholesterol? This condition is more likely to develop in people who: Eat foods that are high in saturated fat or cholesterol. Saturated fat is mostly found in foods that come from animal sources. Are overweight. Are not getting enough exercise. Use products that contain nicotine or tobacco, such as cigarettes, e-cigarettes, and chewing tobacco. Have a family history of high cholesterol (familial hypercholesterolemia). What actions can I take to prevent this? Nutrition  Eat less saturated fat. Avoid trans fats (partially hydrogenated oils). These are often found in margarine and in some baked goods, fried foods, and snacks bought in packages. Avoid precooked or cured meat, such as bacon, sausages, or meat loaves. Avoid foods and drinks that have added sugars. Eat more fruits, vegetables, and whole grains. Choose healthy sources of protein, such as fish, poultry, lean cuts of red meat, beans, peas, lentils, and nuts. Choose healthy sources of fat, such as: Nuts. Vegetable oils, especially olive oil. Fish that have healthy fats, such as omega-3 fatty acids. These fish include mackerel or salmon. Lifestyle Lose weight if you are overweight. Maintaining a healthy body mass index (BMI) can help prevent or control high cholesterol. It can also lower your risk for diabetes and high blood pressure. Ask your health care provider to help you with a diet and exercise plan to lose   weight safely. Do not use any products that contain nicotine or tobacco. These products include cigarettes, chewing tobacco, and vaping devices, such as e-cigarettes. If you need help quitting, ask your health care provider. Alcohol use Do not drink  alcohol if: Your health care provider tells you not to drink. You are pregnant, may be pregnant, or are planning to become pregnant. If you drink alcohol: Limit how much you have to: 0-1 drink a day for women. 0-2 drinks a day for men. Know how much alcohol is in your drink. In the [place] one drink equals one 12 oz bottle of beer (355 mL), one 5 oz glass of wine (148 mL), or one 1 oz glass of hard liquor (44 mL). Activity  Get enough exercise. Do exercises as told by your health care provider. Each week, do at least 150 minutes of exercise that takes a medium level of effort (moderate-intensity exercise). This kind of exercise: Makes your heart beat faster while allowing you to still be able to talk. Can be done in short sessions several times a day or longer sessions a few times a week. For example, on 5 days each week, you could walk fast or ride your bike 3 times a day for 10 minutes each time. Medicines Your health care provider may recommend medicines to help lower cholesterol. This may be a medicine to lower the amount of cholesterol that your liver makes. You may need medicine if: Diet and lifestyle changes have not lowered your cholesterol enough. You have high cholesterol and other risk factors for heart disease or stroke. Take over-the-counter and prescription medicines only as told by your health care provider. General information Manage your risk factors for high cholesterol. Talk with your health care provider about all your risk factors and how to lower your risk. Manage other conditions that you have, such as diabetes or high blood pressure (hypertension). Have blood tests to check your cholesterol levels at regular points in time as told by your health care provider. Keep all follow-up visits. This is important. Where to find more information American Heart Association: www.heart.org National Heart, Lung, and Blood Institute: www.nhlbi.nih.gov Summary High cholesterol  increases your risk for heart disease and stroke. By keeping your cholesterol level low, you can reduce your risk for these conditions. High cholesterol can often be prevented with diet and lifestyle changes. Work with your health care provider to manage your risk factors, and have your blood tested regularly. This information is not intended to replace advice given to you by your health care provider. Make sure you discuss any questions you have with your health care provider. Document Revised: 06/08/2022 Document Reviewed: 01/09/2021 Elsevier Patient Education  2024 [employer]  

## 2024-05-29 ENCOUNTER — Encounter: Payer: Self-pay | Admitting: Nurse Practitioner

## 2024-05-29 ENCOUNTER — Ambulatory Visit: Admitting: Nurse Practitioner

## 2024-05-29 VITALS — BP 123/79 | HR 92 | Temp 98.7°F | Ht 72.0 in | Wt 228.6 lb

## 2024-05-29 DIAGNOSIS — R7303 Prediabetes: Secondary | ICD-10-CM

## 2024-05-29 DIAGNOSIS — I10 Essential (primary) hypertension: Secondary | ICD-10-CM | POA: Diagnosis not present

## 2024-05-29 NOTE — Progress Notes (Signed)
 BP 123/79   Pulse 92   Temp 98.7 F (37.1 C) (Oral)   Ht 6' (1.829 m)   Wt 228 lb 9.6 oz (103.7 kg)   SpO2 98%   BMI 31.00 kg/m    Subjective:    Patient ID: Jesus Barber, male    DOB: [birth year], 58 y.o.   MRN: [id number]  HPI: Jesus Barber is a 58 y.o. male  Chief Complaint  Patient presents with   Hypertension   Prediabetes   HYPERTENSION / HYPERLIPIDEMIA Changed to Losartan -HCTZ 100-12.5 MG last visit.  Satisfied with current treatment? yes Duration of hypertension: chronic BP monitoring frequency: not checking BP range:  BP medication side effects: no Duration of hyperlipidemia: chronic Cholesterol medication side effects: no Cholesterol supplements: fish oil  Medication compliance: good compliance Aspirin : no Recent stressors:  Recurrent headaches: no Visual changes: no Palpitations: no Dyspnea: no Chest pain: no Lower extremity edema: no Dizzy/lightheaded: no   Impaired Fasting Glucose Has restarted Metformin , which he took in past. HbA1C:  Lab Results  Component Value Date   HGBA1C 6.1 (H) 04/28/2024  Duration of elevated blood sugar:  Polydipsia: no Polyuria: no Weight change: no Visual disturbance: no Glucose Monitoring: no    Accucheck frequency: Not Checking    Fasting glucose:     Post prandial:  Diabetic Education: Not Completed Family history of diabetes: no  Relevant past medical, surgical, family and social history reviewed and updated as indicated. Interim medical history since our last visit reviewed. Allergies and medications reviewed and updated.  Review of Systems  Constitutional:  Positive for fatigue. Negative for activity change, diaphoresis and fever.  Respiratory:  Negative for cough, chest tightness, shortness of breath and wheezing.   Cardiovascular:  Negative for chest pain, palpitations and leg swelling.  Gastrointestinal: Negative.   Neurological: Negative.   Psychiatric/Behavioral: Negative.       Per HPI unless specifically indicated above     Objective:    BP 123/79   Pulse 92   Temp 98.7 F (37.1 C) (Oral)   Ht 6' (1.829 m)   Wt 228 lb 9.6 oz (103.7 kg)   SpO2 98%   BMI 31.00 kg/m   Wt Readings from Last 3 Encounters:  05/29/24 228 lb 9.6 oz (103.7 kg)  04/28/24 227 lb 12.8 oz (103.3 kg)  10/29/23 216 lb 3.2 oz (98.1 kg)    Physical Exam Vitals and nursing note reviewed.  Constitutional:      General: He is awake. He is not in acute distress.    Appearance: He is well-developed and well-groomed. He is not ill-appearing or toxic-appearing.  HENT:     Head: Normocephalic.     Right Ear: Hearing and external ear normal.     Left Ear: Hearing and external ear normal.  Eyes:     General: Lids are normal.     Extraocular Movements: Extraocular movements intact.     Conjunctiva/sclera: Conjunctivae normal.  Neck:     Thyroid: No thyromegaly.     Vascular: No carotid bruit.  Cardiovascular:     Rate and Rhythm: Normal rate and regular rhythm.     Heart sounds: Normal heart sounds. No murmur heard.    No gallop.  Pulmonary:     Effort: No accessory muscle usage or respiratory distress.     Breath sounds: Normal breath sounds.  Abdominal:     General: Bowel sounds are normal. There is no distension.     Palpations: Abdomen is soft.  Tenderness: There is no abdominal tenderness.  Musculoskeletal:     Cervical back: Full passive range of motion without pain.     Right lower leg: No edema.     Left lower leg: No edema.  Lymphadenopathy:     Cervical: No cervical adenopathy.  Skin:    General: Skin is warm.     Capillary Refill: Capillary refill takes less than 2 seconds.  Neurological:     Mental Status: He is alert and oriented to person, place, and time.     Deep Tendon Reflexes: Reflexes are normal and symmetric.     Reflex Scores:      Brachioradialis reflexes are 2+ on the right side and 2+ on the left side.      Patellar reflexes are 2+ on the  right side and 2+ on the left side. Psychiatric:        Attention and Perception: Attention normal.        Mood and Affect: Mood normal.        Speech: Speech normal.        Behavior: Behavior normal. Behavior is cooperative.        Thought Content: Thought content normal.    Results for orders placed or performed in visit on 04/28/24  Bayer DCA Hb A1c Waived   Collection Time: 04/28/24  8:45 AM  Result Value Ref Range   HB A1C (BAYER DCA - WAIVED) 6.1 (H) 4.8 - 5.6 %  Microalbumin, Urine Waived   Collection Time: 04/28/24  8:45 AM  Result Value Ref Range   Microalb, Ur Waived 30 (H) 0 - 19 mg/L   Creatinine, Urine Waived 200 10 - 300 mg/dL   Microalb/Creat Ratio <30 <30 mg/g  CBC with Differential/Platelet   Collection Time: 04/28/24  8:47 AM  Result Value Ref Range   WBC 4.1 3.4 - 10.8 x10E3/uL   RBC 4.72 4.14 - 5.80 x10E6/uL   Hemoglobin 14.8 13.0 - 17.7 g/dL   Hematocrit 56.2 62.4 - 51.0 %   MCV 93 79 - 97 fL   MCH 31.4 26.6 - 33.0 pg   MCHC 33.9 31.5 - 35.7 g/dL   RDW 87.7 88.3 - 84.5 %   Platelets 181 150 - 450 x10E3/uL   Neutrophils 49 Not Estab. %   Lymphs 37 Not Estab. %   Monocytes 12 Not Estab. %   Eos 2 Not Estab. %   Basos 0 Not Estab. %   Neutrophils Absolute 2.0 1.4 - 7.0 x10E3/uL   Lymphocytes Absolute 1.5 0.7 - 3.1 x10E3/uL   Monocytes Absolute 0.5 0.1 - 0.9 x10E3/uL   EOS (ABSOLUTE) 0.1 0.0 - 0.4 x10E3/uL   Basophils Absolute 0.0 0.0 - 0.2 x10E3/uL   Immature Granulocytes 0 Not Estab. %   Immature Grans (Abs) 0.0 0.0 - 0.1 x10E3/uL  Comprehensive metabolic panel with GFR   Collection Time: 04/28/24  8:47 AM  Result Value Ref Range   Glucose 130 (H) 70 - 99 mg/dL   BUN 14 6 - 24 mg/dL   Creatinine, Ser 8.93 0.76 - 1.27 mg/dL   eGFR 82 >40 fO/fpw/8.26   BUN/Creatinine Ratio 13 9 - 20   Sodium 139 134 - 144 mmol/L   Potassium 3.9 3.5 - 5.2 mmol/L   Chloride 101 96 - 106 mmol/L   CO2 15 (L) 20 - 29 mmol/L   Calcium  9.0 8.7 - 10.2 mg/dL   Total  Protein 6.7 6.0 - 8.5 g/dL   Albumin 4.3 3.8 -  4.9 g/dL   Globulin, Total 2.4 1.5 - 4.5 g/dL   Bilirubin Total 0.5 0.0 - 1.2 mg/dL   Alkaline Phosphatase 76 44 - 121 IU/L   AST 29 0 - 40 IU/L   ALT 37 0 - 44 IU/L  Lipid Panel w/o Chol/HDL Ratio   Collection Time: 04/28/24  8:47 AM  Result Value Ref Range   Cholesterol, Total 357 (H) 100 - 199 mg/dL   Triglycerides 8,196 (HH) 0 - 149 mg/dL   HDL 23 (L) >60 mg/dL   VLDL Cholesterol Cal Comment (A) 5 - 40 mg/dL   LDL Chol Calc (NIH) Comment (A) 0 - 99 mg/dL   LDL CALC COMMENT: Comment   TSH   Collection Time: 04/28/24  8:47 AM  Result Value Ref Range   TSH 1.670 0.450 - 4.500 uIU/mL  Testosterone , free, total([facility])   Collection Time: 04/28/24  8:47 AM  Result Value Ref Range   Testosterone  142 (L) 264 - 916 ng/dL   Testosterone , Free 10.5 7.2 - 24.0 pg/mL   Sex Hormone Binding 8.3 (L) 19.3 - 76.4 nmol/L      Assessment & Plan:   Problem List Items Addressed This Visit       Cardiovascular and Mediastinum   Essential hypertension, benign - Primary (Chronic)   Chronic and much improved with medication changes.  BP at goal today.  Continue Losartan -HCTZ 100-12.5 MG dosing, educated him on this and side effects to report. Discussed with patient today. Recommend he monitor BP at least a few mornings a week at home and document.  DASH diet at home.  Labs today: up to date.         Other   Prediabetes   Ongoing.  A1c 6.1% in June 2025, he has restarted Metformin  and tolerating this. Focus on diet and regular exercise.         Follow up plan: Return in about 6 months (around 11/29/2024) for HTN/HLD, IFG.

## 2024-05-29 NOTE — Assessment & Plan Note (Signed)
 Ongoing.  A1c 6.1% in June 2025, he has restarted Metformin  and tolerating this. Focus on diet and regular exercise.

## 2024-05-29 NOTE — Assessment & Plan Note (Signed)
 Chronic and much improved with medication changes.  BP at goal today.  Continue Losartan -HCTZ 100-12.5 MG dosing, educated him on this and side effects to report. Discussed with patient today. Recommend he monitor BP at least a few mornings a week at home and document.  DASH diet at home.  Labs today: up to date.

## 2024-08-10 ENCOUNTER — Other Ambulatory Visit (HOSPITAL_COMMUNITY): Payer: Self-pay

## 2024-08-10 ENCOUNTER — Telehealth: Payer: Self-pay

## 2024-08-10 NOTE — Telephone Encounter (Signed)
 Pharmacy Patient Advocate Encounter   Received notification from Onbase that prior authorization for Icosapent  Ethyl 1GM capsules  is required/requested.   Insurance verification completed.   The patient is insured through [employer] .   Per test claim: PA required; PA submitted to above mentioned insurance via Latent Key/confirmation #/EOC [id number] Status is pending

## 2024-08-20 ENCOUNTER — Telehealth: Payer: Self-pay | Admitting: Pharmacist

## 2024-08-20 ENCOUNTER — Other Ambulatory Visit (HOSPITAL_COMMUNITY): Payer: Self-pay

## 2024-08-20 NOTE — Telephone Encounter (Signed)
 Pharmacy Patient Advocate Encounter  Received notification from [employer] that Prior Authorization for Icosapent  Ethyl 1GM capsules  has been DENIED.  Full denial letter will be uploaded to the media tab. See denial reason below.   PA #/Case ID/Reference #: [id number]    *routing to appeals team

## 2024-08-20 NOTE — Telephone Encounter (Signed)
 E-Appeal has been submitted. Will advise when response is received, please be advised that most companies may take 30 days to make a decision. Appeal letter and supporting documentation have been uploaded and submitted via CMM website.  Thank you, Dene Fines, PharmD Clinical Pharmacist  [place]  Direct Dial: [phone]

## 2024-08-20 NOTE — Telephone Encounter (Signed)
 Noted

## 2024-08-21 NOTE — Telephone Encounter (Signed)
 Noted

## 2024-08-21 NOTE — Telephone Encounter (Signed)
 Insurance has approved the appeal for Icosapent  Ethyl capsules, full letter can be found under the media tab.

## 2024-09-14 ENCOUNTER — Other Ambulatory Visit: Payer: Self-pay | Admitting: Nurse Practitioner

## 2024-09-16 NOTE — Telephone Encounter (Signed)
 Requested Prescriptions  Pending Prescriptions Disp Refills   fenofibrate  (TRICOR ) 145 MG tablet [Pharmacy Med Name: FENOFIBRATE  145 MG TABLET] 90 tablet 0    Sig: TAKE 1 TABLET BY MOUTH EVERY DAY     Cardiovascular:  Antilipid - Fibric Acid Derivatives Failed - 09/16/2024 11:12 AM      Failed - Lipid Panel in normal range within the last 12 months    Cholesterol, Total  Date Value Ref Range Status  04/28/2024 357 (H) 100 - 199 mg/dL Final   LDL Cholesterol (Calc)  Date Value Ref Range Status  06/22/2022  mg/dL (calc) Final    Comment:    . LDL cholesterol not calculated. Triglyceride levels greater than 400 mg/dL invalidate calculated LDL results. . Reference range: <100 . Desirable range <100 mg/dL for primary prevention;   <70 mg/dL for patients with CHD or diabetic patients  with > or = 2 CHD risk factors. SABRA LDL-C is now calculated using the Martin-Hopkins  calculation, which is a validated novel method providing  better accuracy than the Friedewald equation in the  estimation of LDL-C.  Gladis APPLETHWAITE et al. SANDREA. 7986;689(80): 2061-2068  (http://education.[facility].com/faq/FAQ164)    LDL Chol Calc (NIH)  Date Value Ref Range Status  04/28/2024 Comment (A) 0 - 99 mg/dL Final    Comment:    Triglyceride result indicated is too high for an accurate LDL cholesterol estimation.    HDL  Date Value Ref Range Status  04/28/2024 23 (L) >39 mg/dL Final   Triglycerides  Date Value Ref Range Status  04/28/2024 1,803 (HH) 0 - 149 mg/dL Final    Comment:    Results confirmed on dilution.          Passed - ALT in normal range and within 360 days    ALT  Date Value Ref Range Status  04/28/2024 37 0 - 44 IU/L Final   ALT (SGPT) Piccolo, Waived  Date Value Ref Range Status  01/13/2016 51 (H) 10 - 47 U/L Final         Passed - AST in normal range and within 360 days    AST  Date Value Ref Range Status  04/28/2024 29 0 - 40 IU/L Final   AST (SGOT) Piccolo,  Waived  Date Value Ref Range Status  01/13/2016 46 (H) 11 - 38 U/L Final         Passed - Cr in normal range and within 360 days    Creat  Date Value Ref Range Status  06/22/2022 1.19 0.70 - 1.30 mg/dL Final   Creatinine, Ser  Date Value Ref Range Status  04/28/2024 1.06 0.76 - 1.27 mg/dL Final         Passed - HGB in normal range and within 360 days    Hemoglobin  Date Value Ref Range Status  04/28/2024 14.8 13.0 - 17.7 g/dL Final         Passed - HCT in normal range and within 360 days    Hematocrit  Date Value Ref Range Status  04/28/2024 43.7 37.5 - 51.0 % Final         Passed - PLT in normal range and within 360 days    Platelets  Date Value Ref Range Status  04/28/2024 181 150 - 450 x10E3/uL Final         Passed - WBC in normal range and within 360 days    WBC  Date Value Ref Range Status  04/28/2024 4.1 3.4 - 10.8 x10E3/uL Final  06/22/2022 4.6 3.8 - 10.8 Thousand/uL Final         Passed - eGFR is 30 or above and within 360 days    GFR, Est African American  Date Value Ref Range Status  08/21/2019 78 > OR = 60 mL/min/1.53m2 Final   GFR, Est Non African American  Date Value Ref Range Status  08/21/2019 67 > OR = 60 mL/min/1.33m2 Final   eGFR  Date Value Ref Range Status  04/28/2024 82 >59 mL/min/1.73 Final         Passed - Valid encounter within last 12 months    Recent Outpatient Visits           3 months ago Essential hypertension, benign   [facility] [place], [place] T, NP   4 months ago Essential hypertension, benign   [facility] [place], Melanie DASEN, NP

## 2024-11-04 ENCOUNTER — Other Ambulatory Visit: Payer: Self-pay | Admitting: Nurse Practitioner

## 2024-11-06 NOTE — Telephone Encounter (Signed)
 Requested Prescriptions  Pending Prescriptions Disp Refills   escitalopram  (LEXAPRO ) 20 MG tablet [Pharmacy Med Name: ESCITALOPRAM  20 MG TABLET] 30 tablet 6    Sig: TAKE 1 TABLET BY MOUTH EVERY DAY     Psychiatry:  Antidepressants - SSRI Passed - 11/06/2024  1:35 PM      Passed - Valid encounter within last 6 months    Recent Outpatient Visits           5 months ago Essential hypertension, benign   [facility] [place], [place] T, NP   6 months ago Essential hypertension, benign   [facility] [place], Melanie DASEN, NP

## 2024-11-28 NOTE — Patient Instructions (Signed)
Wegovy or Zepbound  Focus on DASH diet for high blood pressure or Mediterranean diet  Be Involved in Caring For Your Health:  Taking Medications When medications are taken as directed, they can greatly improve your health. But if they are not taken as prescribed, they may not work. In some cases, not taking them correctly can be harmful. To help ensure your treatment remains effective and safe, understand your medications and how to take them. Bring your medications to each visit for review by your provider.  Your lab results, notes, and after visit summary will be available on My Chart. We strongly encourage you to use this feature. If lab results are abnormal the clinic will contact you with the appropriate steps. If the clinic does not contact you assume the results are satisfactory. You can always view your results on My Chart. If you have questions regarding your health or results, please contact the clinic during office hours. You can also ask questions on My Chart.  We at [facility] are grateful that you chose us to provide your care. We strive to provide evidence-based and compassionate care and are always looking for feedback. If you get a survey from the clinic please complete this so we can hear your opinions.  Preventing High Cholesterol Cholesterol is a white, waxy substance similar to fat that the human body needs to help build cells. The liver makes all the cholesterol that a person's body needs. Having high cholesterol (hypercholesterolemia) increases your risk for heart disease and stroke. Extra or excess cholesterol comes from the food that you eat. High cholesterol can often be prevented with diet and lifestyle changes. If you already have high cholesterol, you can control it with diet, lifestyle changes, and medicines. How can high cholesterol affect me? If you have high cholesterol, fatty deposits (plaques) may build up on the walls of your blood vessels. The blood  vessels that carry blood away from your heart are called arteries. Plaques make the arteries narrower and stiffer. This in turn can: Restrict or block blood flow and cause blood clots to form. Increase your risk for heart attack and stroke. What can increase my risk for high cholesterol? This condition is more likely to develop in people who: Eat foods that are high in saturated fat or cholesterol. Saturated fat is mostly found in foods that come from animal sources. Are overweight. Are not getting enough exercise. Use products that contain nicotine or tobacco, such as cigarettes, e-cigarettes, and chewing tobacco. Have a family history of high cholesterol (familial hypercholesterolemia). What actions can I take to prevent this? Nutrition  Eat less saturated fat. Avoid trans fats (partially hydrogenated oils). These are often found in margarine and in some baked goods, fried foods, and snacks bought in packages. Avoid precooked or cured meat, such as bacon, sausages, or meat loaves. Avoid foods and drinks that have added sugars. Eat more fruits, vegetables, and whole grains. Choose healthy sources of protein, such as fish, poultry, lean cuts of red meat, beans, peas, lentils, and nuts. Choose healthy sources of fat, such as: Nuts. Vegetable oils, especially olive oil. Fish that have healthy fats, such as omega-3 fatty acids. These fish include mackerel or salmon. Lifestyle Lose weight if you are overweight. Maintaining a healthy body mass index (BMI) can help prevent or control high cholesterol. It can also lower your risk for diabetes and high blood pressure. Ask your health care provider to help you with a diet and exercise plan to lose   weight safely. Do not use any products that contain nicotine or tobacco. These products include cigarettes, chewing tobacco, and vaping devices, such as e-cigarettes. If you need help quitting, ask your health care provider. Alcohol use Do not drink  alcohol if: Your health care provider tells you not to drink. You are pregnant, may be pregnant, or are planning to become pregnant. If you drink alcohol: Limit how much you have to: 0-1 drink a day for women. 0-2 drinks a day for men. Know how much alcohol is in your drink. In the [place] one drink equals one 12 oz bottle of beer (355 mL), one 5 oz glass of wine (148 mL), or one 1 oz glass of hard liquor (44 mL). Activity  Get enough exercise. Do exercises as told by your health care provider. Each week, do at least 150 minutes of exercise that takes a medium level of effort (moderate-intensity exercise). This kind of exercise: Makes your heart beat faster while allowing you to still be able to talk. Can be done in short sessions several times a day or longer sessions a few times a week. For example, on 5 days each week, you could walk fast or ride your bike 3 times a day for 10 minutes each time. Medicines Your health care provider may recommend medicines to help lower cholesterol. This may be a medicine to lower the amount of cholesterol that your liver makes. You may need medicine if: Diet and lifestyle changes have not lowered your cholesterol enough. You have high cholesterol and other risk factors for heart disease or stroke. Take over-the-counter and prescription medicines only as told by your health care provider. General information Manage your risk factors for high cholesterol. Talk with your health care provider about all your risk factors and how to lower your risk. Manage other conditions that you have, such as diabetes or high blood pressure (hypertension). Have blood tests to check your cholesterol levels at regular points in time as told by your health care provider. Keep all follow-up visits. This is important. Where to find more information American Heart Association: www.heart.org National Heart, Lung, and Blood Institute: www.nhlbi.nih.gov Summary High cholesterol  increases your risk for heart disease and stroke. By keeping your cholesterol level low, you can reduce your risk for these conditions. High cholesterol can often be prevented with diet and lifestyle changes. Work with your health care provider to manage your risk factors, and have your blood tested regularly. This information is not intended to replace advice given to you by your health care provider. Make sure you discuss any questions you have with your health care provider. Document Revised: 06/08/2022 Document Reviewed: 01/09/2021 Elsevier Patient Education  2024 [employer]  

## 2024-11-30 ENCOUNTER — Encounter: Payer: Self-pay | Admitting: Nurse Practitioner

## 2024-11-30 ENCOUNTER — Ambulatory Visit: Admitting: Nurse Practitioner

## 2024-11-30 VITALS — BP 132/86 | HR 95 | Temp 98.2°F | Resp 17 | Ht 72.01 in | Wt 219.2 lb

## 2024-11-30 DIAGNOSIS — R7989 Other specified abnormal findings of blood chemistry: Secondary | ICD-10-CM

## 2024-11-30 DIAGNOSIS — R7303 Prediabetes: Secondary | ICD-10-CM | POA: Diagnosis not present

## 2024-11-30 DIAGNOSIS — I10 Essential (primary) hypertension: Secondary | ICD-10-CM | POA: Diagnosis not present

## 2024-11-30 DIAGNOSIS — E785 Hyperlipidemia, unspecified: Secondary | ICD-10-CM

## 2024-11-30 DIAGNOSIS — E781 Pure hyperglyceridemia: Secondary | ICD-10-CM | POA: Diagnosis not present

## 2024-11-30 DIAGNOSIS — F411 Generalized anxiety disorder: Secondary | ICD-10-CM | POA: Diagnosis not present

## 2024-11-30 LAB — BAYER DCA HB A1C WAIVED: HB A1C (BAYER DCA - WAIVED): 6.2 % — ABNORMAL HIGH (ref 4.8–5.6)

## 2024-11-30 LAB — MICROALBUMIN, URINE WAIVED
Creatinine, Urine Waived: 200 mg/dL (ref 10–300)
Microalb, Ur Waived: 10 mg/L (ref 0–19)
Microalb/Creat Ratio: <30 mg/g

## 2024-11-30 MED ORDER — ESCITALOPRAM OXALATE 20 MG PO TABS: 20.0000 mg | ORAL_TABLET | Freq: Every day | ORAL | 3 refills | Status: AC

## 2024-11-30 MED ORDER — LOSARTAN POTASSIUM-HCTZ 100-12.5 MG PO TABS: 1.0000 | ORAL_TABLET | Freq: Every day | ORAL | 3 refills | Status: AC

## 2024-11-30 MED ORDER — FENOFIBRATE 145 MG PO TABS: 145.0000 mg | ORAL_TABLET | Freq: Every day | ORAL | 3 refills | Status: AC

## 2024-11-30 MED ORDER — METFORMIN HCL ER 500 MG PO TB24: 500.0000 mg | ORAL_TABLET | Freq: Every day | ORAL | 4 refills | Status: AC

## 2024-11-30 MED ORDER — ICOSAPENT ETHYL 1 G PO CAPS: 2.0000 g | ORAL_CAPSULE | Freq: Two times a day (BID) | ORAL | 4 refills | Status: AC

## 2024-11-30 NOTE — Assessment & Plan Note (Signed)
 Chronic, ongoing. Recheck labs today and add additional medication as needed.  Did not tolerate any statins in past.  Discussed possibility of starting Repatha or Praluent if ongoing elevations and educated on this.  He is agreeable to this if needed.  Also discussed seeing urology due to low testosterone , ?if improving testosterone  levels would help triglycerides improve. He will alert PCP when ready to attend visit with them.

## 2024-11-30 NOTE — Assessment & Plan Note (Signed)
 Chronic and improved with medication changes.  BP slightly above goal today.  Continue Losartan -HCTZ 100-12.5 MG dosing, educated him on this and side effects to report. Discussed with patient today. Recommend he monitor BP at least a few mornings a week at home and document.  DASH diet at home.  Labs today: CMP and urine ALB. Consider addition of Amlodipine  at low dose next visit if remains a little above goal. Would avoid increasing HCTZ as does make him urinate more.

## 2024-11-30 NOTE — Assessment & Plan Note (Signed)
 Low, <300, on recent labs. He has not attended urology visit yet due to schedule. Discussed this with him today. Also discussed low testosterone , ?if improving testosterone  levels would help triglycerides improve. He will alert PCP when ready to attend visit with them and will place referral.

## 2024-11-30 NOTE — Assessment & Plan Note (Signed)
 Chronic, stable.  Denies SI/HI.  Will continue Lexapro at 20 MG, which works better for him.  Could consider reduction in future.  Overall tolerates 20 MG best for mood, less irritability.

## 2024-11-30 NOTE — Progress Notes (Signed)
 "  BP 132/86 (BP Location: Left Arm, Patient Position: Sitting, Cuff Size: Normal)   Pulse 95   Temp 98.2 F (36.8 C) (Oral)   Resp 17   Ht 6' 0.01 (1.829 m)   Wt 219 lb 3.4 oz (99.4 kg)   SpO2 100%   BMI 29.72 kg/m    Subjective:    Patient ID: Jesus Barber, male    DOB: [birth year], 59 y.o.   MRN: [id number]  HPI: Jesus Barber is a 59 y.o. male  Chief Complaint  Patient presents with   Follow-up    Here for routine blood work   Impaired Fasting Glucose Taking Metformin  XR 500 MG daily. HbA1C:  Lab Results  Component Value Date   HGBA1C 6.1 (H) 04/28/2024  Duration of elevated blood sugar:  Polydipsia: no Polyuria: no Weight change: no Visual disturbance: no Glucose Monitoring: no    Accucheck frequency: Not Checking    Fasting glucose:     Post prandial:  Diabetic Education: Not Completed Family history of diabetes: no   HYPERTENSION / HYPERLIPIDEMIA Takes Losartan -HCTZ 100-12.5 MG daily + Vascepa /Fenofibrate . He has tried multiple statins in past with reactions to these. Saw endo for a period due to elevation in triglycerides, last 04/28/21.   Satisfied with current treatment? yes Duration of hypertension: chronic BP monitoring frequency: not checking BP range:  BP medication side effects: no Duration of hyperlipidemia: chronic Cholesterol medication side effects: no Cholesterol supplements: fish oil  Medication compliance: good compliance Aspirin : no Recent stressors:  Recurrent headaches: no Visual changes: no Palpitations: no Dyspnea: no Chest pain: no Lower extremity edema: no Dizzy/lightheaded: no   DEPRESSION/ANXIETY Taking Lexapro .  History of low testosterone  on last labs, no current treatment. Mood status: stable Satisfied with current treatment?: yes Symptom severity: mild  Duration of current treatment : chronic Side effects: no Medication compliance: good compliance Psychotherapy/counseling: none Depressed mood:  no Anxious mood: no Anhedonia: no Significant weight loss or gain: no Insomnia: none Fatigue: yes Feelings of worthlessness or guilt: no Impaired concentration/indecisiveness: no Suicidal ideations: no Hopelessness: no Crying spells: no    11/30/2024    8:08 AM 05/29/2024    4:25 PM 04/28/2024    8:35 AM 10/29/2023    8:28 AM 09/12/2023    9:27 AM  Depression screen PHQ 2/9  Decreased Interest 0 0 0 0 0  Down, Depressed, Hopeless 0 0 0 0 0  PHQ - 2 Score 0 0 0 0 0  Altered sleeping 0 0 0 0 0  Tired, decreased energy 0 0 0 0 0  Change in appetite 0 0 0 0 0  Feeling bad or failure about yourself  0 0 0 0 0  Trouble concentrating 0 0 0 0 0  Moving slowly or fidgety/restless 0 0 0 0 0  Suicidal thoughts 0 0 0 0 0  PHQ-9 Score 0 0  0  0  0   Difficult doing work/chores Not difficult at all Not difficult at all Not difficult at all Not difficult at all Not difficult at all     Data saved with a previous flowsheet row definition       11/30/2024    8:09 AM 05/29/2024    4:26 PM 04/28/2024    8:35 AM 10/29/2023    8:28 AM  GAD 7 : Generalized Anxiety Score  Nervous, Anxious, on Edge 0 0 0 0  Control/stop worrying 0 0 0 0  Worry too much - different things 0 0 0  0  Trouble relaxing 0 0 0 0  Restless 0 0 0 0  Easily annoyed or irritable 0 0 0 0  Afraid - awful might happen 0 0 0 0  Total GAD 7 Score 0 0 0 0  Anxiety Difficulty Not difficult at all Not difficult at all Not difficult at all Not difficult at all   Relevant past medical, surgical, family and social history reviewed and updated as indicated. Interim medical history since our last visit reviewed. Allergies and medications reviewed and updated.  Review of Systems  Constitutional:  Positive for fatigue. Negative for activity change, diaphoresis and fever.  Respiratory:  Negative for cough, chest tightness, shortness of breath and wheezing.   Cardiovascular:  Negative for chest pain, palpitations and leg swelling.   Gastrointestinal: Negative.   Neurological: Negative.   Psychiatric/Behavioral: Negative.      Per HPI unless specifically indicated above     Objective:    BP 132/86 (BP Location: Left Arm, Patient Position: Sitting, Cuff Size: Normal)   Pulse 95   Temp 98.2 F (36.8 C) (Oral)   Resp 17   Ht 6' 0.01 (1.829 m)   Wt 219 lb 3.4 oz (99.4 kg)   SpO2 100%   BMI 29.72 kg/m   Wt Readings from Last 3 Encounters:  11/30/24 219 lb 3.4 oz (99.4 kg)  05/29/24 228 lb 9.6 oz (103.7 kg)  04/28/24 227 lb 12.8 oz (103.3 kg)    Physical Exam Vitals and nursing note reviewed.  Constitutional:      General: He is awake. He is not in acute distress.    Appearance: He is well-developed and well-groomed. He is not ill-appearing or toxic-appearing.  HENT:     Head: Normocephalic.     Right Ear: Hearing and external ear normal.     Left Ear: Hearing and external ear normal.  Eyes:     General: Lids are normal.     Extraocular Movements: Extraocular movements intact.     Conjunctiva/sclera: Conjunctivae normal.  Neck:     Thyroid: No thyromegaly.     Vascular: No carotid bruit.  Cardiovascular:     Rate and Rhythm: Normal rate and regular rhythm.     Heart sounds: Normal heart sounds. No murmur heard.    No gallop.  Pulmonary:     Effort: No accessory muscle usage or respiratory distress.     Breath sounds: Normal breath sounds.  Abdominal:     General: Bowel sounds are normal. There is no distension.     Palpations: Abdomen is soft.     Tenderness: There is no abdominal tenderness.  Musculoskeletal:     Cervical back: Full passive range of motion without pain.     Right lower leg: No edema.     Left lower leg: No edema.  Lymphadenopathy:     Cervical: No cervical adenopathy.  Skin:    General: Skin is warm.     Capillary Refill: Capillary refill takes less than 2 seconds.  Neurological:     Mental Status: He is alert and oriented to person, place, and time.     Deep Tendon  Reflexes: Reflexes are normal and symmetric.     Reflex Scores:      Brachioradialis reflexes are 2+ on the right side and 2+ on the left side.      Patellar reflexes are 2+ on the right side and 2+ on the left side. Psychiatric:        Attention and  Perception: Attention normal.        Mood and Affect: Mood normal.        Speech: Speech normal.        Behavior: Behavior normal. Behavior is cooperative.        Thought Content: Thought content normal.    Results for orders placed or performed in visit on 04/28/24  Bayer DCA Hb A1c Waived   Collection Time: 04/28/24  8:45 AM  Result Value Ref Range   HB A1C (BAYER DCA - WAIVED) 6.1 (H) 4.8 - 5.6 %  Microalbumin, Urine Waived   Collection Time: 04/28/24  8:45 AM  Result Value Ref Range   Microalb, Ur Waived 30 (H) 0 - 19 mg/L   Creatinine, Urine Waived 200 10 - 300 mg/dL   Microalb/Creat Ratio <30 <30 mg/g  CBC with Differential/Platelet   Collection Time: 04/28/24  8:47 AM  Result Value Ref Range   WBC 4.1 3.4 - 10.8 x10E3/uL   RBC 4.72 4.14 - 5.80 x10E6/uL   Hemoglobin 14.8 13.0 - 17.7 g/dL   Hematocrit 56.2 62.4 - 51.0 %   MCV 93 79 - 97 fL   MCH 31.4 26.6 - 33.0 pg   MCHC 33.9 31.5 - 35.7 g/dL   RDW 87.7 88.3 - 84.5 %   Platelets 181 150 - 450 x10E3/uL   Neutrophils 49 Not Estab. %   Lymphs 37 Not Estab. %   Monocytes 12 Not Estab. %   Eos 2 Not Estab. %   Basos 0 Not Estab. %   Neutrophils Absolute 2.0 1.4 - 7.0 x10E3/uL   Lymphocytes Absolute 1.5 0.7 - 3.1 x10E3/uL   Monocytes Absolute 0.5 0.1 - 0.9 x10E3/uL   EOS (ABSOLUTE) 0.1 0.0 - 0.4 x10E3/uL   Basophils Absolute 0.0 0.0 - 0.2 x10E3/uL   Immature Granulocytes 0 Not Estab. %   Immature Grans (Abs) 0.0 0.0 - 0.1 x10E3/uL  Comprehensive metabolic panel with GFR   Collection Time: 04/28/24  8:47 AM  Result Value Ref Range   Glucose 130 (H) 70 - 99 mg/dL   BUN 14 6 - 24 mg/dL   Creatinine, Ser 8.93 0.76 - 1.27 mg/dL   eGFR 82 >40 fO/fpw/8.26   BUN/Creatinine  Ratio 13 9 - 20   Sodium 139 134 - 144 mmol/L   Potassium 3.9 3.5 - 5.2 mmol/L   Chloride 101 96 - 106 mmol/L   CO2 15 (L) 20 - 29 mmol/L   Calcium  9.0 8.7 - 10.2 mg/dL   Total Protein 6.7 6.0 - 8.5 g/dL   Albumin 4.3 3.8 - 4.9 g/dL   Globulin, Total 2.4 1.5 - 4.5 g/dL   Bilirubin Total 0.5 0.0 - 1.2 mg/dL   Alkaline Phosphatase 76 44 - 121 IU/L   AST 29 0 - 40 IU/L   ALT 37 0 - 44 IU/L  Lipid Panel w/o Chol/HDL Ratio   Collection Time: 04/28/24  8:47 AM  Result Value Ref Range   Cholesterol, Total 357 (H) 100 - 199 mg/dL   Triglycerides 8,196 (HH) 0 - 149 mg/dL   HDL 23 (L) >60 mg/dL   VLDL Cholesterol Cal Comment (A) 5 - 40 mg/dL   LDL Chol Calc (NIH) Comment (A) 0 - 99 mg/dL   LDL CALC COMMENT: Comment   TSH   Collection Time: 04/28/24  8:47 AM  Result Value Ref Range   TSH 1.670 0.450 - 4.500 uIU/mL  Testosterone , free, total([facility])   Collection Time: 04/28/24  8:47 AM  Result Value Ref Range   Testosterone  142 (L) 264 - 916 ng/dL   Testosterone , Free 10.5 7.2 - 24.0 pg/mL   Sex Hormone Binding 8.3 (L) 19.3 - 76.4 nmol/L      Assessment & Plan:   Problem List Items Addressed This Visit       Cardiovascular and Mediastinum   Essential hypertension, benign - Primary (Chronic)   Chronic and improved with medication changes.  BP slightly above goal today.  Continue Losartan -HCTZ 100-12.5 MG dosing, educated him on this and side effects to report. Discussed with patient today. Recommend he monitor BP at least a few mornings a week at home and document.  DASH diet at home.  Labs today: CMP and urine ALB. Consider addition of Amlodipine  at low dose next visit if remains a little above goal. Would avoid increasing HCTZ as does make him urinate more.       Relevant Medications   losartan -hydrochlorothiazide (HYZAAR) 100-12.5 MG tablet   icosapent  Ethyl (VASCEPA ) 1 g capsule   fenofibrate  (TRICOR ) 145 MG tablet   Other Relevant Orders   Comprehensive metabolic  panel with GFR   Microalbumin, Urine Waived     Other   Hypertriglyceridemia (Chronic)   Chronic, ongoing. Recheck labs today and add additional medication as needed.  Did not tolerate any statins in past.  Discussed possibility of starting Repatha or Praluent if ongoing elevations and educated on this.  He is agreeable to this if needed.  Also discussed seeing urology due to low testosterone , ?if improving testosterone  levels would help triglycerides improve. He will alert PCP when ready to attend visit with them.      Relevant Medications   losartan -hydrochlorothiazide (HYZAAR) 100-12.5 MG tablet   icosapent  Ethyl (VASCEPA ) 1 g capsule   fenofibrate  (TRICOR ) 145 MG tablet   Other Relevant Orders   Comprehensive metabolic panel with GFR   Lipid Panel w/o Chol/HDL Ratio   Dyslipidemia (Chronic)   Chronic, ongoing.  Recheck labs today and add additional medication as needed.  Did not tolerate any statins in past.  Discussed possibility of starting Repatha or Praluent if ongoing elevations and educated on this.  He is agreeable to this if needed.  Also discussed seeing urology due to low testosterone , ?if improving testosterone  levels would help triglycerides improve. He will alert PCP when ready to attend visit with them.      Relevant Medications   icosapent  Ethyl (VASCEPA ) 1 g capsule   fenofibrate  (TRICOR ) 145 MG tablet   Other Relevant Orders   Comprehensive metabolic panel with GFR   Lipid Panel w/o Chol/HDL Ratio   Prediabetes   Ongoing.  A1c 6.1% June 2025 and is taking Metformin  XR daily. Focus on diet and regular exercise. Recheck level today.      Relevant Orders   Bayer DCA Hb A1c Waived   Low serum testosterone  level   Low, <300, on recent labs. He has not attended urology visit yet due to schedule. Discussed this with him today. Also discussed low testosterone , ?if improving testosterone  levels would help triglycerides improve. He will alert PCP when ready to attend visit  with them and will place referral.      GAD (generalized anxiety disorder)   Chronic, stable.  Denies SI/HI.  Will continue Lexapro  at 20 MG, which works better for him.  Could consider reduction in future.  Overall tolerates 20 MG best for mood, less irritability.      Relevant Medications   escitalopram  (LEXAPRO ) 20 MG tablet  Follow up plan: Return in about 6 months (around 05/30/2025) for Annual Physical.      "

## 2024-11-30 NOTE — Assessment & Plan Note (Signed)
 Ongoing.  A1c 6.1% June 2025 and is taking Metformin  XR daily. Focus on diet and regular exercise. Recheck level today.

## 2024-12-01 ENCOUNTER — Ambulatory Visit: Payer: Self-pay | Admitting: Nurse Practitioner

## 2024-12-01 LAB — COMPREHENSIVE METABOLIC PANEL WITH GFR
ALT: 39 IU/L (ref 0–44)
AST: 24 IU/L (ref 0–40)
Albumin: 4.6 g/dL (ref 3.8–4.9)
Alkaline Phosphatase: 65 IU/L (ref 47–123)
BUN/Creatinine Ratio: 15 (ref 9–20)
BUN: 17 mg/dL (ref 6–24)
Bilirubin Total: 0.6 mg/dL (ref 0.0–1.2)
CO2: 17 mmol/L — ABNORMAL LOW (ref 20–29)
Calcium: 9.6 mg/dL (ref 8.7–10.2)
Chloride: 102 mmol/L (ref 96–106)
Creatinine, Ser: 1.14 mg/dL (ref 0.76–1.27)
Globulin, Total: 2.3 g/dL (ref 1.5–4.5)
Glucose: 136 mg/dL — ABNORMAL HIGH (ref 70–99)
Potassium: 3.9 mmol/L (ref 3.5–5.2)
Sodium: 137 mmol/L (ref 134–144)
Total Protein: 6.9 g/dL (ref 6.0–8.5)
eGFR: 75 mL/min/1.73

## 2024-12-01 LAB — LIPID PANEL W/O CHOL/HDL RATIO
Cholesterol, Total: 277 mg/dL — ABNORMAL HIGH (ref 100–199)
HDL: 33 mg/dL — ABNORMAL LOW
Triglycerides: 873 mg/dL (ref 0–149)

## 2024-12-01 NOTE — Progress Notes (Signed)
 Contacted via MyChart  Good afternoon Jesus Barber, your labs have returned and triglycerides remain quite elevated, less than last check but still high. I would like you to consider adding on an injection like Praluent or Repatha which may help better control levels. Read about these and let me know your thoughts. Kidney function, creatinine and eGFR, remains normal, as is liver function, AST and ALT. Any questions? Keep being amazing!!  Thank you for allowing me to participate in your care.  I appreciate you. Kindest regards, Noelle Sease

## 2024-12-11 ENCOUNTER — Other Ambulatory Visit: Payer: Self-pay | Admitting: Nurse Practitioner

## 2024-12-11 NOTE — Telephone Encounter (Signed)
 Duplicate request, refilled 11/30/24.  Requested Prescriptions  Pending Prescriptions Disp Refills   fenofibrate  (TRICOR ) 145 MG tablet [Pharmacy Med Name: FENOFIBRATE  145 MG TABLET] 30 tablet 2    Sig: TAKE 1 TABLET BY MOUTH EVERY DAY     Cardiovascular:  Antilipid - Fibric Acid Derivatives Failed - 12/11/2024 11:50 AM      Failed - Lipid Panel in normal range within the last 12 months    Cholesterol, Total  Date Value Ref Range Status  11/30/2024 277 (H) 100 - 199 mg/dL Final   LDL Cholesterol (Calc)  Date Value Ref Range Status  06/22/2022  mg/dL (calc) Final    Comment:    . LDL cholesterol not calculated. Triglyceride levels greater than 400 mg/dL invalidate calculated LDL results. . Reference range: <100 . Desirable range <100 mg/dL for primary prevention;   <70 mg/dL for patients with CHD or diabetic patients  with > or = 2 CHD risk factors. SABRA LDL-C is now calculated using the Martin-Hopkins  calculation, which is a validated novel method providing  better accuracy than the Friedewald equation in the  estimation of LDL-C.  Gladis APPLETHWAITE et al. SANDREA. 7986;689(80): 2061-2068  (http://education.[facility].com/faq/FAQ164)    LDL Chol Calc (NIH)  Date Value Ref Range Status  11/30/2024 Comment (A) 0 - 99 mg/dL Final    Comment:    Triglyceride result indicated is too high for an accurate LDL cholesterol estimation.    HDL  Date Value Ref Range Status  11/30/2024 33 (L) >39 mg/dL Final   Triglycerides  Date Value Ref Range Status  11/30/2024 873 (HH) 0 - 149 mg/dL Final         Passed - ALT in normal range and within 360 days    ALT  Date Value Ref Range Status  11/30/2024 39 0 - 44 IU/L Final   ALT (SGPT) Piccolo, Waived  Date Value Ref Range Status  01/13/2016 51 (H) 10 - 47 U/L Final         Passed - AST in normal range and within 360 days    AST  Date Value Ref Range Status  11/30/2024 24 0 - 40 IU/L Final   AST (SGOT) Piccolo, Waived  Date  Value Ref Range Status  01/13/2016 46 (H) 11 - 38 U/L Final         Passed - Cr in normal range and within 360 days    Creat  Date Value Ref Range Status  06/22/2022 1.19 0.70 - 1.30 mg/dL Final   Creatinine, Ser  Date Value Ref Range Status  11/30/2024 1.14 0.76 - 1.27 mg/dL Final         Passed - HGB in normal range and within 360 days    Hemoglobin  Date Value Ref Range Status  04/28/2024 14.8 13.0 - 17.7 g/dL Final         Passed - HCT in normal range and within 360 days    Hematocrit  Date Value Ref Range Status  04/28/2024 43.7 37.5 - 51.0 % Final         Passed - PLT in normal range and within 360 days    Platelets  Date Value Ref Range Status  04/28/2024 181 150 - 450 x10E3/uL Final         Passed - WBC in normal range and within 360 days    WBC  Date Value Ref Range Status  04/28/2024 4.1 3.4 - 10.8 x10E3/uL Final  06/22/2022 4.6 3.8 - 10.8 Thousand/uL  Final         Passed - eGFR is 30 or above and within 360 days    GFR, Est African American  Date Value Ref Range Status  08/21/2019 78 > OR = 60 mL/min/1.18m2 Final   GFR, Est Non African American  Date Value Ref Range Status  08/21/2019 67 > OR = 60 mL/min/1.46m2 Final   eGFR  Date Value Ref Range Status  11/30/2024 75 >59 mL/min/1.73 Final         Passed - Valid encounter within last 12 months    Recent Outpatient Visits           1 week ago Essential hypertension, benign   [facility] [place], [place] T, NP   6 months ago Essential hypertension, benign   [facility] [address], [place] T, NP   7 months ago Essential hypertension, benign   [facility] [place], Melanie DASEN, NP

## 2025-06-01 ENCOUNTER — Encounter: Admitting: Nurse Practitioner
